# Patient Record
Sex: Female | Born: 1960 | Race: White | Hispanic: No | Smoking: Current every day smoker
Health system: Southern US, Community
[De-identification: ages and names within clinical notes are randomized; demographics above are authoritative.]

## PROBLEM LIST (undated history)

## (undated) DIAGNOSIS — I10 Essential (primary) hypertension: Secondary | ICD-10-CM

## (undated) DIAGNOSIS — J302 Other seasonal allergic rhinitis: Secondary | ICD-10-CM

## (undated) DIAGNOSIS — A0472 Enterocolitis due to Clostridium difficile, not specified as recurrent: Principal | ICD-10-CM

## (undated) DIAGNOSIS — F32A Depression, unspecified: Secondary | ICD-10-CM

## (undated) DIAGNOSIS — K219 Gastro-esophageal reflux disease without esophagitis: Secondary | ICD-10-CM

## (undated) DIAGNOSIS — F419 Anxiety disorder, unspecified: Secondary | ICD-10-CM

## (undated) DIAGNOSIS — F329 Major depressive disorder, single episode, unspecified: Secondary | ICD-10-CM

## (undated) HISTORY — PX: ABDOMINAL HYSTERECTOMY: SHX81

## (undated) HISTORY — DX: Anxiety disorder, unspecified: F41.9

## (undated) HISTORY — DX: Gastro-esophageal reflux disease without esophagitis: K21.9

## (undated) HISTORY — PX: TUBAL LIGATION: SHX77

## (undated) HISTORY — PX: CERVICAL CONIZATION W/BX: SHX1330

## (undated) HISTORY — DX: Depression, unspecified: F32.A

## (undated) HISTORY — DX: Enterocolitis due to Clostridium difficile, not specified as recurrent: A04.72

## (undated) HISTORY — PX: OTHER SURGICAL HISTORY: SHX169

## (undated) HISTORY — DX: Essential (primary) hypertension: I10

## (undated) HISTORY — DX: Other seasonal allergic rhinitis: J30.2

## (undated) HISTORY — DX: Major depressive disorder, single episode, unspecified: F32.9

---

## 1999-11-17 ENCOUNTER — Other Ambulatory Visit: Admission: RE | Admit: 1999-11-17 | Discharge: 1999-11-17 | Payer: Self-pay | Admitting: Obstetrics and Gynecology

## 2000-11-19 ENCOUNTER — Ambulatory Visit (HOSPITAL_COMMUNITY): Admission: RE | Admit: 2000-11-19 | Discharge: 2000-11-19 | Payer: Self-pay | Admitting: Urology

## 2000-11-19 ENCOUNTER — Encounter: Payer: Self-pay | Admitting: Urology

## 2001-04-10 ENCOUNTER — Emergency Department (HOSPITAL_COMMUNITY): Admission: EM | Admit: 2001-04-10 | Discharge: 2001-04-10 | Payer: Self-pay | Admitting: *Deleted

## 2001-04-10 ENCOUNTER — Encounter: Payer: Self-pay | Admitting: *Deleted

## 2001-04-14 ENCOUNTER — Encounter (HOSPITAL_COMMUNITY): Admission: RE | Admit: 2001-04-14 | Discharge: 2001-05-14 | Payer: Self-pay | Admitting: Family Medicine

## 2001-05-12 ENCOUNTER — Other Ambulatory Visit: Admission: RE | Admit: 2001-05-12 | Discharge: 2001-05-12 | Payer: Self-pay | Admitting: Obstetrics and Gynecology

## 2001-08-12 ENCOUNTER — Other Ambulatory Visit: Admission: RE | Admit: 2001-08-12 | Discharge: 2001-08-12 | Payer: Self-pay | Admitting: Obstetrics and Gynecology

## 2001-10-27 ENCOUNTER — Inpatient Hospital Stay (HOSPITAL_COMMUNITY): Admission: AD | Admit: 2001-10-27 | Discharge: 2001-10-27 | Payer: Self-pay | Admitting: Obstetrics & Gynecology

## 2002-01-10 ENCOUNTER — Other Ambulatory Visit: Admission: RE | Admit: 2002-01-10 | Discharge: 2002-01-10 | Payer: Self-pay | Admitting: Obstetrics and Gynecology

## 2002-04-14 ENCOUNTER — Other Ambulatory Visit: Admission: RE | Admit: 2002-04-14 | Discharge: 2002-04-14 | Payer: Self-pay | Admitting: Obstetrics and Gynecology

## 2002-10-30 ENCOUNTER — Other Ambulatory Visit: Admission: RE | Admit: 2002-10-30 | Discharge: 2002-10-30 | Payer: Self-pay | Admitting: Obstetrics and Gynecology

## 2004-05-06 ENCOUNTER — Ambulatory Visit (HOSPITAL_COMMUNITY): Admission: RE | Admit: 2004-05-06 | Discharge: 2004-05-06 | Payer: Self-pay | Admitting: Family Medicine

## 2004-05-08 ENCOUNTER — Ambulatory Visit (HOSPITAL_COMMUNITY): Admission: RE | Admit: 2004-05-08 | Discharge: 2004-05-08 | Payer: Self-pay | Admitting: Family Medicine

## 2004-05-26 ENCOUNTER — Other Ambulatory Visit: Admission: RE | Admit: 2004-05-26 | Discharge: 2004-05-26 | Payer: Self-pay | Admitting: Obstetrics and Gynecology

## 2005-03-30 ENCOUNTER — Inpatient Hospital Stay (HOSPITAL_COMMUNITY): Admission: EM | Admit: 2005-03-30 | Discharge: 2005-04-01 | Payer: Self-pay | Admitting: Emergency Medicine

## 2005-03-31 ENCOUNTER — Ambulatory Visit: Payer: Self-pay | Admitting: Internal Medicine

## 2005-04-21 ENCOUNTER — Ambulatory Visit (HOSPITAL_COMMUNITY): Admission: RE | Admit: 2005-04-21 | Discharge: 2005-04-21 | Payer: Self-pay | Admitting: Internal Medicine

## 2005-07-16 ENCOUNTER — Other Ambulatory Visit: Admission: RE | Admit: 2005-07-16 | Discharge: 2005-07-16 | Payer: Self-pay | Admitting: Obstetrics and Gynecology

## 2005-12-30 ENCOUNTER — Ambulatory Visit (HOSPITAL_COMMUNITY): Admission: RE | Admit: 2005-12-30 | Discharge: 2005-12-30 | Payer: Self-pay | Admitting: Family Medicine

## 2007-08-16 ENCOUNTER — Ambulatory Visit: Payer: Self-pay | Admitting: Internal Medicine

## 2007-09-02 ENCOUNTER — Ambulatory Visit: Payer: Self-pay | Admitting: Internal Medicine

## 2007-09-02 ENCOUNTER — Encounter: Payer: Self-pay | Admitting: Internal Medicine

## 2007-09-02 ENCOUNTER — Ambulatory Visit (HOSPITAL_COMMUNITY): Admission: RE | Admit: 2007-09-02 | Discharge: 2007-09-02 | Payer: Self-pay | Admitting: Internal Medicine

## 2007-09-02 HISTORY — PX: ESOPHAGOGASTRODUODENOSCOPY: SHX1529

## 2007-09-02 HISTORY — PX: COLONOSCOPY: SHX174

## 2007-10-14 ENCOUNTER — Ambulatory Visit (HOSPITAL_COMMUNITY): Admission: RE | Admit: 2007-10-14 | Discharge: 2007-10-14 | Payer: Self-pay | Admitting: Internal Medicine

## 2007-12-19 ENCOUNTER — Encounter (INDEPENDENT_AMBULATORY_CARE_PROVIDER_SITE_OTHER): Payer: Self-pay | Admitting: Obstetrics and Gynecology

## 2007-12-19 ENCOUNTER — Ambulatory Visit (HOSPITAL_COMMUNITY): Admission: RE | Admit: 2007-12-19 | Discharge: 2007-12-20 | Payer: Self-pay | Admitting: Obstetrics and Gynecology

## 2008-11-27 ENCOUNTER — Encounter: Payer: Self-pay | Admitting: Gastroenterology

## 2009-02-19 ENCOUNTER — Ambulatory Visit (HOSPITAL_COMMUNITY): Admission: RE | Admit: 2009-02-19 | Discharge: 2009-02-19 | Payer: Self-pay | Admitting: Family Medicine

## 2010-09-04 ENCOUNTER — Emergency Department (HOSPITAL_COMMUNITY): Payer: BC Managed Care – PPO

## 2010-09-04 ENCOUNTER — Emergency Department (HOSPITAL_COMMUNITY)
Admission: EM | Admit: 2010-09-04 | Discharge: 2010-09-04 | Disposition: A | Discharge status: Home / Self Care | Payer: BC Managed Care – PPO | Attending: Emergency Medicine | Admitting: Emergency Medicine

## 2010-09-04 DIAGNOSIS — M79609 Pain in unspecified limb: Secondary | ICD-10-CM

## 2010-09-04 DIAGNOSIS — I10 Essential (primary) hypertension: Secondary | ICD-10-CM | POA: Insufficient documentation

## 2010-09-04 DIAGNOSIS — R109 Unspecified abdominal pain: Secondary | ICD-10-CM | POA: Insufficient documentation

## 2010-09-04 DIAGNOSIS — Z90710 Acquired absence of both cervix and uterus: Secondary | ICD-10-CM | POA: Insufficient documentation

## 2010-09-04 LAB — URINALYSIS, ROUTINE W REFLEX MICROSCOPIC
Bilirubin Urine: NEGATIVE
Hgb urine dipstick: NEGATIVE
Nitrite: NEGATIVE
Specific Gravity, Urine: 1.022 (ref 1.005–1.030)
Urobilinogen, UA: 0.2 mg/dL (ref 0.0–1.0)
pH: 6 (ref 5.0–8.0)

## 2010-09-04 LAB — DIFFERENTIAL
Basophils Absolute: 0 10*3/uL (ref 0.0–0.1)
Basophils Relative: 0 % (ref 0–1)
Eosinophils Absolute: 0.1 10*3/uL (ref 0.0–0.7)
Monocytes Relative: 6 % (ref 3–12)
Neutro Abs: 6.5 10*3/uL (ref 1.7–7.7)
Neutrophils Relative %: 68 % (ref 43–77)

## 2010-09-04 LAB — COMPREHENSIVE METABOLIC PANEL
ALT: 20 U/L (ref 0–35)
AST: 27 U/L (ref 0–37)
Calcium: 9.4 mg/dL (ref 8.4–10.5)
Creatinine, Ser: 0.63 mg/dL (ref 0.4–1.2)
GFR calc Af Amer: >60 mL/min (ref >60)
GFR calc non Af Amer: >60 mL/min (ref >60)
Sodium: 131 mEq/L — ABNORMAL LOW (ref 135–145)
Total Protein: 7.2 g/dL (ref 6.0–8.3)

## 2010-09-04 LAB — PROTIME-INR
INR: 0.91 (ref 0.00–1.49)
Prothrombin Time: 12.5 seconds (ref 11.6–15.2)

## 2010-09-04 LAB — CBC
Hemoglobin: 12.3 g/dL (ref 12.0–15.0)
Platelets: 231 10*3/uL (ref 150–400)
RBC: 3.68 MIL/uL — ABNORMAL LOW (ref 3.87–5.11)
WBC: 9.6 10*3/uL (ref 4.0–10.5)

## 2010-09-04 LAB — LIPASE, BLOOD: Lipase: 15 U/L (ref 11–59)

## 2010-09-04 MED ORDER — IOHEXOL 300 MG/ML  SOLN
100.0000 mL | Freq: Once | INTRAMUSCULAR | Status: AC | PRN
  Administered 2010-09-04: 100 mL via INTRAVENOUS

## 2010-09-16 NOTE — Op Note (Signed)
[name]            ACCOUNT NO.:  [id number]   MEDICAL RECORD NO.:  [id number]          PATIENT TYPE:  AMB   LOCATION:  DAY                           FACILITY:  [facility]   PHYSICIAN:  R. Roetta Sessions, M.D. DATE OF BIRTH:  Dec 13, 1960   DATE OF PROCEDURE:  09/02/2007  DATE OF DISCHARGE:                               OPERATIVE REPORT   EGD with biopsy followed by ileocolonoscopy screening.   INDICATIONS FOR PROCEDURE:  A 50 year old lady with epigastric pain and  dyspeptic symptoms, recently had some black stools, temporarily related  to taking aspirin powders.  EGD is now being done.  Colonoscopy is now  being done as well.  She has had some diarrhea.  She has the threshold  age for colorectal cancer screening.  Anyway, this part has been  discussed with [name] previously.  Risks, benefits, alternatives,  and limitations have been reviewed.  Questions answered and she is  agreeable.  Please see Dr. __________  medical record.   PROCEDURE NOTE:  O2 saturation, blood pressure, pulse, and respirations  are monitored throughout the entirety of both procedures.   CONSCIOUS SEDATION:  Versed 8 mg IV and Demerol 200 mg IV in divided  doses.  Phenergan 12.5 mg IV __________  IV was pushed to augment  conscious sedation.  Cetacaine spray for topical oropharyngeal  anesthesia.   FINDINGS:  EGD examination of tubular esophagus revealed a 1 x 1 cm  inlet patch in the proximal esophagus.  Otherwise, esophageal mucosa  appeared normal.  EG junction easily traversed __________  stomach.  __________  gastric cavity was emptied and insufflated well with air.  A  thorough examination of the gastric mucosa including retroflexed view of  the proximal stomach, esophagogastric junction demonstrated only a  couple of pre-blocked antral erosions.  Otherwise, the gastric mucosa  appeared entirely normal.  Pylorus was patent and easily traversed.  Examination of the bulb and second portion  revealed no abnormalities.   THERAPEUTIC/DIAGNOSTIC MANEUVERS PERFORMED:  The 1 x 1 cm patch of  __________ biopsy x1 for __________.  The patient tolerated the  procedure well and was prepared for colonoscopy.  Digital rectal exam  revealed no abnormalities.  Endoscopic findings:  Prep was good.  Colon:  Colonic mucosa was surveyed from the rectosigmoid junction through the  left transverse, right colon, appendiceal orifice, ileocecal valve, and  cecum.  These structures were well seen and photographed for the record.  Terminal ileum was intubated to 10 cm.  From this level, the scope was  slowly withdrawn.  Previously mentioned mucosal surfaces were again  seen.  The colonic mucosa as well as terminal ileal mucosa appeared  normal.  Scope was pulled down to the rectum with examination of rectal  mucosa, including retroflexed view of the anal verge, demonstrated only  a couple of anal papilla.  The patient tolerated both procedures well  and was reacted in endoscopy.   IMPRESSION:  Esophagogastroduodenoscopy, solitary inlet patch, status  post biopsy otherwise normal esophagus, antral erosions, otherwise  normal stomach, D1 and D2.  Colonoscopy findings:  Anal papilla  otherwise  normal rectum, colon, terminal ileum.   RECOMMENDATIONS:  1. Continue Kapidex 60 mg orally daily. We will check H. pylori      serologies.  2. The patient was asked to refrain from taking aspirin powders.  3. Further recommendations to follow.      Jonathon Bellows, M.D.  Electronically Signed     RMR/MEDQ  D:  09/02/2007  T:  09/03/2007  Job:  [id number]   cc:   Patrica Duel, M.D.  Fax: [phone]

## 2010-09-16 NOTE — Op Note (Signed)
[name]            ACCOUNT NO.:  [id number]   MEDICAL RECORD NO.:  [id number]          PATIENT TYPE:  OIB   LOCATION:  9303                          FACILITY:  [facility]   PHYSICIAN:  Michelle L. Grewal, M.D.DATE OF BIRTH:  [birth year]   DATE OF PROCEDURE:  12/19/2007  DATE OF DISCHARGE:  12/20/2007                               OPERATIVE REPORT   PREOPERATIVE DIAGNOSES:  Pelvic pain and history of ovarian cyst.   POSTOPERATIVE DIAGNOSES:  Pelvic pain and history of ovarian cyst.   PROCEDURE:  Laparoscopic-assisted vaginal hysterectomy and bilateral  salpingo-oophorectomy.   SURGEON:  Michelle L. Vincente Poli, MD   ASSISTANT:  None.   ANESTHESIA:  General anesthesia.   ESTIMATED BLOOD LOSS:  200 mL.   COMPLICATIONS:  None.   PATHOLOGY:  Uterus, tubes, ovaries and cervix.   DRAINS:  Foley catheter   PROCEDURE:  The patient was taken to the operating room after informed  consent was obtained.  She was then prepped and draped in the usual  sterile fashion.  A Foley catheter was inserted and uterine manipulator  was inserted.  Attention was turned to the abdomen.  A small  infraumbilical incision was made and a Veress needle was inserted.  The  pneumoperitoneum was performed.  The Veress needle was removed.  An 11-  mm trocar was inserted.  The laparoscope was introduced through the  trocar sheath.  A 5-mm suprapubic port was placed under direct  visualization.  Exam of the abdomen and pelvis revealed no evidence of  endometriosis and no adhesions.  I then identified the ureters in their  normal location.  The ovary and tube were grasped on the right side.  The gyrus instrument was placed just beneath the skin and burned and  carried down to the round ligament, and there was no bleeding  whatsoever.  This was done on the left side in identical fashion.  I  noted that the ureters were well away from where my clamps were.  At  this point, there was no bleeding.  We removed  the laparoscope.  I  reduced the pneumoperitoneum.  I then placed weighted-speculum in the  vagina, made a circumferential incision around the cervix, made a  posterior incision and the posterior cul-de-sac and an incision in the  anterior cul-de-sac.  I placed curved Heaney clamps across the  uterosacral cardinal ligaments on either side.  Each pedicle was cut and  suture ligated using 0 Vicryl suture.  I walked my way up the broad  ligament on either side.  Each pedicle was cut and suture ligated using  0 Vicryl suture.  Once we reached the level of the fundus, the uterus  was retroflexed and removed and the remainder of the broad ligament was  clamped on either side.  The specimen was removed and sent to pathology.  The pedicles were secured using a suture ligature of 0 Vicryl suture.  Hemostasis was excellent.  The posterior cuff was closed from 3-9  o'clock in a running locked stitch using 0 Vicryl.  The cuff was then  closed completely anterior to posterior in  running locked stitch using 0  Vicryl suture.  At this point, I then changed my gloves and went back up  to the abdomen.  I have replaced the pneumoperitoneum, irrigated the  pelvis.  Hemostasis  was excellent.  I then removed the instruments from the abdominal  cavity, closed the infraumbilical incision with 3-0 Vicryl and then the  skin incisions were closed with Dermabond skin adhesive.  All sponge,  lap and instrument counts were correct x2.  The patient went to recovery  room in stable condition.      Michelle L. Vincente Poli, M.D.  Electronically Signed     MLG/MEDQ  D:  12/26/2007  T:  12/26/2007  Job:  [id number]

## 2010-09-16 NOTE — Consult Note (Signed)
[name]            ACCOUNT NO.:  [id number]   MEDICAL RECORD NO.:  [id number]         PATIENT TYPE:  AMB   LOCATION:  DAY                           FACILITY:  [facility]   PHYSICIAN:  R. Roetta Sessions, M.D. DATE OF BIRTH:  1960-10-06   DATE OF CONSULTATION:  08/16/2007  DATE OF DISCHARGE:                                 CONSULTATION   REFERRING PHYSICIAN:  Patrica Duel, M.D.   REASON FOR CONSULTATION:  Anemia, questionable colonoscopy.   HISTORY OF PRESENT ILLNESS:  Heather Davidson is a 50 year old Caucasian female  who presents today for further evaluation of anemia.  She presented for  physical exam a few weeks ago.  She noted at that time shehad  had  several-week history of diarrhea.  Workup included O&P, C. diff, and  culture, which were negative.  The patient states her diarrhea has now  resolved.  She is having regular bowel movements, eating Activia daily.  She complains mostly of abdominal bloating and gas.  She has  intermittent gas-like pains.  She has been taking Gas-X without results.  She has tried Zantac as well.  She denies any heartburn.  She does have  a lot of belching.  She also reports several-week history of black  stools.  She states she collected Hemoccults and was told they were  negative.  Her last black stool was a week ago.  She denies any Pepto,  Mylanta, Maalox, or iron therapy.  She does take BC powder once daily.  She takes about 5 per week.  She never had an EGD or colonoscopy.  She  denies dysphagia or odynophagia.  We saw her back in November 2006, when  she presented with bloody stools.  She had a CT at that time, which  revealed pancolitis.  Her stool studies were negative.  She was supposed  to follow up as an outpatient, but she never did.   CURRENT MEDICATIONS:  1. Cozaar 50 mg daily.  2. Generic Zyrtec daily.  3. Xanax 0.5 mg half tablet as needed.  4. B Complex daily.   ALLERGIES:  No known drug allergies.   PAST MEDICAL HISTORY:   She has been treated for precervical cancer with  conization twice.  She has a history of cystic ovaries, anxiety,  hypertension, and seasonal allergies.  She has had a cesarean section  and a bilateral tubal ligation.   FAMILY HISTORY:  Father had lung cancer due to asbestosis.  Mother has  COPD and asthma.  No family history of colorectal cancer.   SOCIAL HISTORY:  She is engaged to be married.  She has 1 child.  She  smokes a pack of cigarettes daily, consumes 2-3 glasses of wine daily.  She works at [employer].   REVIEW OF SYSTEMS:  See HPI for GI.  CONSTITUTIONAL:  Eight-pound weight  gain over the last 1 year.  CARDIOPULMONARY:  No chest pain, shortness  of breath, or cough.  GENITOURINARY:  Her menstrual cycles occur every  21 days with moderate bleeding.  No dysuria or hematuria.   PHYSICAL EXAM:  VITAL SIGNS:  Weight 172.5, height  5 feet 7, temp 98.3,  blood pressure 130/80, and pulse 84.  GENERAL:  Pleasant, well-nourished, well-developed, Caucasian female in  no acute distress.  SKIN:  Warm and dry.  No jaundice.  HEENT:  Sclerae nonicteric.  Oropharyngeal mucosa moist and pink.  No  lesions, erythema, or exudate.  No lymphadenopathy or thyromegaly.  CHEST:  Lungs are clear to auscultation.  CARDIAC:  Reveals regular rate and rhythm.  Normal S1, S2.  No murmurs,  rubs, or gallops.  ABDOMEN:  Positive bowel sounds.  Abdomen is soft.  She has mild  epigastric tenderness to deep palpation.  No rebound or guarding.  No  abdominal bruits or hernias.  No hepatosplenomegaly or masses.  LOWER EXTREMITIES:  No edema.   LABS:  Hemoglobin 11.5; hematocrit 36.1; MCV 105.9; platelets 356,000;  and WBC 7.2.  Sodium 137, potassium 4.4, BUN 9, creatinine 0.66, total  bilirubin 0.2, alkaline phosphatase 72, AST 16, ALT 11, albumin 3.7, and  TSH 2.330.  Ferritin 70, iron 58, TIBC 277, B12 539, and folate 14.1.   IMPRESSION:  The patient is a 50 year old lady with mild anemia as   above.  She reports negative Hemoccults recently.  She has a history of  black stools in the setting of BC powder use, epigastric pain, and  indigestion.  Cannot exclude possibility of peptic ulcer disease.  In  addition, she has a history of pancolitis, which presumably was from an  infectious source, although her stool studies were negative back in  2006.  Given that she is approaching the screening age and has mild  anemia with this abnormality seen previously, Dr. Jena Gauss is recommending  colonoscopy at this time as well too.   PLAN:  1. EGD and colonoscopy in the near future with Dr. Jena Gauss.  I discussed      risks, alternatives, and benefits with the patient.  She is      agreeable to proceed.  2. We will begin proton pump inhibitor therapy, samples provided to      take 1 daily.  She was given Kapidex 60 mg, #20.   Thank you for allowing me to take part in the care of this patient.      Tana Coast, P.AJonathon Bellows, M.D.  Electronically Signed    LL/MEDQ  D:  08/16/2007  T:  08/17/2007  Job:  [id number]   cc:   Patrica Duel, M.D.  Fax: [phone]

## 2010-09-19 NOTE — Consult Note (Signed)
[name]            ACCOUNT NO.:  [id number]   MEDICAL RECORD NO.:  [id number]          PATIENT TYPE:  INP   LOCATION:  A310                          FACILITY:  [facility]   PHYSICIAN:  R. Roetta Sessions, M.D. DATE OF BIRTH:  [birth year]   DATE OF CONSULTATION:  03/30/2005  DATE OF DISCHARGE:                                   CONSULTATION   REASON FOR CONSULTATION:  Colitis.   REQUESTING PHYSICIAN:  Corrie Mckusick, M.D.   HISTORY OF PRESENT ILLNESS:  The patient is a 50 year old Caucasian female  who presented to the emergency department for further evaluation of acute  onset nausea, vomiting and diarrhea with bloody stools. Three days prior to  admission, she began having diarrhea. Within 24 hours, she developed fever  with body aches. Diarrhea persisted for three days. She did have several  episodes of nausea and vomiting a couple of days prior to admission. She  went to see her family physician the day of admission. In the office, she  passed several bloody stools which she described as being only 1 or 2  tablespoons of blood at a time. On rectal examination by [name], nurse  practitioner, she was found to have gross blood per rectum according to the  patient. She was advised to go to the emergency department for further  evaluation. She has had several stools since which were watery red in  appearance. She complaints of abdominal cramping. No further vomiting. She  denies any close ill contacts but found out last night that seven people at  her place of employment also have nausea, vomiting and diarrhea. On a  chronic basis, she has some intermittent constipation and belching. Denies  any significant weight loss. No hematemesis or black stools.   In the ED, her temperature was 100.2. CT of the abdomen and pelvis  preliminary reveals diffuse colonic wall thickening with pericolonic edema  and inflammation consistent with pancolitis. Celiac, SMA, IMA all appeared  to  be patent. White count was 4,200 today. Hemoglobin was 13.5 today to  11.9. Potassium 3.1, sodium 129.   MEDICATIONS PRIOR TO ADMISSION:  1.  Motrin for fever.  2.  Imodium for diarrhea.  3.  Zyrtec D b.i.d.  4.  Flonase p.r.n.  5.  B complex.  6.  Prenatal care vitamin b.i.d.   ALLERGIES:  No known drug allergies.   PAST MEDICAL HISTORY:  1.  Seasonal allergies.  2.  History of precervical cancer status post conization.  3.  Bilateral tubal ligation.  4.  Cesarean section.   FAMILY HISTORY:  Negative for colorectal cancer or IBD.   SOCIAL HISTORY:  She is divorced. Has one child. She smokes one pack of  cigarettes daily. Consumes two to three glasses of wine each night. She  works as a Administrator for [employer].   REVIEW OF SYSTEMS:  See HPI for GI and constitutional. GENITOURINARY:  Last  menstrual cycle started March 27, 2005 which was a few days early for  her. CARDIOPULMONARY:  No chest pain or shortness of breath.   PHYSICAL  EXAMINATION:  VITAL SIGNS:  Temperature 98.5, T-max 100.2, pulse  93, respirations 20, blood pressure 98/65.  GENERAL:  Pleasant, well-developed, well-nourished Caucasian female in no  acute distress.  SKIN:  Warm and dry. No jaundice.  HEENT:  Conjunctivae are pink. Sclerae are nonicteric. Oropharyngeal mucosa  moist and pink. No lesions, erythema or exudate. No lymphadenopathy.  CHEST:  Lungs are clear to auscultation.  CARDIAC:  Reveals regular rate and rhythm. Normal S1 and S2. No murmurs,  rubs, or gallops.  ABDOMEN:  Positive bowel sounds. Soft, nondistended. She has mild epigastric  tenderness to deep palpation. In the right lower quadrant, she appears to be  somewhat more full than on the left side but no obvious mass palpated. No  rebound tenderness or guarding. No abdominal bruits or hernias.  EXTREMITIES:  No edema.   LABORATORY DATA:  As mentioned in HPI. In addition, platelets 162,000, BUN  3,  creatinine 0.8, glucose 94, calcium 7.8.   IMPRESSION:  The patient is a 50 year old Caucasian female who presents with  acute onset nausea, vomiting, diarrhea with bloody stools. CT reveals pan  colitis. Suspect this to be due to infectious etiology rather than IBD given  nature of acute onset.   RECOMMENDATIONS:  1.  Will change Cipro and Flagyl to oral as tolerated.  2.  Clear liquid diet.  3.  Diflucan for vaginal yeast infection.  4.  Levsin sublingual.  5.  Follow up stool studies.  6.  Further recommendations to follow after review of CT films.      Tana Coast, P.AJonathon Bellows, M.D.  Electronically Signed    LL/MEDQ  D:  03/31/2005  T:  03/31/2005  Job:  [id number]

## 2010-09-19 NOTE — H&P (Signed)
NAMECephus Richer            ACCOUNT NO.:  [id number]   MEDICAL RECORD NO.:  [id number]          PATIENT TYPE:  INP   LOCATION:  A310                          FACILITY:  [facility]   PHYSICIAN:  Corrie Mckusick, M.D.  DATE OF BIRTH:  11/13/1960   DATE OF ADMISSION:  03/30/2005  DATE OF DISCHARGE:  LH                                HISTORY & PHYSICAL   ADMITTING DIAGNOSIS:  Colitis.   HISTORY OF PRESENTING ILLNESS:  This is a 50 year old female with really no  significant past medical history who presented to the office earlier in the  day with bright red rectal bleeding, and mixed with yellowish stools.  She  had had the onset of nausea, vomiting, and diarrhea on the Friday prior,  which was 2 days prior to admission.  She had quite a bit of abdominal  cramping.  She had had fevers since Saturday with chills and sweats, as  well.  Her temperature in the office was 100.0.  Again, the diarrhea was  mostly yellowish stools, somewhat watery, but there was some scant bright  red blood.  She had also had quite a bit of achiness involved, as well.   She saw Melony Overly, our P.A. at the office, who discussed the case with  Dr. Sherwood Gambler, and he sent her to the emergency department for further  evaluation.  She also had had decreased p.o. intake, being able to hold down  just scant liquids.  She was mildly hypertension at 96/60 in the office,  which was slightly lower than her baseline blood pressure.  Otherwise,  review of systems is really negative.  She has had no recent travel, no  recent antibiotics, no recent fresh foods, no recent fresh seafood, and has  no significant past medical history as stated above.   REVIEW OF SYSTEMS:  GU and RESPIRATORY review of systems are all negative.   PAST MEDICAL HISTORY:  1.  History of benign tremor.  2.  Seasonal allergies.   PAST SURGICAL HISTORY:  1.  She had a D&C many years ago.  2.  Cesarean section.   SOCIAL HISTORY:  She smokes a pack a  day and 2 glasses of red wine every  other day.   FAMILY HISTORY:  Significant for COPD, ovarian carcinoma in her grandmother.  Otherwise, no significant history of colon pathology.   PHYSICAL EXAMINATION ON ADMISSION:  VITAL SIGNS:  Temperature was 100, blood  pressure 96/60, respirations 18, pulse 82, weight is 156.  GENERAL:  When I saw her in the ER, she was pleasant, talkative and joking,  but clearly not feeling overall her normal chipper self.  HEENT:  Nasopharynx was clear with slightly dry mucous membranes.  NECK:  Supple.  No lymphadenopathy.  CHEST:  Clear to auscultation bilaterally.  CARDIOVASCULAR:  Rate and rhythm normal.  S1 and S2.  No murmurs.  ABDOMEN:  Bowel sounds positive.  There is tenderness in the periumbilical  area.  There is really no rebound or guarding.  No masses palpated.  EXTREMITIES:  No edema.  No tenting of the skin.   CT  of the abdomen showed reportedly colitis by Dr. Rosalia Hammers.  The report is not  officially read yet.  It showed no other pathology.   LABORATORY DATA:  Please see flow sheet for details.   ASSESSMENT AND PLAN:  A 50 year old female with really insignificant past  medical history who presents with most likely a viral gastroenteritis and  colitis.   PLAN:  1.  Admit for IV fluids and aggressive rehydration.  2.  Replace potassium.  3.  She was given Flagyl and Cipro in the emergency department.  These will      be continued for now at 400 of Cipro IV q.12h. and Flagyl 500 mg IV      q.8h.  4.  Will go ahead and consult GI, as she will need a colonoscopy, as she is      nearing the age of 38 anyway.  5.  Will also go ahead and obtain stool for ovum, parasites, cultures and      Clostridium difficile.  6.  Continue to follow closely, and hold her NPO for now.      Corrie Mckusick, M.D.  Electronically Signed     JCG/MEDQ  D:  03/31/2005  T:  03/31/2005  Job:  [id number]

## 2010-09-19 NOTE — Discharge Summary (Signed)
NAMECephus Richer            ACCOUNT NO.:  [id number]   MEDICAL RECORD NO.:  [id number]          PATIENT TYPE:  INP   LOCATION:  A310                          FACILITY:  [facility]   PHYSICIAN:  Corrie Mckusick, M.D.  DATE OF BIRTH:  08-26-1960   DATE OF ADMISSION:  03/30/2005  DATE OF DISCHARGE:  11/29/2006LH                                 DISCHARGE SUMMARY   HISTORY OF PRESENT ILLNESS/PAST MEDICAL HISTORY:  Please see H&P.   HOSPITAL COURSE:  A 50 year old female with no significant past medical  history presented with viral gastroenteritis and colitis.  She is admitted  for IV fluids and aggressive rehydration.  Potassium was replaced.  She was  given Flagyl and Cipro in the emergency department, and we continued that.  GI was consulted.  Stool was obtained for ova and parasites, cultures and C.  difficile.  Please see Dr. Luvenia Starch note for details and agree to their  assessment.   The day after admission, the patient felt remarkably better.  Antibiotics  were continued.  We kept it n.p.o. until GI saw the patient and they decided  to re-feed her.   The following day, on the day of discharge, which was November 29, the  patient had improvement of diarrhea.  Stool studies were negative.  She had  improved quite a bit and was ready for discharge home.  Dr. Jena Gauss agreed  that she should be discharged home and was set up for outpatient total  colonoscopy.   CONDITION ON DISCHARGE:  Improved and stable.   PHYSICAL EXAMINATION:  Please see Dr. Sharyon Medicus note on the day of discharge.   DISCHARGE MEDICATIONS:  1.  Cipro 500 b.i.d. for an additional five days.  2.  Flagyl 500 t.i.d. for five days.  3.  Diflucan 100 mg once.   FOLLOWUP:  She is to follow up for pelvic ultrasound on January 9 at 10 a.m.  She is also to follow up with Dr.  Jena Gauss as scheduled and with [facility] in two weeks.      Corrie Mckusick, M.D.  Electronically Signed     JCG/MEDQ  D:  05/20/2005   T:  05/20/2005  Job:  [id number]

## 2010-09-30 ENCOUNTER — Ambulatory Visit: Payer: BC Managed Care – PPO | Admitting: Gastroenterology

## 2010-09-30 ENCOUNTER — Encounter: Payer: Self-pay | Admitting: Gastroenterology

## 2010-09-30 ENCOUNTER — Ambulatory Visit (INDEPENDENT_AMBULATORY_CARE_PROVIDER_SITE_OTHER): Payer: BC Managed Care – PPO | Admitting: Gastroenterology

## 2010-09-30 VITALS — BP 133/87 | HR 98 | Temp 97.9°F | Ht 67.0 in | Wt 161.2 lb

## 2010-09-30 DIAGNOSIS — K219 Gastro-esophageal reflux disease without esophagitis: Secondary | ICD-10-CM

## 2010-09-30 DIAGNOSIS — R143 Flatulence: Secondary | ICD-10-CM

## 2010-09-30 DIAGNOSIS — R112 Nausea with vomiting, unspecified: Secondary | ICD-10-CM

## 2010-09-30 DIAGNOSIS — R141 Gas pain: Secondary | ICD-10-CM

## 2010-09-30 DIAGNOSIS — R140 Abdominal distension (gaseous): Secondary | ICD-10-CM

## 2010-09-30 DIAGNOSIS — K5900 Constipation, unspecified: Secondary | ICD-10-CM

## 2010-09-30 DIAGNOSIS — R142 Eructation: Secondary | ICD-10-CM

## 2010-09-30 LAB — IFOBT (OCCULT BLOOD): IFOBT: NEGATIVE

## 2010-09-30 MED ORDER — ALIGN 4 MG PO CAPS: 4.0000 mg | ORAL_CAPSULE | Freq: Every day | ORAL | Status: DC

## 2010-09-30 NOTE — Assessment & Plan Note (Signed)
Do not use hyoscyamine at this point since she is not having abdominal cramps or diarrhea. It may only worsen her constipation.

## 2010-09-30 NOTE — Patient Instructions (Signed)
Take Align one daily for four weeks. Samples provided. Take Miralax 17 grams each night (if no good bowel movement that day). You can buy over the counter. Call if two weeks with progress report. If no better, then we will consider abdominal ultrasound to check your gallbladder.  Collect stool specimen and return to our office.

## 2010-09-30 NOTE — Assessment & Plan Note (Signed)
Recent change in PPI therapy. Continue current regimen.

## 2010-09-30 NOTE — Assessment & Plan Note (Signed)
Abdominal bloating, abdominal distention without any significant findings on CT recently. She did have prominent amount of stool in the colon although subjectively she states she's not been constipated. Colonoscopy 3 years ago. Some modest improvement with switch of PPI recently.  Abdominal bloating/gas she provided to the patient. Will begin Align 4 mg daily. #30 samples provided. Admits to some recent constipation over the weekend. Start MiraLax 17 g daily to take each night at bedtime if she's not had a good bowel movement that day. We'll check ifobt. Progress report in 2 weeks. If still symptomatic at that time and having good bowel movements then consider workup of gallbladder.

## 2010-09-30 NOTE — Progress Notes (Signed)
Primary Care Physician:  Elfredia Nevins, MD  Primary Gastroenterologist:  Roetta Sessions, MD  Chief Complaint  Patient presents with  . Abdominal Pain    HPI:  Heather Davidson is a 50 y.o. female here for further evaluation of abdominal pain. She states all her symptoms began back in March when she developed gross hematuria and bladder infection. Saw OB/GYN recently, microscopic hematuria but no evidence of persisting urinary tract infection. She began having bruising throughout upper abdomen, which was spontaneous. No evidence of injury. Platelet count was normal at that time. Her GYN changed her omeprazole to pantoprazole BID for the abdominal bloating which helped swelling initially.   In the interim however she ended up at the emergency Department at [facility].she had a CT that and pelvis with IV and oral contrast which was unremarkable except for large amount of stool throughout the colon. There was nothing seen to explain the upper abdominal bruising. Lab work showed normal PT, PTT, lipase, LFTs, creatinine, CBC. Her urinalysis was negative. Sodium was slightly low at 131.   Now she states the bruising is all gone. She continues to have diffuse bloating and swelling associated with nausea. Two weeks ago, vomiting few times. Extremely constipated this past weekend. Prior to CT, four large BMs. Lately stools very regular, every day (for past 6-8 months). Little brbpr in stool and on tissue paper. Never have heartburn. More belching, but better with pantoprazole. No dysphagia. No odynophagia.    Current Outpatient Prescriptions  Medication Sig Dispense Refill  . citalopram (CELEXA) 20 MG tablet Take 20 mg by mouth daily.        . cyclobenzaprine (FLEXERIL) 10 MG tablet Take 10 mg by mouth 3 (three) times daily as needed.        Marland Kitchen estradiol (ESTRACE) 1 MG tablet Take 1 mg by mouth daily.        . fexofenadine (ALLEGRA) 180 MG tablet Take 180 mg by mouth daily.        . fluticasone  (VERAMYST) 27.5 MCG/SPRAY nasal spray Place 2 sprays into the nose daily.        . hydrochlorothiazide (,MICROZIDE/HYDRODIURIL,) 12.5 MG capsule Take 12.5 mg by mouth daily.        . hyoscyamine (LEVSINEX) 0.375 MG 12 hr capsule Take 0.375 mg by mouth 2 (two) times daily as needed.        Marland Kitchen losartan (COZAAR) 100 MG tablet Take 100 mg by mouth daily.        . pantoprazole (PROTONIX) 40 MG tablet Take 40 mg by mouth daily.        . traMADol (ULTRAM) 50 MG tablet Take 50 mg by mouth every 6 (six) hours as needed.        . Probiotic Product (ALIGN) 4 MG CAPS Take 4 mg by mouth daily.  30 capsule  0    Allergies as of 09/30/2010 - Review Complete 09/30/2010  Allergen Reaction Noted  . Ciprofloxacin  09/30/2010    Past Medical History  Diagnosis Date  . Anxiety   . Depression   . Seasonal allergies   . HTN (hypertension)     Past Surgical History  Procedure Date  . Cesarean section   . Tubal ligation   . Complete hysterectomy   . Cervical conization w/bx     for precervical cancer in remote past  . Esophagogastroduodenoscopy 09/2007    solitary inlet patch (bx neg), antral erosions  . Colonoscopy 09/2007    anal papilla, normal  colon and terminal ileum    Family History  Problem Relation Age of Onset  . Lung cancer Father     asbestosis  . COPD Mother   . Colon cancer Neg Hx     History   Social History  . Marital Status: Married    Spouse Name: N/A    Number of Children: N/A  . Years of Education: N/A   Occupational History  . [employer]    Social History Main Topics  . Smoking status: Current Everyday Smoker -- 1.0 packs/day    Types: Cigarettes  . Smokeless tobacco: Not on file  . Alcohol Use: Yes     3 glass of red wine a night  . Drug Use: No  . Sexually Active: Not on file   Other Topics Concern  . Not on file   Social History Narrative  . No narrative on file      ROS:  General: Negative for anorexia, weight loss, fever, chills, fatigue,  weakness. Eyes: Negative for vision changes.  ENT: Negative for hoarseness, difficulty swallowing , nasal congestion. CV: Negative for chest pain, angina, palpitations, dyspnea on exertion, peripheral edema.  Respiratory: Negative for dyspnea at rest, dyspnea on exertion, cough, sputum, wheezing.  GI: See history of present illness. GU:  Negative for dysuria, hematuria, urinary incontinence, urinary frequency, nocturnal urination.  MS: Negative for joint pain, low back pain.  Derm: Negative for rash or itching.  Neuro: Negative for weakness, abnormal sensation, seizure, frequent headaches, memory loss, confusion.  Psych: Negative for anxiety, depression, suicidal ideation, hallucinations.  Endo: Negative for unusual weight change.  Heme: See HPI. Allergy: Negative for rash or hives.    Physical Examination:  BP 133/87  Pulse 98  Temp(Src) 97.9 F (36.6 C) (Temporal)  Ht 5\' 7"  (1.702 m)  Wt 161 lb 3.2 oz (73.12 kg)  BMI 25.25 kg/m2   General: Well-nourished, well-developed in no acute distress.  Head: Normocephalic, atraumatic.   Eyes: Conjunctiva pink, no icterus. Mouth: Oropharyngeal mucosa moist and pink , no lesions erythema or exudate. Neck: Supple without thyromegaly, masses, or lymphadenopathy.  Lungs: Clear to auscultation bilaterally.  Heart: Regular rate and rhythm, no murmurs rubs or gallops.  Abdomen: Bowel sounds are normal, nontender, slightly distended, no obvious fluid, no bruising present at this time,  no hepatosplenomegaly or masses, no abdominal bruits or    hernia , no rebound or guarding.   Extremities: No lower extremity edema.  Neuro: Alert and oriented x 4 , grossly normal neurologically.  Skin: Warm and dry, no rash or jaundice.   Psych: Alert and cooperative, normal mood and affect.

## 2010-09-30 NOTE — Assessment & Plan Note (Signed)
Recent episode of vomiting associated with abdominal bloating. Please refer to assessment and plan for abdominal bloating.

## 2010-10-01 NOTE — Progress Notes (Signed)
Please let pt know her ifobt was negative. Plan as outlined at time of OV on 09/30/10.

## 2010-10-01 NOTE — Progress Notes (Signed)
LM on personal voicemail with results

## 2010-10-13 ENCOUNTER — Telehealth: Payer: Self-pay | Admitting: Gastroenterology

## 2010-10-13 MED ORDER — ONDANSETRON 4 MG PO TBDP: 4.0000 mg | ORAL_TABLET | ORAL | Status: AC | PRN

## 2010-10-13 NOTE — Telephone Encounter (Signed)
Pt called this morning, she states she is no better- still having abdominal pain associated with nausea and vomiting- please advise on what she should do next- She can be reached at [phone]

## 2010-10-13 NOTE — Telephone Encounter (Addendum)
Please let pt know, we can call in some Zofran 4mg  ODT, dissolve one on tongue every 4 hours as needed for n/v. #20, Zero refills.  Proceed with abd u/s, Dx abd pain, n/v.    RX sent electronically.

## 2010-10-13 NOTE — Telephone Encounter (Signed)
Pt aware, ok to schedule U/S, pt stated she could be reached at her work number or her cell number- [phone]

## 2010-10-14 ENCOUNTER — Other Ambulatory Visit: Payer: Self-pay | Admitting: Internal Medicine

## 2010-10-14 DIAGNOSIS — R112 Nausea with vomiting, unspecified: Secondary | ICD-10-CM

## 2010-10-14 NOTE — Telephone Encounter (Signed)
Pt is scheduled for abdominal U/S on 10/15/10-

## 2010-10-15 ENCOUNTER — Ambulatory Visit (HOSPITAL_COMMUNITY)
Admission: RE | Admit: 2010-10-15 | Discharge: 2010-10-15 | Disposition: A | Discharge status: Home / Self Care | Payer: BC Managed Care – PPO | Source: Ambulatory Visit | Attending: Internal Medicine | Admitting: Internal Medicine

## 2010-10-15 ENCOUNTER — Other Ambulatory Visit (HOSPITAL_COMMUNITY): Payer: BC Managed Care – PPO

## 2010-10-15 DIAGNOSIS — R112 Nausea with vomiting, unspecified: Secondary | ICD-10-CM | POA: Insufficient documentation

## 2010-10-15 DIAGNOSIS — R109 Unspecified abdominal pain: Secondary | ICD-10-CM | POA: Insufficient documentation

## 2010-10-17 ENCOUNTER — Telehealth: Payer: Self-pay | Admitting: Gastroenterology

## 2010-10-17 DIAGNOSIS — R1110 Vomiting, unspecified: Secondary | ICD-10-CM

## 2010-10-17 NOTE — Telephone Encounter (Signed)
Patient actually had abd u/s. For whatever reason, it did not get forwarded to me. Please let her know it was negative.  HIDA with [facility] to finish w/u for gb. Dx Abd pain, vomiting.

## 2010-10-17 NOTE — Telephone Encounter (Signed)
Message copied by Tiffany Kocher on Fri Oct 17, 2010 11:05 AM ------      Message from: Myra Rude      Created: Fri Oct 17, 2010  8:24 AM       Pt called requesting results from ct

## 2010-10-17 NOTE — Telephone Encounter (Signed)
Tried to call pts work [phone]. Left message on voicemail that U/S was normal and Crystal would be calling her to schedule [facility]

## 2010-10-20 NOTE — Telephone Encounter (Signed)
Pt is scheduled for Hida Scan 10/22/10@8 :00am. Pt is aware of appt.

## 2010-10-22 ENCOUNTER — Telehealth: Payer: Self-pay | Admitting: Gastroenterology

## 2010-10-22 ENCOUNTER — Encounter (HOSPITAL_COMMUNITY)
Admission: RE | Admit: 2010-10-22 | Discharge: 2010-10-22 | Disposition: A | Discharge status: Home / Self Care | Payer: BC Managed Care – PPO | Source: Ambulatory Visit | Attending: Gastroenterology | Admitting: Gastroenterology

## 2010-10-22 DIAGNOSIS — R109 Unspecified abdominal pain: Secondary | ICD-10-CM | POA: Insufficient documentation

## 2010-10-22 DIAGNOSIS — R112 Nausea with vomiting, unspecified: Secondary | ICD-10-CM | POA: Insufficient documentation

## 2010-10-22 DIAGNOSIS — R1110 Vomiting, unspecified: Secondary | ICD-10-CM

## 2010-10-22 MED ORDER — REZYST IM PO CHEW: 1.0000 | CHEWABLE_TABLET | Freq: Every day | ORAL | Status: DC

## 2010-10-22 MED ORDER — TECHNETIUM TC 99M MEBROFENIN IV KIT
5.0000 | PACK | Freq: Once | INTRAVENOUS | Status: AC | PRN
  Administered 2010-10-22: 4.8 via INTRAVENOUS

## 2010-10-23 NOTE — Telephone Encounter (Signed)
Sent RX for Rezyst to pharmacy. Should be covered by [employer].

## 2010-11-06 ENCOUNTER — Other Ambulatory Visit (HOSPITAL_COMMUNITY): Payer: Self-pay | Admitting: Family Medicine

## 2010-11-06 ENCOUNTER — Ambulatory Visit (HOSPITAL_COMMUNITY)
Admission: RE | Admit: 2010-11-06 | Discharge: 2010-11-06 | Disposition: A | Discharge status: Home / Self Care | Payer: BC Managed Care – PPO | Source: Ambulatory Visit | Attending: Family Medicine | Admitting: Family Medicine

## 2010-11-06 DIAGNOSIS — M542 Cervicalgia: Secondary | ICD-10-CM | POA: Insufficient documentation

## 2010-11-06 DIAGNOSIS — M47812 Spondylosis without myelopathy or radiculopathy, cervical region: Secondary | ICD-10-CM | POA: Insufficient documentation

## 2010-11-06 DIAGNOSIS — M5030 Other cervical disc degeneration, unspecified cervical region: Secondary | ICD-10-CM

## 2010-11-07 ENCOUNTER — Other Ambulatory Visit (HOSPITAL_COMMUNITY): Payer: Self-pay | Admitting: Family Medicine

## 2010-11-07 DIAGNOSIS — M542 Cervicalgia: Secondary | ICD-10-CM

## 2010-11-10 ENCOUNTER — Ambulatory Visit (HOSPITAL_COMMUNITY)
Admission: RE | Admit: 2010-11-10 | Discharge: 2010-11-10 | Disposition: A | Discharge status: Home / Self Care | Payer: BC Managed Care – PPO | Source: Ambulatory Visit | Attending: Family Medicine | Admitting: Family Medicine

## 2010-11-10 DIAGNOSIS — M79609 Pain in unspecified limb: Secondary | ICD-10-CM | POA: Insufficient documentation

## 2010-11-10 DIAGNOSIS — M5380 Other specified dorsopathies, site unspecified: Secondary | ICD-10-CM | POA: Insufficient documentation

## 2010-11-10 DIAGNOSIS — M5030 Other cervical disc degeneration, unspecified cervical region: Secondary | ICD-10-CM | POA: Insufficient documentation

## 2010-11-10 DIAGNOSIS — M542 Cervicalgia: Secondary | ICD-10-CM | POA: Insufficient documentation

## 2010-11-25 ENCOUNTER — Other Ambulatory Visit: Payer: Self-pay

## 2010-11-25 MED ORDER — REZYST IM PO CHEW: 1.0000 | CHEWABLE_TABLET | Freq: Every day | ORAL | Status: DC

## 2010-11-28 ENCOUNTER — Ambulatory Visit (INDEPENDENT_AMBULATORY_CARE_PROVIDER_SITE_OTHER): Payer: BC Managed Care – PPO | Admitting: Urology

## 2010-11-28 DIAGNOSIS — R3129 Other microscopic hematuria: Secondary | ICD-10-CM

## 2010-12-03 ENCOUNTER — Ambulatory Visit (INDEPENDENT_AMBULATORY_CARE_PROVIDER_SITE_OTHER): Payer: BC Managed Care – PPO | Admitting: Gastroenterology

## 2010-12-03 ENCOUNTER — Encounter: Payer: Self-pay | Admitting: Gastroenterology

## 2010-12-03 DIAGNOSIS — K5900 Constipation, unspecified: Secondary | ICD-10-CM

## 2010-12-03 DIAGNOSIS — K219 Gastro-esophageal reflux disease without esophagitis: Secondary | ICD-10-CM

## 2010-12-03 DIAGNOSIS — R140 Abdominal distension (gaseous): Secondary | ICD-10-CM

## 2010-12-03 DIAGNOSIS — R143 Flatulence: Secondary | ICD-10-CM

## 2010-12-03 DIAGNOSIS — R141 Gas pain: Secondary | ICD-10-CM

## 2010-12-03 DIAGNOSIS — A0472 Enterocolitis due to Clostridium difficile, not specified as recurrent: Secondary | ICD-10-CM

## 2010-12-03 HISTORY — DX: Enterocolitis due to Clostridium difficile, not specified as recurrent: A04.72

## 2010-12-03 MED ORDER — POLYETHYLENE GLYCOL 3350 17 GM/SCOOP PO POWD: 17.0000 g | Freq: Every day | ORAL | Status: AC

## 2010-12-03 MED ORDER — PANTOPRAZOLE SODIUM 40 MG PO TBEC: 40.0000 mg | DELAYED_RELEASE_TABLET | Freq: Two times a day (BID) | ORAL | Status: DC

## 2010-12-03 MED ORDER — REZYST IM PO CHEW: 1.0000 | CHEWABLE_TABLET | Freq: Every day | ORAL | Status: DC

## 2010-12-03 NOTE — Assessment & Plan Note (Signed)
Continue probiotics

## 2010-12-03 NOTE — Progress Notes (Signed)
Primary Care Physician: Colette Ribas, MD  Primary Gastroenterologist:  Roetta Sessions, MD   Chief Complaint  Patient presents with  . Follow-up    has been having diarrhea    HPI: Heather Davidson is a 50 y.o. female here for follow up of constipation, abdominal pain, abdominal bloating. Doing much better now, except extreme constipation last week and took multiple OTC laxatives. Now with six days of loose stool but resolving. Taking a Miralax break for a few days. Rezyst helping bloating. Pantoprazole helping nausea as well. [facility] last week, cystoscopy. No cancer, some polyps, felt hematuria due to bad UTI. No blood in stool on ifobt previously. No heartburn.      Current Outpatient Prescriptions  Medication Sig Dispense Refill  . citalopram (CELEXA) 20 MG tablet Take 20 mg by mouth daily.        . cyclobenzaprine (FLEXERIL) 10 MG tablet Take 10 mg by mouth 3 (three) times daily as needed.        Marland Kitchen estradiol (ESTRACE) 1 MG tablet Take 1 mg by mouth daily.        . fexofenadine (ALLEGRA) 180 MG tablet Take 180 mg by mouth daily.        . fluticasone (VERAMYST) 27.5 MCG/SPRAY nasal spray Place 2 sprays into the nose daily.        . hydrochlorothiazide (,MICROZIDE/HYDRODIURIL,) 12.5 MG capsule Take 12.5 mg by mouth daily.        . hyoscyamine (LEVSINEX) 0.375 MG 12 hr capsule Take 0.375 mg by mouth 2 (two) times daily as needed.        Marland Kitchen losartan (COZAAR) 100 MG tablet Take 100 mg by mouth daily.        . pantoprazole (PROTONIX) 40 MG tablet Take 1 tablet (40 mg total) by mouth 2 (two) times daily.  60 tablet  3  . Probiotic Product (REZYST IM) CHEW Chew 1 tablet by mouth daily.  30 tablet  11  . topiramate (TOPAMAX) 25 MG capsule Take 25 mg by mouth 2 (two) times daily.        . traMADol (ULTRAM) 50 MG tablet Take 50 mg by mouth every 6 (six) hours as needed.          Allergies as of 12/03/2010 - Review Complete 12/03/2010  Allergen Reaction Noted  . Ciprofloxacin   09/30/2010    ROS:  General: Negative for anorexia, weight loss, fever, chills, fatigue, weakness. ENT: Negative for hoarseness, difficulty swallowing , nasal congestion. CV: Negative for chest pain, angina, palpitations, dyspnea on exertion, peripheral edema.  Respiratory: Negative for dyspnea at rest, dyspnea on exertion, cough, sputum, wheezing.  GI: See history of present illness. GU:  Negative for dysuria, hematuria, urinary incontinence, urinary frequency, nocturnal urination.  Endo: Negative for unusual weight change.    Physical Examination:   BP 132/82  Pulse 74  Temp(Src) 97.6 F (36.4 C) (Temporal)  Ht 5\' 6"  (1.676 m)  Wt 157 lb 9.6 oz (71.487 kg)  BMI 25.44 kg/m2  General: Well-nourished, well-developed in no acute distress.  Eyes: No icterus. Mouth: Oropharyngeal mucosa moist and pink , no lesions erythema or exudate. Lungs: Clear to auscultation bilaterally.  Heart: Regular rate and rhythm, no murmurs rubs or gallops.  Abdomen: Bowel sounds are normal, nontender, nondistended, no hepatosplenomegaly or masses, no abdominal bruits or hernia , no rebound or guarding.   Extremities: No lower extremity edema. No clubbing or deformities. Neuro: Alert and oriented x 4   Skin: Warm and  dry, no jaundice.   Psych: Alert and cooperative, normal mood and affect.

## 2010-12-03 NOTE — Assessment & Plan Note (Signed)
Continue pantoprazole 40mg  po bid for next 4-6 weeks then try to get back to once daily.

## 2010-12-03 NOTE — Assessment & Plan Note (Addendum)
Use Miralax 17g daily for constipation. OV prn.

## 2010-12-04 NOTE — Progress Notes (Signed)
Cc to PCP 

## 2010-12-11 NOTE — Progress Notes (Signed)
NEEDS OPV IN 3-4 MOS TO REASESS GERD Sx

## 2010-12-17 ENCOUNTER — Ambulatory Visit (INDEPENDENT_AMBULATORY_CARE_PROVIDER_SITE_OTHER): Payer: BC Managed Care – PPO | Admitting: Gastroenterology

## 2010-12-17 ENCOUNTER — Encounter: Payer: Self-pay | Admitting: Gastroenterology

## 2010-12-17 VITALS — BP 101/70 | HR 84 | Temp 97.4°F | Ht 66.0 in | Wt 155.0 lb

## 2010-12-17 DIAGNOSIS — R197 Diarrhea, unspecified: Secondary | ICD-10-CM | POA: Insufficient documentation

## 2010-12-17 LAB — CBC WITH DIFFERENTIAL/PLATELET
Basophils Absolute: 0 10*3/uL (ref 0.0–0.1)
Eosinophils Absolute: 0.1 10*3/uL (ref 0.0–0.7)
Lymphocytes Relative: 15 % (ref 12–46)
Lymphs Abs: 2.1 10*3/uL (ref 0.7–4.0)
Neutrophils Relative %: 79 % — ABNORMAL HIGH (ref 43–77)
Platelets: 258 10*3/uL (ref 150–400)
RBC: 4 MIL/uL (ref 3.87–5.11)
WBC: 14.1 10*3/uL — ABNORMAL HIGH (ref 4.0–10.5)

## 2010-12-17 MED ORDER — ONDANSETRON 4 MG PO TBDP: 4.0000 mg | ORAL_TABLET | Freq: Four times a day (QID) | ORAL | Status: AC | PRN

## 2010-12-17 NOTE — Progress Notes (Signed)
Primary Care Physician: Colette Ribas, MD  Primary Gastroenterologist:  Roetta Sessions, MD   Chief Complaint  Patient presents with  . Rectal Bleeding    more blood than stool/not feeling good at all    HPI: Heather Davidson is a 50 y.o. female here for urgent office visit. Woke up at 1 AM with diarrhea and abdominal cramps. At first diarrhea. Then start passing mostly blood. Describes fresh blood, moderate volume several times. Bad cramps in left and then to mid-abdomen. Pain into legs. Vomiting, no hematemesis. No solid food today. Drinking water, 8 ounces this afternoon. Husband threw up once this morning. Sunday she had watery diarrhea. Finished steroid yesterday. At this point diarrhea and vomiting have subsided. None since 9 AM. Feels weak. Denies recent ABX use.   Current Outpatient Prescriptions  Medication Sig Dispense Refill  . citalopram (CELEXA) 20 MG tablet Take 20 mg by mouth daily.        . cyclobenzaprine (FLEXERIL) 10 MG tablet Take 10 mg by mouth 3 (three) times daily as needed.        Marland Kitchen estradiol (ESTRACE) 1 MG tablet Take 1 mg by mouth daily.        . fexofenadine (ALLEGRA) 180 MG tablet Take 180 mg by mouth daily.        . fluticasone (VERAMYST) 27.5 MCG/SPRAY nasal spray Place 2 sprays into the nose daily.        . hydrochlorothiazide (,MICROZIDE/HYDRODIURIL,) 12.5 MG capsule Take 12.5 mg by mouth daily.        . hyoscyamine (LEVSINEX) 0.375 MG 12 hr capsule Take 0.375 mg by mouth 2 (two) times daily as needed.        Marland Kitchen losartan (COZAAR) 100 MG tablet Take 100 mg by mouth daily.        . pantoprazole (PROTONIX) 40 MG tablet Take 1 tablet (40 mg total) by mouth 2 (two) times daily.  60 tablet  3  . Probiotic Product (REZYST IM) CHEW Chew 1 tablet by mouth daily.  30 tablet  11  . topiramate (TOPAMAX) 25 MG capsule Take 25 mg by mouth 2 (two) times daily.        . traMADol (ULTRAM) 50 MG tablet Take 50 mg by mouth every 6 (six) hours as needed.        .  ondansetron (ZOFRAN ODT) 4 MG disintegrating tablet Take 1 tablet (4 mg total) by mouth every 6 (six) hours as needed for nausea.  20 tablet  0  . predniSONE (DELTASONE) 10 MG tablet pack Take 10 mg by mouth daily.          Allergies as of 12/17/2010 - Review Complete 12/17/2010  Allergen Reaction Noted  . Ciprofloxacin  09/30/2010   Past Surgical History  Procedure Date  . Cesarean section   . Tubal ligation   . Complete hysterectomy   . Cervical conization w/bx     for precervical cancer in remote past  . Esophagogastroduodenoscopy 09/2007    solitary inlet patch (bx neg), antral erosions  . Colonoscopy 09/2007    anal papilla, normal colon and terminal ileum    ROS:  General: Negative for anorexia, weight loss, fever, chills. ENT: Negative for hoarseness, difficulty swallowing , nasal congestion. CV: Negative for chest pain, angina, palpitations, dyspnea on exertion, peripheral edema.  Respiratory: Negative for dyspnea at rest, dyspnea on exertion, cough, sputum, wheezing.  GI: See history of present illness. GU:  Negative for dysuria, hematuria, urinary incontinence, urinary frequency, nocturnal  urination.  Endo: Negative for unusual weight change.    Physical Examination:   BP 101/70  Pulse 84  Temp(Src) 97.4 F (36.3 C) (Temporal)  Ht 5\' 6"  (1.676 m)  Wt 155 lb (70.308 kg)  BMI 25.02 kg/m2  General: Well-nourished, well-developed in no acute distress.  Eyes: No icterus. Mouth: Oropharyngeal mucosa moist and pink , no lesions erythema or exudate. Lungs: Clear to auscultation bilaterally.  Heart: Regular rate and rhythm, no murmurs rubs or gallops.  Abdomen: Bowel sounds are normal, mild lower abdominal tenderness, nondistended, no hepatosplenomegaly or masses, no abdominal bruits or hernia , no rebound or guarding.   Rectal: No stool in the rectal vault. No blood apparent. No masses. Extremities: No lower extremity edema. No clubbing or deformities. Neuro: Alert  and oriented x 4   Skin: Warm and dry, no jaundice.   Psych: Alert and cooperative, normal mood and affect.

## 2010-12-17 NOTE — Assessment & Plan Note (Signed)
Acute onset bloody diarrhea associated with abdominal cramping and vomiting. Question ischemic colitis versus infectious colitis. Symptoms improved at this point. Would avoid CT scan. Last colonoscopy 3 years ago including normal terminal ileoscopy. We'll send her home with stool containers in case diarrhea returns. Stat CBC. If her white count is elevated may consider CT scan. If at any point she develops worsened abdominal pain, recurrent moderate volume hematochezia or unable to take by mouth and then she should go to the emergency department. She will call tomorrow with a progress report.  Will discuss later with Dr. Jena Gauss regarding any need for repeat colonoscopy based on clinical course.

## 2010-12-18 ENCOUNTER — Ambulatory Visit (HOSPITAL_COMMUNITY)
Admission: RE | Admit: 2010-12-18 | Discharge: 2010-12-18 | Disposition: A | Discharge status: Home / Self Care | Payer: BC Managed Care – PPO | Source: Ambulatory Visit | Attending: Internal Medicine | Admitting: Internal Medicine

## 2010-12-18 ENCOUNTER — Telehealth: Payer: Self-pay

## 2010-12-18 DIAGNOSIS — R109 Unspecified abdominal pain: Secondary | ICD-10-CM | POA: Insufficient documentation

## 2010-12-18 DIAGNOSIS — R197 Diarrhea, unspecified: Secondary | ICD-10-CM

## 2010-12-18 DIAGNOSIS — R933 Abnormal findings on diagnostic imaging of other parts of digestive tract: Secondary | ICD-10-CM | POA: Insufficient documentation

## 2010-12-18 DIAGNOSIS — R112 Nausea with vomiting, unspecified: Secondary | ICD-10-CM | POA: Insufficient documentation

## 2010-12-18 DIAGNOSIS — K921 Melena: Secondary | ICD-10-CM

## 2010-12-18 MED ORDER — IOHEXOL 300 MG/ML  SOLN
100.0000 mL | Freq: Once | INTRAMUSCULAR | Status: AC | PRN
  Administered 2010-12-18: 100 mL via INTRAVENOUS

## 2010-12-18 NOTE — Telephone Encounter (Signed)
Quick Note:  Pt aware, rx's called to [facility] pharmacy ______

## 2010-12-18 NOTE — Telephone Encounter (Signed)
Spoke with LSL, pt needs stat ct abd/pelvis with contrast and a creatinine.

## 2010-12-18 NOTE — Telephone Encounter (Signed)
Quick Note:  Discussed findings with Dr. Tyron Russell, Dr. Jena Gauss. Discussed with patient. She is having bloody mucousy stools today. Small to moderate volume. Wants to stay outpatient.   Please call patient and let her know the following plan: 1. Cipro 500mg  po bid for 14 days and Flagyl 500mg  tid for 14 days (take with food). She lists intolerance of vomiting with cipro but she can try with zofran (given to her yesterday, patient agrees). 2. Clear liquid diet, advance to soft low-residue/no dairy as tolerated. 3. Push liquids.  4. Go to ED if persistent rectal bleeding, increased bleeding, fever >101.5 uncontrolled with Tylenol, worsening abdominal pain. 5. Keep [place] informed of how she is doing. Call tomorrow morning.  6. We will follow-up stool studies. If she has neg studies and/or doesn't improve, then she get TCS. ______

## 2010-12-18 NOTE — Progress Notes (Signed)
Cc to PCP 

## 2010-12-18 NOTE — Progress Notes (Signed)
Quick Note:  WBC up some. Pt with increased pain and bleeding this morning. Suspect ischemic colitis vs infectious colitis. STAT CT a/p with contrast. Await results. ______

## 2010-12-18 NOTE — Telephone Encounter (Signed)
Pt called with progress report this am. She stated she did ok last night and was able to eat chicken noodle soup and keep it down but got up this am and had a lot of blood in her stool, diarrhea and abd cramps. She was able to get stool sample and her husband is going to take it to the lab this am. pts WBC was 14.1. Pt states she feels terrible. Please advise.

## 2010-12-19 ENCOUNTER — Telehealth: Payer: Self-pay

## 2010-12-19 LAB — GIARDIA/CRYPTOSPORIDIUM (EIA)
Cryptosporidium Screen (EIA): NEGATIVE
Giardia Screen (EIA): NEGATIVE

## 2010-12-19 NOTE — Telephone Encounter (Signed)
Patient called back and want to know if she should stay out until the stool studies come back and if so she has been out of work for three days so therefor she will need FMLA papers filed out.

## 2010-12-19 NOTE — Telephone Encounter (Signed)
Glad no further bleeding. Should take it easy for few days. Agree with staying out of work at least through Monday. She should drop off FMLA papers for [place] to complete. PR on Monday.

## 2010-12-19 NOTE — Telephone Encounter (Signed)
Patient called to give [place] a PR on how she was doing. She still has diarrhea but no blood but no as bad as before. Can eat chicken noddle soup, lime shebert, and Heather Davidson. She was unable to go to work today(Friday) so she will need a note for work.

## 2010-12-19 NOTE — Telephone Encounter (Signed)
Called patient and told her to take it easy and to drop off FMLA papers on Monday.

## 2010-12-22 NOTE — Progress Notes (Signed)
Quick Note:  Late entry: I discussed results with patient Friday 8/17 at 3pm. ______

## 2010-12-23 ENCOUNTER — Other Ambulatory Visit: Payer: Self-pay | Admitting: Physical Medicine and Rehabilitation

## 2010-12-23 DIAGNOSIS — M5126 Other intervertebral disc displacement, lumbar region: Secondary | ICD-10-CM

## 2010-12-23 NOTE — Progress Notes (Signed)
Quick Note:  FMLA and work note at front desk ______

## 2010-12-23 NOTE — Progress Notes (Signed)
Quick Note:  Please provide out of work note from 12/17/10 through 12/28/10. FMLA papers are ready. ______

## 2010-12-23 NOTE — Progress Notes (Signed)
Quick Note:  Also, please let her know, per Dr. Jena Gauss, as long as her symptoms resolve, she will not need another colonoscopy. ______

## 2010-12-24 ENCOUNTER — Ambulatory Visit
Admission: RE | Admit: 2010-12-24 | Discharge: 2010-12-24 | Disposition: A | Discharge status: Home / Self Care | Payer: BC Managed Care – PPO | Source: Ambulatory Visit | Attending: Physical Medicine and Rehabilitation | Admitting: Physical Medicine and Rehabilitation

## 2010-12-24 DIAGNOSIS — M5126 Other intervertebral disc displacement, lumbar region: Secondary | ICD-10-CM

## 2011-01-01 ENCOUNTER — Encounter: Payer: Self-pay | Admitting: Gastroenterology

## 2011-01-01 ENCOUNTER — Ambulatory Visit (INDEPENDENT_AMBULATORY_CARE_PROVIDER_SITE_OTHER): Payer: BC Managed Care – PPO | Admitting: Gastroenterology

## 2011-01-01 VITALS — BP 136/75 | HR 80 | Temp 97.8°F | Ht 66.0 in | Wt 153.4 lb

## 2011-01-01 DIAGNOSIS — A0472 Enterocolitis due to Clostridium difficile, not specified as recurrent: Secondary | ICD-10-CM | POA: Insufficient documentation

## 2011-01-01 NOTE — Assessment & Plan Note (Signed)
Doing better. Continue probiotics. Complete Flagyl. If loose stools persist then recheck for CDiff. Suspect continued improvement. OV prn. Return to work on 01/06/11. FMLA papers adjusted.

## 2011-01-01 NOTE — Progress Notes (Signed)
Primary Care Physician: Colette Ribas, MD  Primary Gastroenterologist:  Roetta Sessions, MD   Chief Complaint  Patient presents with  . Follow-up    doing better    HPI: Heather Davidson is a 50 y.o. female here for f/u. Recent colitis on CT, CDiff PCR was positive. Has one more day of Flagyl. At baseline has constipation with intermittent loose stool. With CDiff she had bloody diarrhea. Doing much better. Tends to have several stools every morning but does well the remainder of the day. Started eating regular food. No abdominal pain. This morning four to five loose stools. Small amount of formed stool but not much. Still out of work.   Current Outpatient Prescriptions  Medication Sig Dispense Refill  . citalopram (CELEXA) 20 MG tablet Take 20 mg by mouth daily.        . cyclobenzaprine (FLEXERIL) 10 MG tablet Take 10 mg by mouth 3 (three) times daily as needed.        Marland Kitchen estradiol (ESTRACE) 1 MG tablet Take 1 mg by mouth daily.        . fexofenadine (ALLEGRA) 180 MG tablet Take 180 mg by mouth daily.        . fluticasone (VERAMYST) 27.5 MCG/SPRAY nasal spray Place 2 sprays into the nose daily.        . hydrochlorothiazide (,MICROZIDE/HYDRODIURIL,) 12.5 MG capsule Take 12.5 mg by mouth daily.        . hyoscyamine (LEVSINEX) 0.375 MG 12 hr capsule Take 0.375 mg by mouth 2 (two) times daily as needed.        Marland Kitchen losartan (COZAAR) 100 MG tablet Take 100 mg by mouth daily.        . metroNIDAZOLE (FLAGYL) 500 MG tablet Take 500 mg by mouth.        . pantoprazole (PROTONIX) 40 MG tablet Take 1 tablet (40 mg total) by mouth 2 (two) times daily.  60 tablet  3  . predniSONE (DELTASONE) 10 MG tablet pack Take 10 mg by mouth daily.        . Probiotic Product (REZYST IM) CHEW Chew 1 tablet by mouth daily.  30 tablet  11  . topiramate (TOPAMAX) 25 MG capsule Take 25 mg by mouth 2 (two) times daily.        . traMADol (ULTRAM) 50 MG tablet Take 50 mg by mouth every 6 (six) hours as needed.           Allergies as of 01/01/2011 - Review Complete 01/01/2011  Allergen Reaction Noted  . Ciprofloxacin Nausea And Vomiting 09/30/2010    ROS:  General: Negative for anorexia, weight loss, fever, chills, fatigue, weakness. ENT: Negative for hoarseness, difficulty swallowing , nasal congestion. CV: Negative for chest pain, angina, palpitations, dyspnea on exertion, peripheral edema.  Respiratory: Negative for dyspnea at rest, dyspnea on exertion, cough, sputum, wheezing.  GI: See history of present illness. GU:  Negative for dysuria, hematuria, urinary incontinence, urinary frequency, nocturnal urination.  Endo: Negative for unusual weight change.    Physical Examination:   BP 136/75  Pulse 80  Temp(Src) 97.8 F (36.6 C) (Temporal)  Ht 5\' 6"  (1.676 m)  Wt 153 lb 6.4 oz (69.582 kg)  BMI 24.76 kg/m2  General: Well-nourished, well-developed in no acute distress.  Eyes: No icterus. Mouth: Oropharyngeal mucosa moist and pink , no lesions erythema or exudate. Lungs: Clear to auscultation bilaterally.  Heart: Regular rate and rhythm, no murmurs rubs or gallops.  Abdomen: Bowel sounds are normal, nontender,  nondistended, no hepatosplenomegaly or masses, no abdominal bruits or hernia , no rebound or guarding.   Extremities: No lower extremity edema. No clubbing or deformities. Neuro: Alert and oriented x 4   Skin: Warm and dry, no jaundice.   Psych: Alert and cooperative, normal mood and affect.

## 2011-01-01 NOTE — Patient Instructions (Signed)
If your bowel function does not return to your normal baseline, please check another stool for CDiff. Follow-up with [place] as needed.

## 2011-01-06 NOTE — Progress Notes (Signed)
Cc to PCP 

## 2011-01-15 ENCOUNTER — Telehealth: Payer: Self-pay

## 2011-01-15 ENCOUNTER — Other Ambulatory Visit: Payer: Self-pay | Admitting: Gastroenterology

## 2011-01-15 DIAGNOSIS — R197 Diarrhea, unspecified: Secondary | ICD-10-CM

## 2011-01-15 NOTE — Telephone Encounter (Signed)
Called pt- LM with details. Lab order and container at front desk. Also faxed order to [facility]

## 2011-01-15 NOTE — Telephone Encounter (Signed)
Recheck CDiff PCR.

## 2011-01-15 NOTE — Telephone Encounter (Signed)
Pt called- she would like to go see her mother at [facility]isted living, but she is afraid that she may still be contagious. She is still having several episodes of diarrhea. No fever, no blood in stool.  Pt wants to know if she should be retested for cdiff? Please advise.

## 2011-01-16 ENCOUNTER — Other Ambulatory Visit: Payer: Self-pay | Admitting: Gastroenterology

## 2011-01-19 LAB — CLOSTRIDIUM DIFFICILE BY PCR: Toxigenic C. Difficile by PCR: NOT DETECTED

## 2011-01-20 NOTE — Progress Notes (Signed)
Quick Note:  Please let pt know her CDiff came back negative. ______

## 2011-02-04 ENCOUNTER — Telehealth: Payer: Self-pay | Admitting: Internal Medicine

## 2011-02-04 NOTE — Telephone Encounter (Signed)
Patient is still having some diarrhea not eating solid foods quit yet. Patient wants to know if it would be ok to take an anti-diarrheal medication to help with the diarrhea?

## 2011-02-04 NOTE — Telephone Encounter (Signed)
Can take Imodium 2mg  no more than bid as needed. Continue probiotic. If diarrhea does not improve, or she does not get back to baseline, we will check stool for CDiff one more time.

## 2011-02-05 NOTE — Telephone Encounter (Signed)
LM on personal voicemail with details.

## 2011-06-22 ENCOUNTER — Other Ambulatory Visit: Payer: Self-pay

## 2011-06-22 MED ORDER — PANTOPRAZOLE SODIUM 40 MG PO TBEC: 40.0000 mg | DELAYED_RELEASE_TABLET | Freq: Two times a day (BID) | ORAL | Status: DC

## 2011-12-14 ENCOUNTER — Other Ambulatory Visit: Payer: Self-pay

## 2011-12-14 MED ORDER — REZYST IM PO CHEW: 1.0000 | CHEWABLE_TABLET | Freq: Every day | ORAL | Status: DC

## 2012-02-12 ENCOUNTER — Other Ambulatory Visit: Payer: Self-pay

## 2012-02-12 MED ORDER — PANTOPRAZOLE SODIUM 40 MG PO TBEC: 40.0000 mg | DELAYED_RELEASE_TABLET | Freq: Two times a day (BID) | ORAL | Status: DC

## 2012-02-12 NOTE — Telephone Encounter (Signed)
Please schedule pt appt.  

## 2012-02-12 NOTE — Telephone Encounter (Signed)
Patient needs ov to reassess gerd. Will give RX for BID Protonix for one month supply.

## 2012-02-15 ENCOUNTER — Encounter: Payer: Self-pay | Admitting: Gastroenterology

## 2012-02-15 NOTE — Telephone Encounter (Signed)
Pt is aware of OV on 10/31 @ 1030 with [facility] and appt card was mailed

## 2012-03-03 ENCOUNTER — Ambulatory Visit: Payer: BC Managed Care – PPO | Admitting: Gastroenterology

## 2012-03-08 ENCOUNTER — Ambulatory Visit (INDEPENDENT_AMBULATORY_CARE_PROVIDER_SITE_OTHER): Payer: BC Managed Care – PPO | Admitting: Urgent Care

## 2012-03-08 ENCOUNTER — Ambulatory Visit: Payer: BC Managed Care – PPO | Admitting: Gastroenterology

## 2012-03-08 ENCOUNTER — Encounter: Payer: Self-pay | Admitting: Urgent Care

## 2012-03-08 VITALS — BP 134/83 | HR 85 | Temp 97.4°F | Ht 67.0 in | Wt 160.0 lb

## 2012-03-08 DIAGNOSIS — K5900 Constipation, unspecified: Secondary | ICD-10-CM

## 2012-03-08 DIAGNOSIS — K219 Gastro-esophageal reflux disease without esophagitis: Secondary | ICD-10-CM

## 2012-03-08 MED ORDER — REZYST IM PO CHEW: 1.0000 | CHEWABLE_TABLET | Freq: Every day | ORAL | Status: DC

## 2012-03-08 MED ORDER — PANTOPRAZOLE SODIUM 40 MG PO TBEC: 40.0000 mg | DELAYED_RELEASE_TABLET | Freq: Every day | ORAL | Status: DC

## 2012-03-08 NOTE — Progress Notes (Signed)
Faxed to PCP

## 2012-03-08 NOTE — Telephone Encounter (Signed)
Opened in error

## 2012-03-08 NOTE — Assessment & Plan Note (Addendum)
Well-controlled on protonix 40mg  daily.  Continue this regimen.  1-800-QUIT-NOW for help quitting smoking

## 2012-03-08 NOTE — Progress Notes (Signed)
Referring Provider: Assunta Found, MD Primary Care Physician:  Colette Ribas, MD Primary Gastroenterologist:  Dr. Jena Gauss  Chief Complaint  Patient presents with  . Follow-up    GERD    HPI:  Heather Davidson is a 51 y.o. female here for follow up for GERD.  Hx c diff colitis last year.  She is doing well.  Taking protonix 40mg  daily for acid reflux.   Denies heartburn, indigestion, nausea, vomiting, dysphagia, odynophagia or anorexia.  Denies constipation, diarrhea, rectal bleeding, melena or weight loss.  Past Medical History  Diagnosis Date  . Anxiety   . Depression   . Seasonal allergies   . HTN (hypertension)   . Clostridium difficile colitis 12/2010    u  . GERD (gastroesophageal reflux disease)     Past Surgical History  Procedure Date  . Cesarean section   . Tubal ligation   . Complete hysterectomy   . Cervical conization w/bx     for precervical cancer in remote past  . Esophagogastroduodenoscopy 09/2007    solitary inlet patch (bx neg), antral erosions  . Colonoscopy 09/2007    anal papilla, normal colon and terminal ileum    Current Outpatient Prescriptions  Medication Sig Dispense Refill  . citalopram (CELEXA) 20 MG tablet Take 20 mg by mouth daily.        . cyclobenzaprine (FLEXERIL) 10 MG tablet Take 10 mg by mouth 3 (three) times daily as needed.        Marland Kitchen estradiol (ESTRACE) 1 MG tablet Take 1 mg by mouth daily.        . fexofenadine (ALLEGRA) 180 MG tablet Take 180 mg by mouth daily.        . fluticasone (VERAMYST) 27.5 MCG/SPRAY nasal spray Place 2 sprays into the nose daily.        . hydrochlorothiazide (,MICROZIDE/HYDRODIURIL,) 12.5 MG capsule Take 12.5 mg by mouth daily.        Marland Kitchen losartan (COZAAR) 100 MG tablet Take 100 mg by mouth daily.        . montelukast (SINGULAIR) 10 MG tablet Take 10 mg by mouth at bedtime.       . pantoprazole (PROTONIX) 40 MG tablet Take 1 tablet (40 mg total) by mouth daily.  31 tablet  11  . Probiotic Product (REZYST  IM) CHEW Chew 1 tablet by mouth daily.  30 tablet  11  . RESTASIS 0.05 % ophthalmic emulsion Place 1 drop into both eyes 2 (two) times daily.      Marland Kitchen topiramate (TOPAMAX) 25 MG capsule Take 25 mg by mouth 2 (two) times daily.        . traMADol (ULTRAM) 50 MG tablet Take 50 mg by mouth every 6 (six) hours as needed.        . [DISCONTINUED] pantoprazole (PROTONIX) 40 MG tablet Take 1 tablet (40 mg total) by mouth 2 (two) times daily.  60 tablet  0  . [DISCONTINUED] pantoprazole (PROTONIX) 40 MG tablet Take 40 mg by mouth daily.        Allergies as of 03/08/2012 - Review Complete 03/08/2012  Allergen Reaction Noted  . Ciprofloxacin Nausea And Vomiting 09/30/2010    Review of Systems: Gen: Denies any fever, chills, sweats, anorexia, fatigue, weakness, malaise, weight loss, and sleep disorder CV: Denies chest pain, angina, palpitations, syncope, orthopnea, PND, peripheral edema, and claudication. Resp: Denies dyspnea at rest, dyspnea with exercise, cough, sputum, wheezing, coughing up blood, and pleurisy. GI: Denies vomiting blood, jaundice, and fecal incontinence.  Denies dysphagia or odynophagia. Derm: Denies rash, itching, dry skin, hives, moles, warts, or unhealing ulcers.  Psych: Denies depression, anxiety, memory loss, suicidal ideation, hallucinations, paranoia, and confusion. Heme: Denies bruising, bleeding, and enlarged lymph nodes.  Physical Exam: BP 134/83  Pulse 85  Temp 97.4 F (36.3 C) (Temporal)  Ht 5\' 7"  (1.702 m)  Wt 160 lb (72.576 kg)  BMI 25.06 kg/m2 General:   Alert,  Well-developed, well-nourished, pleasant and cooperative in NAD Eyes:  Sclera clear, no icterus.   Conjunctiva pink. Mouth:  No deformity or lesions, oropharynx pink and moist. Neck:  Supple; no masses or thyromegaly. Heart:  Regular rate and rhythm; no murmurs, clicks, rubs,  or gallops. Abdomen:  Normal bowel sounds.  No bruits.  Soft, non-tender and non-distended without masses, hepatosplenomegaly or  hernias noted.  No guarding or rebound tenderness.   Rectal:  Deferred.  Msk:  Symmetrical without gross deformities.  Pulses:  Normal pulses noted. Extremities:  No clubbing or edema. Neurologic:  Alert and oriented x4;  grossly normal neurologically. Skin:  Intact without significant lesions or rashes.

## 2012-03-08 NOTE — Assessment & Plan Note (Addendum)
Doing well on Rezyst.  Advised to continue probiotics so long as she sees a benefit.

## 2012-03-08 NOTE — Patient Instructions (Addendum)
Continue Rezyst probiotics daily Continue Protonix 40mg  daily for acid reflux Office visit in 1 yr, call sooner if needed.  1-800-QUIT-NOW for help quitting smoking

## 2012-04-19 ENCOUNTER — Other Ambulatory Visit: Payer: Self-pay | Admitting: Obstetrics and Gynecology

## 2012-05-17 ENCOUNTER — Other Ambulatory Visit: Payer: Self-pay

## 2012-05-17 NOTE — Telephone Encounter (Signed)
Please call pharm.  11 RFs given 03/08/12. Thanks

## 2012-10-21 ENCOUNTER — Emergency Department (HOSPITAL_COMMUNITY): Payer: Self-pay

## 2012-10-21 ENCOUNTER — Encounter (HOSPITAL_COMMUNITY): Payer: Self-pay

## 2012-10-21 ENCOUNTER — Emergency Department (HOSPITAL_COMMUNITY)
Admission: EM | Admit: 2012-10-21 | Discharge: 2012-10-21 | Disposition: A | Discharge status: Home / Self Care | Payer: Self-pay | Attending: Emergency Medicine | Admitting: Emergency Medicine

## 2012-10-21 DIAGNOSIS — W108XXA Fall (on) (from) other stairs and steps, initial encounter: Secondary | ICD-10-CM | POA: Insufficient documentation

## 2012-10-21 DIAGNOSIS — Z79899 Other long term (current) drug therapy: Secondary | ICD-10-CM | POA: Insufficient documentation

## 2012-10-21 DIAGNOSIS — I10 Essential (primary) hypertension: Secondary | ICD-10-CM | POA: Insufficient documentation

## 2012-10-21 DIAGNOSIS — Y9389 Activity, other specified: Secondary | ICD-10-CM | POA: Insufficient documentation

## 2012-10-21 DIAGNOSIS — K219 Gastro-esophageal reflux disease without esophagitis: Secondary | ICD-10-CM | POA: Insufficient documentation

## 2012-10-21 DIAGNOSIS — F411 Generalized anxiety disorder: Secondary | ICD-10-CM | POA: Insufficient documentation

## 2012-10-21 DIAGNOSIS — S20219A Contusion of unspecified front wall of thorax, initial encounter: Secondary | ICD-10-CM | POA: Insufficient documentation

## 2012-10-21 DIAGNOSIS — F329 Major depressive disorder, single episode, unspecified: Secondary | ICD-10-CM | POA: Insufficient documentation

## 2012-10-21 DIAGNOSIS — Y92009 Unspecified place in unspecified non-institutional (private) residence as the place of occurrence of the external cause: Secondary | ICD-10-CM | POA: Insufficient documentation

## 2012-10-21 DIAGNOSIS — Z8619 Personal history of other infectious and parasitic diseases: Secondary | ICD-10-CM | POA: Insufficient documentation

## 2012-10-21 DIAGNOSIS — R062 Wheezing: Secondary | ICD-10-CM | POA: Insufficient documentation

## 2012-10-21 DIAGNOSIS — S20211A Contusion of right front wall of thorax, initial encounter: Secondary | ICD-10-CM

## 2012-10-21 DIAGNOSIS — F3289 Other specified depressive episodes: Secondary | ICD-10-CM | POA: Insufficient documentation

## 2012-10-21 DIAGNOSIS — Z8719 Personal history of other diseases of the digestive system: Secondary | ICD-10-CM | POA: Insufficient documentation

## 2012-10-21 DIAGNOSIS — F17200 Nicotine dependence, unspecified, uncomplicated: Secondary | ICD-10-CM | POA: Insufficient documentation

## 2012-10-21 DIAGNOSIS — S93409A Sprain of unspecified ligament of unspecified ankle, initial encounter: Secondary | ICD-10-CM | POA: Insufficient documentation

## 2012-10-21 MED ORDER — HYDROCODONE-ACETAMINOPHEN 5-325 MG PO TABS
1.0000 | ORAL_TABLET | Freq: Once | ORAL | Status: AC
  Administered 2012-10-21: 1 via ORAL
  Filled 2012-10-21: qty 1

## 2012-10-21 MED ORDER — HYDROCODONE-ACETAMINOPHEN 5-325 MG PO TABS: 1.0000 | ORAL_TABLET | ORAL | Status: DC | PRN

## 2012-10-21 NOTE — ED Notes (Signed)
Fell down 3-4 steps last night, pain rt lat ribs, lt ankle and foot.  No HI, alert, talking.

## 2012-10-21 NOTE — ED Notes (Signed)
Pt states she fell down three steps last night. Complain of pain in left ankle and right ribs

## 2012-10-22 NOTE — ED Provider Notes (Signed)
History     CSN: [id number]  Arrival date & time 10/21/12  1147   First MD Initiated Contact with Patient 10/21/12 1230      Chief Complaint  Patient presents with  . Ankle Pain    (Consider location/radiation/quality/duration/timing/severity/associated sxs/prior treatment) HPI Comments: Heather Davidson is a 52 y.o. Female presenting with left ankle pain and swelling along with right rib pain since falling last night.  She was attempting to step off her neighbors front porch which was not lit when she missed a step and tumbled down the remaining 3 brick steps.  She twisted her left ankle when she fell,  And is unsure how she injured her ribs.  She reports constant pain in the ankle,  Worse with weight bearing,  Better with ice and elevation.  Her ribcage pain is also worse with palpation and with deep inspiration,  But denies shortness of breath.  She denies other injury,  She did not hit her head.     The history is provided by the patient and the spouse.    Past Medical History  Diagnosis Date  . Anxiety   . Depression   . Seasonal allergies   . HTN (hypertension)   . Clostridium difficile colitis 12/2010    u  . GERD (gastroesophageal reflux disease)     Past Surgical History  Procedure Laterality Date  . Cesarean section    . Tubal ligation    . Complete hysterectomy    . Cervical conization w/bx      for precervical cancer in remote past  . Esophagogastroduodenoscopy  09/2007    solitary inlet patch (bx neg), antral erosions  . Colonoscopy  09/2007    anal papilla, normal colon and terminal ileum    Family History  Problem Relation Age of Onset  . Lung cancer Father     asbestosis  . COPD Mother   . Colon cancer Neg Hx     History  Substance Use Topics  . Smoking status: Current Every Day Smoker -- 1.00 packs/day for 25 years    Types: Cigarettes  . Smokeless tobacco: Not on file  . Alcohol Use: Yes     Comment: 3 glass of red wine a night    OB  History   Grav Para Term Preterm Abortions TAB SAB Ect Mult Living                  Review of Systems  Constitutional: Negative for fever.  Respiratory: Positive for wheezing. Negative for cough, shortness of breath and stridor.   Cardiovascular: Positive for chest pain.  Musculoskeletal: Positive for joint swelling and arthralgias. Negative for myalgias.  Neurological: Negative for weakness, numbness and headaches.    Allergies  Ciprofloxacin  Home Medications   Current Outpatient Rx  Name  Route  Sig  Dispense  Refill  . cetirizine (ZYRTEC) 10 MG tablet   Oral   Take 10 mg by mouth daily.         . citalopram (CELEXA) 20 MG tablet   Oral   Take 20 mg by mouth daily.           Marland Kitchen estradiol (ESTRACE) 1 MG tablet   Oral   Take 1 mg by mouth daily.           . fluticasone (VERAMYST) 27.5 MCG/SPRAY nasal spray   Nasal   Place 2 sprays into the nose daily.           Marland Kitchen  hydrochlorothiazide (,MICROZIDE/HYDRODIURIL,) 12.5 MG capsule   Oral   Take 12.5 mg by mouth daily.           Marland Kitchen losartan (COZAAR) 100 MG tablet   Oral   Take 100 mg by mouth daily.           . montelukast (SINGULAIR) 10 MG tablet   Oral   Take 10 mg by mouth at bedtime.          . pantoprazole (PROTONIX) 40 MG tablet   Oral   Take 1 tablet (40 mg total) by mouth daily.   31 tablet   11   . Probiotic Product (REZYST IM) CHEW   Oral   Chew 1 tablet by mouth daily.   30 tablet   11   . RESTASIS 0.05 % ophthalmic emulsion   Both Eyes   Place 1 drop into both eyes 2 (two) times daily.         Marland Kitchen topiramate (TOPAMAX) 25 MG capsule   Oral   Take 25 mg by mouth 2 (two) times daily.           . cyclobenzaprine (FLEXERIL) 10 MG tablet   Oral   Take 10 mg by mouth 3 (three) times daily as needed.           Marland Kitchen HYDROcodone-acetaminophen (NORCO/VICODIN) 5-325 MG per tablet   Oral   Take 1 tablet by mouth every 4 (four) hours as needed for pain.   20 tablet   0     BP 145/77   Pulse 77  Temp(Src) 97 F (36.1 C) (Oral)  Resp 20  Ht 5\' 7"  (1.702 m)  Wt 150 lb (68.04 kg)  BMI 23.49 kg/m2  SpO2 100%  Physical Exam  Constitutional: She appears well-developed and well-nourished.  HENT:  Head: Atraumatic.  Neck: Normal range of motion.  Cardiovascular:  Pulses equal bilaterally  Pulmonary/Chest: She exhibits bony tenderness. She exhibits no crepitus, no deformity, no swelling and no retraction.    Musculoskeletal: She exhibits tenderness.       Left ankle: She exhibits decreased range of motion, swelling and ecchymosis. She exhibits no deformity and normal pulse. Tenderness. Lateral malleolus tenderness found. No proximal fibula tenderness found. Achilles tendon normal.  Neurological: She is alert. She has normal strength. She displays normal reflexes. No sensory deficit.  Equal strength  Skin: Skin is warm and dry.  Psychiatric: She has a normal mood and affect.    ED Course  Procedures (including critical care time)  Labs Reviewed - No data to display Dg Ribs Unilateral W/chest Right  10/21/2012   *RADIOLOGY REPORT*  Clinical Data: Ankle and right rib pain  RIGHT RIBS AND CHEST - 3+ VIEW  Comparison: Prior chest x-ray 02/19/2009  Findings: The lungs are clear.  Cardiac and mediastinal contours within normal limits given frontal technique.  No acute fracture identified.  IMPRESSION:  No acute cardiopulmonary process.  No displaced fracture identified.   Original Report Authenticated By: Malachy Moan, M.D.   Dg Ankle Complete Left  10/21/2012   *RADIOLOGY REPORT*  Clinical Data: Rib and ankle pain  LEFT ANKLE COMPLETE - 3+ VIEW  Comparison: Concurrently obtained radiographs of the foot  Findings: No acute fracture, malalignment or ankle joint effusion. No focal soft tissue swelling.  Normal bony mineralization.  No aggressive lytic or blastic osseous lesion.  IMPRESSION: Negative study.   Original Report Authenticated By: Malachy Moan, M.D.   Dg  Foot Complete Left  10/21/2012   *  RADIOLOGY REPORT*  Clinical Data: Rib and ankle pain  LEFT FOOT - COMPLETE 3+ VIEW  Comparison: Concurrently obtained radiographs of the ankle  Findings: Mild hallux valgus angulation of the great toe with soft tissue bunion formation.  No acute fracture or malalignment. Normal bony mineralization.  No lytic or blastic osseous lesion. No significant degenerative changes.  IMPRESSION:  1.  No acute osseous abnormality. 2.  Mild hallux valgus angulation of the great toe with soft tissue bunion formation.   Original Report Authenticated By: Malachy Moan, M.D.     1. Ankle sprain and strain, left, initial encounter   2. Rib contusion, right, initial encounter       MDM  Patients labs and/or radiological studies were viewed and considered during the medical decision making and disposition process.  Pt was fitted with aso for left ankle.  She was unable to tolerate crutches given right rib pain,  She was encouraged to minimize weight bearing,  RICE amap.  hydocodone prescribed.  Recommended f/u by pcp in 1 week.  Also advised recheck immediately for any sob,  Fever,  Worsened cp,  Discussed splinting when needing to cough, sneeze, etc.  Encouraged frequent deep inspiration.     Burgess Amor, PA-C 10/22/12 [phone]

## 2012-10-22 NOTE — ED Provider Notes (Signed)
Medical screening examination/treatment/procedure(s) were performed by non-physician practitioner and as supervising physician I was immediately available for consultation/collaboration.   Shelda Jakes, MD 10/22/12 1550

## 2012-12-01 ENCOUNTER — Ambulatory Visit (HOSPITAL_COMMUNITY)
Admission: RE | Admit: 2012-12-01 | Discharge: 2012-12-01 | Disposition: A | Discharge status: Home / Self Care | Payer: BC Managed Care – PPO | Source: Ambulatory Visit | Attending: Family Medicine | Admitting: Family Medicine

## 2012-12-01 ENCOUNTER — Other Ambulatory Visit (HOSPITAL_COMMUNITY): Payer: Self-pay | Admitting: Family Medicine

## 2012-12-01 DIAGNOSIS — S8990XA Unspecified injury of unspecified lower leg, initial encounter: Secondary | ICD-10-CM

## 2012-12-01 DIAGNOSIS — S298XXA Other specified injuries of thorax, initial encounter: Secondary | ICD-10-CM

## 2012-12-01 DIAGNOSIS — S99929A Unspecified injury of unspecified foot, initial encounter: Secondary | ICD-10-CM

## 2012-12-01 DIAGNOSIS — M25579 Pain in unspecified ankle and joints of unspecified foot: Secondary | ICD-10-CM | POA: Insufficient documentation

## 2012-12-01 DIAGNOSIS — R079 Chest pain, unspecified: Secondary | ICD-10-CM | POA: Insufficient documentation

## 2012-12-01 DIAGNOSIS — S99919A Unspecified injury of unspecified ankle, initial encounter: Secondary | ICD-10-CM

## 2013-01-31 ENCOUNTER — Telehealth: Payer: Self-pay | Admitting: *Deleted

## 2013-01-31 NOTE — Telephone Encounter (Signed)
Pt called stating the RX Resist her pharmacy does not have that anymore, pt would like something her insurance will pay for [employer]. please advise [phone].

## 2013-02-02 MED ORDER — REZYST IM PO CHEW: 1.0000 | CHEWABLE_TABLET | Freq: Every day | ORAL | Status: DC

## 2013-02-02 NOTE — Telephone Encounter (Signed)
Done

## 2013-02-02 NOTE — Addendum Note (Signed)
Addended by: Nira Retort on: 02/02/2013 10:58 AM   Modules accepted: Orders

## 2013-02-02 NOTE — Telephone Encounter (Signed)
Routing to refill box. rx is rezist.

## 2013-02-21 ENCOUNTER — Other Ambulatory Visit (HOSPITAL_COMMUNITY): Payer: Self-pay | Admitting: Family Medicine

## 2013-02-21 DIAGNOSIS — Z0000 Encounter for general adult medical examination without abnormal findings: Secondary | ICD-10-CM

## 2013-02-27 ENCOUNTER — Other Ambulatory Visit (HOSPITAL_COMMUNITY): Payer: BC Managed Care – PPO

## 2013-03-09 ENCOUNTER — Other Ambulatory Visit: Payer: Self-pay

## 2013-03-15 ENCOUNTER — Encounter: Payer: Self-pay | Admitting: Internal Medicine

## 2013-03-20 ENCOUNTER — Other Ambulatory Visit: Payer: Self-pay

## 2013-03-21 MED ORDER — PANTOPRAZOLE SODIUM 40 MG PO TBEC: 40.0000 mg | DELAYED_RELEASE_TABLET | Freq: Every day | ORAL | Status: DC

## 2013-04-12 ENCOUNTER — Other Ambulatory Visit: Payer: Self-pay | Admitting: Physical Medicine and Rehabilitation

## 2013-04-12 DIAGNOSIS — M545 Low back pain, unspecified: Secondary | ICD-10-CM

## 2013-04-20 ENCOUNTER — Other Ambulatory Visit: Payer: Self-pay | Admitting: Obstetrics and Gynecology

## 2013-04-21 ENCOUNTER — Ambulatory Visit
Admission: RE | Admit: 2013-04-21 | Discharge: 2013-04-21 | Disposition: A | Discharge status: Home / Self Care | Payer: BC Managed Care – PPO | Source: Ambulatory Visit | Attending: Physical Medicine and Rehabilitation | Admitting: Physical Medicine and Rehabilitation

## 2013-04-21 DIAGNOSIS — M545 Low back pain, unspecified: Secondary | ICD-10-CM

## 2013-08-22 ENCOUNTER — Ambulatory Visit (HOSPITAL_COMMUNITY)
Admission: RE | Admit: 2013-08-22 | Discharge: 2013-08-22 | Disposition: A | Discharge status: Home / Self Care | Payer: BC Managed Care – PPO | Source: Ambulatory Visit | Attending: Internal Medicine | Admitting: Internal Medicine

## 2013-08-22 ENCOUNTER — Other Ambulatory Visit (HOSPITAL_COMMUNITY): Payer: Self-pay | Admitting: Internal Medicine

## 2013-08-22 DIAGNOSIS — M79672 Pain in left foot: Secondary | ICD-10-CM

## 2013-08-22 DIAGNOSIS — M25572 Pain in left ankle and joints of left foot: Secondary | ICD-10-CM

## 2013-08-22 DIAGNOSIS — M79609 Pain in unspecified limb: Secondary | ICD-10-CM | POA: Insufficient documentation

## 2013-08-22 DIAGNOSIS — M25569 Pain in unspecified knee: Secondary | ICD-10-CM | POA: Insufficient documentation

## 2013-09-04 ENCOUNTER — Other Ambulatory Visit (HOSPITAL_COMMUNITY): Payer: Self-pay | Admitting: Internal Medicine

## 2013-09-04 ENCOUNTER — Ambulatory Visit (HOSPITAL_COMMUNITY)
Admission: RE | Admit: 2013-09-04 | Discharge: 2013-09-04 | Disposition: A | Discharge status: Home / Self Care | Payer: BC Managed Care – PPO | Source: Ambulatory Visit | Attending: Internal Medicine | Admitting: Internal Medicine

## 2013-09-04 DIAGNOSIS — S99919A Unspecified injury of unspecified ankle, initial encounter: Secondary | ICD-10-CM | POA: Insufficient documentation

## 2013-09-04 DIAGNOSIS — M25579 Pain in unspecified ankle and joints of unspecified foot: Secondary | ICD-10-CM | POA: Insufficient documentation

## 2013-09-04 DIAGNOSIS — M79605 Pain in left leg: Secondary | ICD-10-CM

## 2013-09-04 DIAGNOSIS — X500XXA Overexertion from strenuous movement or load, initial encounter: Secondary | ICD-10-CM | POA: Insufficient documentation

## 2013-09-04 DIAGNOSIS — S99929A Unspecified injury of unspecified foot, initial encounter: Principal | ICD-10-CM

## 2013-09-04 DIAGNOSIS — S8990XA Unspecified injury of unspecified lower leg, initial encounter: Secondary | ICD-10-CM | POA: Insufficient documentation

## 2013-10-20 ENCOUNTER — Ambulatory Visit: Payer: BC Managed Care – PPO | Admitting: Urology

## 2013-12-15 ENCOUNTER — Ambulatory Visit (INDEPENDENT_AMBULATORY_CARE_PROVIDER_SITE_OTHER): Payer: BC Managed Care – PPO | Admitting: Urology

## 2013-12-15 DIAGNOSIS — N3000 Acute cystitis without hematuria: Secondary | ICD-10-CM

## 2013-12-15 DIAGNOSIS — R310 Gross hematuria: Secondary | ICD-10-CM

## 2013-12-22 ENCOUNTER — Other Ambulatory Visit: Payer: Self-pay | Admitting: Urology

## 2013-12-22 DIAGNOSIS — N3001 Acute cystitis with hematuria: Secondary | ICD-10-CM

## 2014-01-03 ENCOUNTER — Ambulatory Visit (HOSPITAL_COMMUNITY)
Admission: RE | Admit: 2014-01-03 | Discharge: 2014-01-03 | Disposition: A | Discharge status: Home / Self Care | Payer: BC Managed Care – PPO | Source: Ambulatory Visit | Attending: Urology | Admitting: Urology

## 2014-01-03 DIAGNOSIS — N3001 Acute cystitis with hematuria: Secondary | ICD-10-CM

## 2014-01-03 DIAGNOSIS — N3090 Cystitis, unspecified without hematuria: Secondary | ICD-10-CM | POA: Insufficient documentation

## 2014-01-03 DIAGNOSIS — R319 Hematuria, unspecified: Secondary | ICD-10-CM | POA: Diagnosis not present

## 2014-01-03 LAB — POCT I-STAT CREATININE: Creatinine, Ser: 0.7 mg/dL (ref 0.50–1.10)

## 2014-01-03 MED ORDER — IOHEXOL 300 MG/ML  SOLN
125.0000 mL | Freq: Once | INTRAMUSCULAR | Status: AC | PRN
  Administered 2014-01-03: 125 mL via INTRAVENOUS

## 2014-01-03 MED ORDER — SODIUM CHLORIDE 0.9 % IJ SOLN
INTRAMUSCULAR | Status: AC
  Filled 2014-01-03: qty 500

## 2014-01-03 MED ORDER — SODIUM CHLORIDE 0.9 % IV SOLN
INTRAVENOUS | Status: AC
  Filled 2014-01-03: qty 250

## 2014-01-12 ENCOUNTER — Ambulatory Visit (INDEPENDENT_AMBULATORY_CARE_PROVIDER_SITE_OTHER): Payer: BC Managed Care – PPO | Admitting: Urology

## 2014-01-12 DIAGNOSIS — R310 Gross hematuria: Secondary | ICD-10-CM

## 2014-05-08 ENCOUNTER — Other Ambulatory Visit: Payer: Self-pay | Admitting: Obstetrics and Gynecology

## 2014-05-09 LAB — CYTOLOGY - PAP

## 2014-12-20 ENCOUNTER — Encounter: Payer: Self-pay | Admitting: Internal Medicine

## 2015-01-09 ENCOUNTER — Other Ambulatory Visit: Payer: Self-pay | Admitting: Gastroenterology

## 2015-01-09 ENCOUNTER — Encounter: Payer: Self-pay | Admitting: Gastroenterology

## 2015-01-09 ENCOUNTER — Ambulatory Visit (INDEPENDENT_AMBULATORY_CARE_PROVIDER_SITE_OTHER): Payer: 59 | Admitting: Gastroenterology

## 2015-01-09 VITALS — BP 143/94 | HR 97 | Temp 98.3°F | Ht 67.0 in | Wt 171.0 lb

## 2015-01-09 DIAGNOSIS — K529 Noninfective gastroenteritis and colitis, unspecified: Secondary | ICD-10-CM

## 2015-01-09 DIAGNOSIS — K219 Gastro-esophageal reflux disease without esophagitis: Secondary | ICD-10-CM | POA: Diagnosis not present

## 2015-01-09 NOTE — Assessment & Plan Note (Signed)
54 year old female with chronic diarrhea, usually in the morning, with remote history of Cdiff (2012). No alarm features such as weight loss, abdominal pain, lack of appetite, rectal bleeding. Last colonoscopy 2009. Due to history of Cdiff and last stool studies almost a year ago, will recheck Cdiff PCR, stool culture, and Giardia, although I doubt these will be positive. Doubt viberzi would be a good agent to help, as she does not have typical IBS symptoms of abdominal pain. However, I do think she needs to decrease alcohol intake each evening, which could be contributing to diarrhea. Avoid dairy products, add supplemental fiber. Will check celiac labs for completeness' sake, although this will likely be negative as well. If persistent symptoms despite supportive measures, would consider colonoscopy as last was in 2009. Return in 3 months or sooner if needed. Once stool studies completed, may consider Bentyl or low dose Imodium prn.

## 2015-01-09 NOTE — Progress Notes (Signed)
cc'ed to pcp °

## 2015-01-09 NOTE — Patient Instructions (Addendum)
Please have blood work and stool studies done. Once I see they are negative, we can start supportive measures. I don't think we need to do a colonoscopy yet, but you may need in the future.   Start taking supplemental fiber daily such as Metamucil or Benefiber. Cut down on the red wine each night, as alcohol can contribute to loose stools. Please review the lactose free diet.   Stop ranitidine. Start taking Pepcid twice a day. This is over the counter.    I would like to see you back in 3 months. Please call if things worsen in the interim.   Lactose-Free Diet Lactose is a carbohydrate that is found mainly in milk and milk products, as well as in foods with added milk or whey. Lactose must be digested by the enzyme lactase in order to be used by the body. Lactose intolerance occurs when there is a shortage of lactase. When your body is not able to digest lactose, you may feel sick to your stomach (nausea), bloated, and have cramps, gas, and diarrhea. TYPES OF LACTASE DEFICIENCY  Primary lactase deficiency. This is the most common type. It is characterized by a slow decrease in lactase activity.  Secondary lactase deficiency. This occurs following injury to the small intestinal mucosa as a result of a disease or condition. It can also occur as a result of surgery or after treatment with antibiotic medicines or cancer drugs. Tolerance to lactose varies widely. Each person must determine how much milk can be consumed without developing symptoms. Drinking smaller portions of milk throughout the day may be helpful. Some studies suggest that slowing gastric emptying may help increase tolerance of milk products. This may be done by:  Consuming milk or milk products with a meal rather than alone.  Consuming milk with a higher fat content. There are many dairy products that may be tolerated better than milk by some people, including:  Cheese (especially aged cheese). The lactose content is much lower  than in milk.  Cultured dairy products, such as yogurt, buttermilk, cottage cheese, and sweet acidophilus milk (kefir). These products are usually well tolerated by lactase-deficient people. This is because the healthy bacteria help digest lactose.  Lactose-hydrolyzed milk. This product contains 40% to 90% less lactose than milk and may also be well tolerated. ADEQUACY These diets may be deficient in calcium, riboflavin, and vitamin D, according to the Recommended Dietary Allowances of the [employer]. Depending on individual tolerances and the use of milk substitutes, milk, or other dairy products, you may be able to meet these recommendations. SPECIAL NOTES  Lactose is a carbohydrate. The main food source for lactose is dairy products. Reading food labels is important. Many products contain lactose even when they are not made from milk. Look for the following words: whey, milk solids, dry milk solids, nonfat dry milk powder. Typical sources of lactose other than dairy products include breads, candies, cold cuts, prepared and processed foods, and commercial sauces and gravies.  All foods must be prepared without milk, cream, or other dairy foods.  A vitamin or mineral supplement may be necessary. Consult your caregiver or Registered Dietitian.  Lactose is also found in many prescription and over-the-counter medicines.  Soy milk and lactose-free supplements may be used as an alternative to milk. CHOOSING FOODS Breads and Starches  Allowed: Breads and rolls made without milk. [place], [place], or [place] bread. Soda crackers, graham crackers. Any crackers prepared without lactose. Cooked or dry cereals prepared without lactose (read  labels). Any potatoes, pasta, or rice prepared without milk or lactose. Popcorn.  Avoid: Breads and rolls that contain milk. Prepared mixes such as muffins, biscuits, waffles, pancakes. Sweet rolls, donuts, [place] toast (if made with milk or lactose).  Zwieback crackers, corn curls, or any crackers that contain lactose. Cooked or dry cereals prepared with lactose (read labels). Instant potatoes, frozen [place] fries, scalloped or au gratin potatoes. Vegetables  Allowed: Fresh, frozen, and canned vegetables.  Avoid: Creamed or breaded vegetables. Vegetables in a cheese sauce or with lactose-containing margarines. Fruit  Allowed: All fresh, canned, or frozen fruits that are not processed with lactose.  Avoid: Any canned or frozen fruits processed with lactose. Meat and Meat Substitutes  Allowed: Plain beef, chicken, fish, [place], lamb, veal, pork, or ham. Kosher prepared meat products. Strained or junior meats that do not contain milk. Eggs, soy meat substitutes, nuts.  Avoid: Scrambled eggs, omelets, and souffles that contain milk. Creamed or breaded meat, fish, or fowl. Sausage products such as wieners, liver sausage, or cold cuts that contain milk solids. Cheese, cottage cheese, or cheese spreads. Milk  Allowed: None.  Avoid: Milk (whole, 2%, skim, or chocolate). Evaporated, powdered, or condensed milk. Malted milk. Soups and Combination Foods  Allowed: Bouillon, broth, vegetable soups, clear soups, consomms. Homemade soups made with allowed ingredients. Combination or prepared foods that do not contain milk or milk products (read labels).  Avoid: Cream soups, chowders, commercially prepared soups containing lactose. Macaroni and cheese, pizza. Combination or prepared foods that contain milk or milk products. Desserts and Sweets  Allowed: Water and fruit ices, gelatin, angel food cake. Homemade cookies, pies, or cakes made from allowed ingredients. Pudding (if made with water or a milk substitute). Lactose-free tofu desserts. Sugar, honey, corn syrup, jam, jelly, marmalade, molasses (beet sugar). Pure sugar candy, marshmallows.  Avoid: Ice cream, ice milk, sherbet, custard, pudding, frozen yogurt. Commercial cake and cookie mixes.  Desserts that contain chocolate. Pie crust made with milk-containing margarine. Reduced calorie desserts made with a sugar substitute that contains lactose. Toffee, peppermint, butterscotch, chocolate, caramels. Fats and Oils  Allowed: Butter (as tolerated, contains very small amounts of lactose). Margarines and dressings that do not contain milk. Vegetable oils, shortening, mayonnaise, nondairy cream and whipped toppings without lactose or milk solids added. Tomasa Blase.  Avoid: Margarines and salad dressings containing milk. Cream, cream cheese, peanut butter with added milk solids, sour cream, chip dips made with sour cream. Beverages  Allowed: Carbonated drinks, tea, coffee and freeze-dried coffee, some instant coffees (check labels). Fruit drinks, fruit and vegetable juice, rice or soy milk.  Avoid: Hot chocolate. Some cocoas, some instant coffees, instant iced teas, powdered fruit drinks (read labels). Condiments  Allowed: Soy sauce, carob powder, olives, gravy made with water, baker's cocoa, pickles, pure seasonings and spices, wine, pure monosodium glutamate, catsup, mustard.  Avoid: Some chewing gums, chocolate, some cocoas. Certain antibiotics and vitamin or mineral preparations. Spice blends if they contain milk products. MSG extender. Artificial sweeteners that contain lactose. Some nondairy creamers (read labels). SAMPLE MENU Breakfast  Orange juice.  Banana.  Bran cereal.  Nondairy creamer.  Vienna bread, toasted.  Butter or milk-free margarine.  Coffee or tea. Lunch  Chicken breast.  Rice.  Green beans.  Butter or milk-free margarine.  Fresh melon.  Coffee or tea. Dinner  [place].  Baked potato.  Butter or milk-free margarine.  Broccoli.  Lettuce salad with vinegar and oil dressing.  [employer].  Coffee or tea. Document Released: 10/10/2001  Document Revised: 07/13/2011 Document Reviewed: 07/21/2013 [facility] Patient Information 2015  [place], [place]. This information is not intended to replace advice given to you by your health care provider. Make sure you discuss any questions you have with your health care provider.

## 2015-01-09 NOTE — Progress Notes (Signed)
Primary Care Physician:  Carylon Perches, MD Primary Gastroenterologist:  Dr. Jena Gauss   Chief Complaint  Patient presents with  . Diarrhea    HPI:   Heather Davidson is a 54 y.o. female presenting today at the request of Dr. Ouida Sills secondary to diarrhea. She was last seen in Nov 2013 in our office. History of GERD, cdiff in 2012, last colonoscopy in 2009 with normal colon and TI. Appears stool studies done through Dr. Alonza Smoker office late 2015 with negative Cdiff PCR, negative Giardia, and negative stool culture.   States every day in the morning will have about 3 bouts of diarrhea then fine for the rest of the day. No real abdominal pain or cramping. No rectal bleeding. Chronically "black". Doesn't have a foul odor. Very rarely will have a normal bowel movement. No unexplained weight loss. Appetite is good. Chronic belching/gas. Doesn't feel like heartburn. No N/V. No dysphagia. Takes ranitidine, which doesn't seem to help. Was on omeprazole but got off due to history of cdiff. Ranitidine once per day. Probiotic daily (usually Align). Drinks about 3 glasses of wine each evening.     Past Medical History  Diagnosis Date  . Anxiety   . Depression   . Seasonal allergies   . HTN (hypertension)   . Clostridium difficile colitis 12/2010  . GERD (gastroesophageal reflux disease)     Past Surgical History  Procedure Laterality Date  . Cesarean section    . Tubal ligation    . Complete hysterectomy    . Cervical conization w/bx      for precervical cancer in remote past  . Esophagogastroduodenoscopy  09/2007    solitary inlet patch (bx neg), antral erosions  . Colonoscopy  09/2007    anal papilla, normal colon and terminal ileum    Current Outpatient Prescriptions  Medication Sig Dispense Refill  . ALPRAZolam (XANAX) 0.5 MG tablet Take 0.5 mg by mouth at bedtime as needed for anxiety.    . Biotin (BIOTIN MAXIMUM STRENGTH) 10 MG TABS Take 10,000 mg by mouth daily.    . citalopram (CELEXA)  20 MG tablet Take 20 mg by mouth daily.      . fluticasone (VERAMYST) 27.5 MCG/SPRAY nasal spray Place 2 sprays into the nose daily.      Marland Kitchen losartan (COZAAR) 100 MG tablet Take 100 mg by mouth daily.      . montelukast (SINGULAIR) 10 MG tablet Take 10 mg by mouth at bedtime.     . Multiple Vitamin (MULTIVITAMIN) capsule Take 1 capsule by mouth daily.    . Probiotic Product (REZYST IM) CHEW Chew 1 tablet by mouth daily. 30 tablet 11  . ranitidine (ZANTAC) 150 MG capsule Take 150 mg by mouth 2 (two) times daily.    . RESTASIS 0.05 % ophthalmic emulsion Place 1 drop into both eyes 2 (two) times daily.    Marland Kitchen topiramate (TOPAMAX) 25 MG capsule Take 25 mg by mouth 2 (two) times daily.      . traZODone (DESYREL) 50 MG tablet Take 50 mg by mouth at bedtime.     No current facility-administered medications for this visit.    Allergies as of 01/09/2015 - Review Complete 01/09/2015  Allergen Reaction Noted  . Ciprofloxacin Nausea And Vomiting 09/30/2010    Family History  Problem Relation Age of Onset  . Lung cancer Father     asbestosis  . COPD Mother   . Colon cancer Neg Hx     Social History  Social History  . Marital Status: Married    Spouse Name: N/A  . Number of Children: 1  . Years of Education: N/A   Occupational History  .    [employer]     Social History Main Topics  . Smoking status: Current Every Day Smoker -- 0.50 packs/day for 25 years    Types: Cigarettes  . Smokeless tobacco: Not on file  . Alcohol Use: 0.0 oz/week    0 Standard drinks or equivalent per week     Comment: 3 glass of red wine a night   . Drug Use: No  . Sexual Activity: Not on file   Other Topics Concern  . Not on file   Social History Narrative   Lives w/ husband   1 grown healthy son    Review of Systems: Gen: see HPI  CV: Denies chest pain, heart palpitations, peripheral edema, syncope.  Resp: Denies shortness of breath at rest or with exertion. Denies wheezing or cough.  GI: see HPI GU :  Denies urinary burning, urinary frequency, urinary hesitancy MS: bulging discs in spine  Derm: Denies rash, itching, dry skin Psych: some situational depression, mother passed away 2014/10/07.  Heme: Denies bruising, bleeding, and enlarged lymph nodes.  Physical Exam: BP 143/94 mmHg  Pulse 97  Temp(Src) 98.3 F (36.8 C) (Oral)  Ht 5\' 7"  (1.702 m)  Wt 171 lb (77.565 kg)  BMI 26.78 kg/m2 General:   Alert and oriented. Pleasant and cooperative. Well-nourished and well-developed.  Head:  Normocephalic and atraumatic. Eyes:  Without icterus, sclera clear and conjunctiva pink.  Ears:  Normal auditory acuity. Nose:  No deformity, discharge,  or lesions. Mouth:  No deformity or lesions, oral mucosa pink.  Lungs:  Clear to auscultation bilaterally. No wheezes, rales, or rhonchi. No distress.  Heart:  S1, S2 present without murmurs appreciated.  Abdomen:  +BS, soft, non-tender and non-distended. No HSM noted. No guarding or rebound. No masses appreciated.  Rectal:  Deferred  Msk:  Symmetrical without gross deformities. Normal posture. Extremities:  Without clubbing or edema. Neurologic:  Alert and  oriented x4;  grossly normal neurologically. Skin:  Intact without significant lesions or rashes. Psych:  Alert and cooperative. Normal mood and affect.  Labs from April 2016:  Hgb 13.3, MCV elevated at 103.   Stool studies Oct 2015: negative Cdiff PCR, negative stool culture, negative Giardia

## 2015-01-09 NOTE — Assessment & Plan Note (Signed)
Stop ranitidine. Start Pepcid. Agree with avoidance of PPIs due to history of Cdiff; however, H2 blockers have also been shown to be a risk factor. However, due to symptomatic GERD, patient needs to have some type of therapy, and an H2 blocker instead of a PPI would be more appropriate.

## 2015-01-10 LAB — CLOSTRIDIUM DIFFICILE BY PCR

## 2015-01-10 LAB — GIARDIA ANTIGEN: Giardia Screen (EIA): NEGATIVE

## 2015-01-10 LAB — IGA: IgA: 220 mg/dL (ref 69–380)

## 2015-01-11 LAB — TISSUE TRANSGLUTAMINASE, IGA: Tissue Transglutaminase Ab, IgA: 1 U/mL (ref <4)

## 2015-01-13 LAB — STOOL CULTURE

## 2015-01-20 NOTE — Progress Notes (Signed)
Quick Note:  Negative stool studies. Celiac serologies normal. How is patient since decreasing alcohol? ______

## 2015-01-31 NOTE — Progress Notes (Signed)
Quick Note:  I would recommend continuing decrease of alcohol. See if that helps. Supplemental fiber daily. Let's check an ifobt. If positive, recommend colonoscopy. ______

## 2015-04-10 ENCOUNTER — Ambulatory Visit: Payer: 59 | Admitting: Gastroenterology

## 2015-09-02 DIAGNOSIS — R42 Dizziness and giddiness: Secondary | ICD-10-CM | POA: Diagnosis not present

## 2015-09-02 DIAGNOSIS — H903 Sensorineural hearing loss, bilateral: Secondary | ICD-10-CM | POA: Diagnosis not present

## 2015-09-02 DIAGNOSIS — H8111 Benign paroxysmal vertigo, right ear: Secondary | ICD-10-CM | POA: Diagnosis not present

## 2015-10-01 DIAGNOSIS — R42 Dizziness and giddiness: Secondary | ICD-10-CM | POA: Diagnosis not present

## 2015-10-01 DIAGNOSIS — H903 Sensorineural hearing loss, bilateral: Secondary | ICD-10-CM | POA: Diagnosis not present

## 2015-10-01 DIAGNOSIS — H8111 Benign paroxysmal vertigo, right ear: Secondary | ICD-10-CM | POA: Diagnosis not present

## 2015-11-04 DIAGNOSIS — G4700 Insomnia, unspecified: Secondary | ICD-10-CM | POA: Diagnosis not present

## 2015-11-04 DIAGNOSIS — E785 Hyperlipidemia, unspecified: Secondary | ICD-10-CM | POA: Diagnosis not present

## 2015-11-04 DIAGNOSIS — I10 Essential (primary) hypertension: Secondary | ICD-10-CM | POA: Diagnosis not present

## 2015-11-04 DIAGNOSIS — Z79899 Other long term (current) drug therapy: Secondary | ICD-10-CM | POA: Diagnosis not present

## 2015-11-11 DIAGNOSIS — Z6827 Body mass index (BMI) 27.0-27.9, adult: Secondary | ICD-10-CM | POA: Diagnosis not present

## 2015-11-11 DIAGNOSIS — E785 Hyperlipidemia, unspecified: Secondary | ICD-10-CM | POA: Diagnosis not present

## 2015-11-11 DIAGNOSIS — I10 Essential (primary) hypertension: Secondary | ICD-10-CM | POA: Diagnosis not present

## 2016-01-20 ENCOUNTER — Ambulatory Visit (INDEPENDENT_AMBULATORY_CARE_PROVIDER_SITE_OTHER): Payer: Self-pay | Admitting: Otolaryngology

## 2016-01-21 DIAGNOSIS — R42 Dizziness and giddiness: Secondary | ICD-10-CM | POA: Diagnosis not present

## 2016-01-21 DIAGNOSIS — J310 Chronic rhinitis: Secondary | ICD-10-CM | POA: Diagnosis not present

## 2016-01-21 DIAGNOSIS — J0101 Acute recurrent maxillary sinusitis: Secondary | ICD-10-CM | POA: Diagnosis not present

## 2016-02-11 DIAGNOSIS — H8111 Benign paroxysmal vertigo, right ear: Secondary | ICD-10-CM | POA: Diagnosis not present

## 2016-02-11 DIAGNOSIS — J0101 Acute recurrent maxillary sinusitis: Secondary | ICD-10-CM | POA: Diagnosis not present

## 2016-06-08 DIAGNOSIS — Z1231 Encounter for screening mammogram for malignant neoplasm of breast: Secondary | ICD-10-CM | POA: Diagnosis not present

## 2016-06-08 DIAGNOSIS — Z6827 Body mass index (BMI) 27.0-27.9, adult: Secondary | ICD-10-CM | POA: Diagnosis not present

## 2016-06-08 DIAGNOSIS — Z01419 Encounter for gynecological examination (general) (routine) without abnormal findings: Secondary | ICD-10-CM | POA: Diagnosis not present

## 2016-06-08 DIAGNOSIS — Z13820 Encounter for screening for osteoporosis: Secondary | ICD-10-CM | POA: Diagnosis not present

## 2016-10-06 DIAGNOSIS — Z79899 Other long term (current) drug therapy: Secondary | ICD-10-CM | POA: Diagnosis not present

## 2016-10-06 DIAGNOSIS — I10 Essential (primary) hypertension: Secondary | ICD-10-CM | POA: Diagnosis not present

## 2016-10-06 DIAGNOSIS — E785 Hyperlipidemia, unspecified: Secondary | ICD-10-CM | POA: Diagnosis not present

## 2016-10-13 DIAGNOSIS — Z6827 Body mass index (BMI) 27.0-27.9, adult: Secondary | ICD-10-CM | POA: Diagnosis not present

## 2016-10-13 DIAGNOSIS — E785 Hyperlipidemia, unspecified: Secondary | ICD-10-CM | POA: Diagnosis not present

## 2016-10-13 DIAGNOSIS — Z0001 Encounter for general adult medical examination with abnormal findings: Secondary | ICD-10-CM | POA: Diagnosis not present

## 2016-10-13 DIAGNOSIS — I10 Essential (primary) hypertension: Secondary | ICD-10-CM | POA: Diagnosis not present

## 2016-10-21 ENCOUNTER — Other Ambulatory Visit: Payer: Self-pay | Admitting: Internal Medicine

## 2016-10-21 DIAGNOSIS — F17200 Nicotine dependence, unspecified, uncomplicated: Secondary | ICD-10-CM

## 2016-10-21 DIAGNOSIS — M5033 Other cervical disc degeneration, cervicothoracic region: Secondary | ICD-10-CM | POA: Diagnosis not present

## 2016-10-22 DIAGNOSIS — M5033 Other cervical disc degeneration, cervicothoracic region: Secondary | ICD-10-CM | POA: Diagnosis not present

## 2016-10-26 DIAGNOSIS — M5033 Other cervical disc degeneration, cervicothoracic region: Secondary | ICD-10-CM | POA: Diagnosis not present

## 2016-10-27 ENCOUNTER — Ambulatory Visit: Payer: Self-pay

## 2016-10-28 DIAGNOSIS — M5033 Other cervical disc degeneration, cervicothoracic region: Secondary | ICD-10-CM | POA: Diagnosis not present

## 2016-10-29 DIAGNOSIS — M5033 Other cervical disc degeneration, cervicothoracic region: Secondary | ICD-10-CM | POA: Diagnosis not present

## 2016-11-09 DIAGNOSIS — M5033 Other cervical disc degeneration, cervicothoracic region: Secondary | ICD-10-CM | POA: Diagnosis not present

## 2016-11-11 DIAGNOSIS — M5033 Other cervical disc degeneration, cervicothoracic region: Secondary | ICD-10-CM | POA: Diagnosis not present

## 2016-11-12 DIAGNOSIS — M5033 Other cervical disc degeneration, cervicothoracic region: Secondary | ICD-10-CM | POA: Diagnosis not present

## 2016-11-13 ENCOUNTER — Ambulatory Visit
Admission: RE | Admit: 2016-11-13 | Discharge: 2016-11-13 | Disposition: A | Discharge status: Home / Self Care | Payer: BLUE CROSS/BLUE SHIELD | Source: Ambulatory Visit | Attending: Internal Medicine | Admitting: Internal Medicine

## 2016-11-13 DIAGNOSIS — F17200 Nicotine dependence, unspecified, uncomplicated: Secondary | ICD-10-CM

## 2016-11-13 DIAGNOSIS — Z87891 Personal history of nicotine dependence: Secondary | ICD-10-CM | POA: Diagnosis not present

## 2016-11-16 DIAGNOSIS — M5033 Other cervical disc degeneration, cervicothoracic region: Secondary | ICD-10-CM | POA: Diagnosis not present

## 2016-11-17 DIAGNOSIS — M5033 Other cervical disc degeneration, cervicothoracic region: Secondary | ICD-10-CM | POA: Diagnosis not present

## 2016-11-19 DIAGNOSIS — M5033 Other cervical disc degeneration, cervicothoracic region: Secondary | ICD-10-CM | POA: Diagnosis not present

## 2016-11-23 DIAGNOSIS — M5033 Other cervical disc degeneration, cervicothoracic region: Secondary | ICD-10-CM | POA: Diagnosis not present

## 2016-11-24 DIAGNOSIS — M5033 Other cervical disc degeneration, cervicothoracic region: Secondary | ICD-10-CM | POA: Diagnosis not present

## 2016-11-26 DIAGNOSIS — M5033 Other cervical disc degeneration, cervicothoracic region: Secondary | ICD-10-CM | POA: Diagnosis not present

## 2016-11-30 DIAGNOSIS — M5033 Other cervical disc degeneration, cervicothoracic region: Secondary | ICD-10-CM | POA: Diagnosis not present

## 2016-12-02 DIAGNOSIS — M5033 Other cervical disc degeneration, cervicothoracic region: Secondary | ICD-10-CM | POA: Diagnosis not present

## 2016-12-03 DIAGNOSIS — M5033 Other cervical disc degeneration, cervicothoracic region: Secondary | ICD-10-CM | POA: Diagnosis not present

## 2016-12-07 DIAGNOSIS — M5033 Other cervical disc degeneration, cervicothoracic region: Secondary | ICD-10-CM | POA: Diagnosis not present

## 2016-12-09 DIAGNOSIS — M5033 Other cervical disc degeneration, cervicothoracic region: Secondary | ICD-10-CM | POA: Diagnosis not present

## 2016-12-10 DIAGNOSIS — M5033 Other cervical disc degeneration, cervicothoracic region: Secondary | ICD-10-CM | POA: Diagnosis not present

## 2016-12-14 DIAGNOSIS — M5033 Other cervical disc degeneration, cervicothoracic region: Secondary | ICD-10-CM | POA: Diagnosis not present

## 2016-12-16 DIAGNOSIS — M5033 Other cervical disc degeneration, cervicothoracic region: Secondary | ICD-10-CM | POA: Diagnosis not present

## 2016-12-17 DIAGNOSIS — M5033 Other cervical disc degeneration, cervicothoracic region: Secondary | ICD-10-CM | POA: Diagnosis not present

## 2016-12-21 DIAGNOSIS — M5033 Other cervical disc degeneration, cervicothoracic region: Secondary | ICD-10-CM | POA: Diagnosis not present

## 2016-12-23 DIAGNOSIS — M5033 Other cervical disc degeneration, cervicothoracic region: Secondary | ICD-10-CM | POA: Diagnosis not present

## 2016-12-24 DIAGNOSIS — M5033 Other cervical disc degeneration, cervicothoracic region: Secondary | ICD-10-CM | POA: Diagnosis not present

## 2016-12-28 DIAGNOSIS — M5033 Other cervical disc degeneration, cervicothoracic region: Secondary | ICD-10-CM | POA: Diagnosis not present

## 2016-12-30 DIAGNOSIS — M5033 Other cervical disc degeneration, cervicothoracic region: Secondary | ICD-10-CM | POA: Diagnosis not present

## 2016-12-31 DIAGNOSIS — M5033 Other cervical disc degeneration, cervicothoracic region: Secondary | ICD-10-CM | POA: Diagnosis not present

## 2017-01-06 DIAGNOSIS — M5033 Other cervical disc degeneration, cervicothoracic region: Secondary | ICD-10-CM | POA: Diagnosis not present

## 2017-01-07 DIAGNOSIS — M5033 Other cervical disc degeneration, cervicothoracic region: Secondary | ICD-10-CM | POA: Diagnosis not present

## 2017-01-11 DIAGNOSIS — M5033 Other cervical disc degeneration, cervicothoracic region: Secondary | ICD-10-CM | POA: Diagnosis not present

## 2017-01-13 DIAGNOSIS — M5033 Other cervical disc degeneration, cervicothoracic region: Secondary | ICD-10-CM | POA: Diagnosis not present

## 2017-01-14 DIAGNOSIS — M5033 Other cervical disc degeneration, cervicothoracic region: Secondary | ICD-10-CM | POA: Diagnosis not present

## 2017-01-18 DIAGNOSIS — M5033 Other cervical disc degeneration, cervicothoracic region: Secondary | ICD-10-CM | POA: Diagnosis not present

## 2017-01-20 DIAGNOSIS — M5033 Other cervical disc degeneration, cervicothoracic region: Secondary | ICD-10-CM | POA: Diagnosis not present

## 2017-01-21 DIAGNOSIS — M5033 Other cervical disc degeneration, cervicothoracic region: Secondary | ICD-10-CM | POA: Diagnosis not present

## 2017-01-25 DIAGNOSIS — M5033 Other cervical disc degeneration, cervicothoracic region: Secondary | ICD-10-CM | POA: Diagnosis not present

## 2017-01-27 DIAGNOSIS — M5033 Other cervical disc degeneration, cervicothoracic region: Secondary | ICD-10-CM | POA: Diagnosis not present

## 2017-01-28 DIAGNOSIS — M5033 Other cervical disc degeneration, cervicothoracic region: Secondary | ICD-10-CM | POA: Diagnosis not present

## 2017-02-01 DIAGNOSIS — M5033 Other cervical disc degeneration, cervicothoracic region: Secondary | ICD-10-CM | POA: Diagnosis not present

## 2017-02-04 DIAGNOSIS — M5033 Other cervical disc degeneration, cervicothoracic region: Secondary | ICD-10-CM | POA: Diagnosis not present

## 2017-02-09 DIAGNOSIS — M5033 Other cervical disc degeneration, cervicothoracic region: Secondary | ICD-10-CM | POA: Diagnosis not present

## 2017-02-15 DIAGNOSIS — M5033 Other cervical disc degeneration, cervicothoracic region: Secondary | ICD-10-CM | POA: Diagnosis not present

## 2017-02-18 DIAGNOSIS — M5033 Other cervical disc degeneration, cervicothoracic region: Secondary | ICD-10-CM | POA: Diagnosis not present

## 2017-02-22 DIAGNOSIS — M5033 Other cervical disc degeneration, cervicothoracic region: Secondary | ICD-10-CM | POA: Diagnosis not present

## 2017-02-25 DIAGNOSIS — M5033 Other cervical disc degeneration, cervicothoracic region: Secondary | ICD-10-CM | POA: Diagnosis not present

## 2017-03-01 DIAGNOSIS — Z6827 Body mass index (BMI) 27.0-27.9, adult: Secondary | ICD-10-CM | POA: Diagnosis not present

## 2017-03-01 DIAGNOSIS — E6609 Other obesity due to excess calories: Secondary | ICD-10-CM | POA: Diagnosis not present

## 2017-03-01 DIAGNOSIS — R635 Abnormal weight gain: Secondary | ICD-10-CM | POA: Diagnosis not present

## 2017-03-01 DIAGNOSIS — N951 Menopausal and female climacteric states: Secondary | ICD-10-CM | POA: Diagnosis not present

## 2017-03-01 DIAGNOSIS — M5033 Other cervical disc degeneration, cervicothoracic region: Secondary | ICD-10-CM | POA: Diagnosis not present

## 2017-03-04 DIAGNOSIS — M5033 Other cervical disc degeneration, cervicothoracic region: Secondary | ICD-10-CM | POA: Diagnosis not present

## 2017-03-08 DIAGNOSIS — M5033 Other cervical disc degeneration, cervicothoracic region: Secondary | ICD-10-CM | POA: Diagnosis not present

## 2017-03-11 DIAGNOSIS — M5033 Other cervical disc degeneration, cervicothoracic region: Secondary | ICD-10-CM | POA: Diagnosis not present

## 2017-03-15 DIAGNOSIS — M5033 Other cervical disc degeneration, cervicothoracic region: Secondary | ICD-10-CM | POA: Diagnosis not present

## 2017-03-22 DIAGNOSIS — M5033 Other cervical disc degeneration, cervicothoracic region: Secondary | ICD-10-CM | POA: Diagnosis not present

## 2017-03-23 DIAGNOSIS — M5033 Other cervical disc degeneration, cervicothoracic region: Secondary | ICD-10-CM | POA: Diagnosis not present

## 2017-03-29 DIAGNOSIS — M5033 Other cervical disc degeneration, cervicothoracic region: Secondary | ICD-10-CM | POA: Diagnosis not present

## 2017-03-31 DIAGNOSIS — N951 Menopausal and female climacteric states: Secondary | ICD-10-CM | POA: Diagnosis not present

## 2017-04-01 DIAGNOSIS — M5033 Other cervical disc degeneration, cervicothoracic region: Secondary | ICD-10-CM | POA: Diagnosis not present

## 2017-04-05 DIAGNOSIS — M5033 Other cervical disc degeneration, cervicothoracic region: Secondary | ICD-10-CM | POA: Diagnosis not present

## 2017-04-07 DIAGNOSIS — M5033 Other cervical disc degeneration, cervicothoracic region: Secondary | ICD-10-CM | POA: Diagnosis not present

## 2017-04-14 DIAGNOSIS — M5033 Other cervical disc degeneration, cervicothoracic region: Secondary | ICD-10-CM | POA: Diagnosis not present

## 2017-04-19 DIAGNOSIS — M5033 Other cervical disc degeneration, cervicothoracic region: Secondary | ICD-10-CM | POA: Diagnosis not present

## 2017-04-20 DIAGNOSIS — I10 Essential (primary) hypertension: Secondary | ICD-10-CM | POA: Diagnosis not present

## 2017-04-20 DIAGNOSIS — M720 Palmar fascial fibromatosis [Dupuytren]: Secondary | ICD-10-CM | POA: Diagnosis not present

## 2017-04-22 DIAGNOSIS — M5033 Other cervical disc degeneration, cervicothoracic region: Secondary | ICD-10-CM | POA: Diagnosis not present

## 2017-04-29 DIAGNOSIS — M5033 Other cervical disc degeneration, cervicothoracic region: Secondary | ICD-10-CM | POA: Diagnosis not present

## 2017-05-03 DIAGNOSIS — M5033 Other cervical disc degeneration, cervicothoracic region: Secondary | ICD-10-CM | POA: Diagnosis not present

## 2017-05-06 DIAGNOSIS — L920 Granuloma annulare: Secondary | ICD-10-CM | POA: Diagnosis not present

## 2017-05-06 DIAGNOSIS — M5033 Other cervical disc degeneration, cervicothoracic region: Secondary | ICD-10-CM | POA: Diagnosis not present

## 2017-05-06 DIAGNOSIS — B0089 Other herpesviral infection: Secondary | ICD-10-CM | POA: Diagnosis not present

## 2017-05-07 DIAGNOSIS — M720 Palmar fascial fibromatosis [Dupuytren]: Secondary | ICD-10-CM | POA: Diagnosis not present

## 2017-05-07 DIAGNOSIS — M79642 Pain in left hand: Secondary | ICD-10-CM | POA: Diagnosis not present

## 2017-05-07 DIAGNOSIS — M19042 Primary osteoarthritis, left hand: Secondary | ICD-10-CM | POA: Diagnosis not present

## 2017-05-07 DIAGNOSIS — M542 Cervicalgia: Secondary | ICD-10-CM | POA: Diagnosis not present

## 2017-05-10 DIAGNOSIS — M5033 Other cervical disc degeneration, cervicothoracic region: Secondary | ICD-10-CM | POA: Diagnosis not present

## 2017-05-12 DIAGNOSIS — Z6832 Body mass index (BMI) 32.0-32.9, adult: Secondary | ICD-10-CM | POA: Diagnosis not present

## 2017-05-12 DIAGNOSIS — Z113 Encounter for screening for infections with a predominantly sexual mode of transmission: Secondary | ICD-10-CM | POA: Diagnosis not present

## 2017-05-12 DIAGNOSIS — Z01419 Encounter for gynecological examination (general) (routine) without abnormal findings: Secondary | ICD-10-CM | POA: Diagnosis not present

## 2017-05-12 DIAGNOSIS — Z3009 Encounter for other general counseling and advice on contraception: Secondary | ICD-10-CM | POA: Diagnosis not present

## 2017-05-13 DIAGNOSIS — M5033 Other cervical disc degeneration, cervicothoracic region: Secondary | ICD-10-CM | POA: Diagnosis not present

## 2017-05-17 DIAGNOSIS — M5033 Other cervical disc degeneration, cervicothoracic region: Secondary | ICD-10-CM | POA: Diagnosis not present

## 2017-05-18 DIAGNOSIS — M79642 Pain in left hand: Secondary | ICD-10-CM | POA: Diagnosis not present

## 2017-05-18 DIAGNOSIS — G5601 Carpal tunnel syndrome, right upper limb: Secondary | ICD-10-CM | POA: Diagnosis not present

## 2017-05-19 DIAGNOSIS — M542 Cervicalgia: Secondary | ICD-10-CM | POA: Diagnosis not present

## 2017-05-19 DIAGNOSIS — G5603 Carpal tunnel syndrome, bilateral upper limbs: Secondary | ICD-10-CM | POA: Diagnosis not present

## 2017-05-20 DIAGNOSIS — M5033 Other cervical disc degeneration, cervicothoracic region: Secondary | ICD-10-CM | POA: Diagnosis not present

## 2017-05-24 DIAGNOSIS — M5033 Other cervical disc degeneration, cervicothoracic region: Secondary | ICD-10-CM | POA: Diagnosis not present

## 2017-05-27 DIAGNOSIS — M5033 Other cervical disc degeneration, cervicothoracic region: Secondary | ICD-10-CM | POA: Diagnosis not present

## 2017-05-31 DIAGNOSIS — M5033 Other cervical disc degeneration, cervicothoracic region: Secondary | ICD-10-CM | POA: Diagnosis not present

## 2017-06-02 DIAGNOSIS — M542 Cervicalgia: Secondary | ICD-10-CM | POA: Diagnosis not present

## 2017-06-02 DIAGNOSIS — M545 Low back pain: Secondary | ICD-10-CM | POA: Diagnosis not present

## 2017-06-03 DIAGNOSIS — M5033 Other cervical disc degeneration, cervicothoracic region: Secondary | ICD-10-CM | POA: Diagnosis not present

## 2017-06-07 DIAGNOSIS — M5033 Other cervical disc degeneration, cervicothoracic region: Secondary | ICD-10-CM | POA: Diagnosis not present

## 2017-06-10 DIAGNOSIS — M5033 Other cervical disc degeneration, cervicothoracic region: Secondary | ICD-10-CM | POA: Diagnosis not present

## 2017-06-14 DIAGNOSIS — M5033 Other cervical disc degeneration, cervicothoracic region: Secondary | ICD-10-CM | POA: Diagnosis not present

## 2017-06-17 DIAGNOSIS — M5033 Other cervical disc degeneration, cervicothoracic region: Secondary | ICD-10-CM | POA: Diagnosis not present

## 2017-06-21 DIAGNOSIS — M5033 Other cervical disc degeneration, cervicothoracic region: Secondary | ICD-10-CM | POA: Diagnosis not present

## 2017-06-23 DIAGNOSIS — Z1231 Encounter for screening mammogram for malignant neoplasm of breast: Secondary | ICD-10-CM | POA: Diagnosis not present

## 2017-06-23 DIAGNOSIS — Z6826 Body mass index (BMI) 26.0-26.9, adult: Secondary | ICD-10-CM | POA: Diagnosis not present

## 2017-06-23 DIAGNOSIS — Z01419 Encounter for gynecological examination (general) (routine) without abnormal findings: Secondary | ICD-10-CM | POA: Diagnosis not present

## 2017-06-24 DIAGNOSIS — M5033 Other cervical disc degeneration, cervicothoracic region: Secondary | ICD-10-CM | POA: Diagnosis not present

## 2017-06-28 DIAGNOSIS — M5033 Other cervical disc degeneration, cervicothoracic region: Secondary | ICD-10-CM | POA: Diagnosis not present

## 2017-07-01 DIAGNOSIS — M5033 Other cervical disc degeneration, cervicothoracic region: Secondary | ICD-10-CM | POA: Diagnosis not present

## 2017-07-02 DIAGNOSIS — G5603 Carpal tunnel syndrome, bilateral upper limbs: Secondary | ICD-10-CM | POA: Diagnosis not present

## 2017-07-05 DIAGNOSIS — M5033 Other cervical disc degeneration, cervicothoracic region: Secondary | ICD-10-CM | POA: Diagnosis not present

## 2017-07-13 DIAGNOSIS — M5033 Other cervical disc degeneration, cervicothoracic region: Secondary | ICD-10-CM | POA: Diagnosis not present

## 2017-07-15 DIAGNOSIS — M5033 Other cervical disc degeneration, cervicothoracic region: Secondary | ICD-10-CM | POA: Diagnosis not present

## 2017-08-12 ENCOUNTER — Telehealth: Payer: Self-pay

## 2017-08-12 NOTE — Telephone Encounter (Signed)
Heather StanleyStacey, this pt is on the nurse schedule for a nurse visit. I spoke with [name] and she said d/t her medications, she needs to come in for an OV before scheduling procedure.   Will you please cancel nurse visit and schedule ov please.

## 2017-08-16 ENCOUNTER — Ambulatory Visit: Payer: BLUE CROSS/BLUE SHIELD

## 2017-09-09 DIAGNOSIS — M5033 Other cervical disc degeneration, cervicothoracic region: Secondary | ICD-10-CM | POA: Diagnosis not present

## 2017-09-15 DIAGNOSIS — M5033 Other cervical disc degeneration, cervicothoracic region: Secondary | ICD-10-CM | POA: Diagnosis not present

## 2017-09-21 DIAGNOSIS — M5033 Other cervical disc degeneration, cervicothoracic region: Secondary | ICD-10-CM | POA: Diagnosis not present

## 2017-10-06 DIAGNOSIS — M5033 Other cervical disc degeneration, cervicothoracic region: Secondary | ICD-10-CM | POA: Diagnosis not present

## 2017-10-27 ENCOUNTER — Ambulatory Visit: Payer: BLUE CROSS/BLUE SHIELD | Admitting: Gastroenterology

## 2017-10-27 ENCOUNTER — Encounter: Payer: Self-pay | Admitting: Gastroenterology

## 2017-10-27 ENCOUNTER — Other Ambulatory Visit: Payer: Self-pay

## 2017-10-27 ENCOUNTER — Telehealth: Payer: Self-pay

## 2017-10-27 DIAGNOSIS — Z1211 Encounter for screening for malignant neoplasm of colon: Secondary | ICD-10-CM | POA: Insufficient documentation

## 2017-10-27 DIAGNOSIS — T8852XA Failed moderate sedation during procedure, initial encounter: Secondary | ICD-10-CM | POA: Insufficient documentation

## 2017-10-27 DIAGNOSIS — T8852XD Failed moderate sedation during procedure, subsequent encounter: Secondary | ICD-10-CM

## 2017-10-27 MED ORDER — PEG 3350-KCL-NA BICARB-NACL 420 G PO SOLR: 4000.0000 mL | ORAL | 0 refills | Status: DC

## 2017-10-27 NOTE — Telephone Encounter (Signed)
Called and informed pt of pre-op appt 12/14/17 at 8:00am. Letter mailed.

## 2017-10-27 NOTE — Progress Notes (Signed)
CC'ED TO PCP 

## 2017-10-27 NOTE — Assessment & Plan Note (Signed)
Very pleasant 57 year old female presenting to schedule 10-year screening colonoscopy.  She has a history of waking up during her prior colonoscopy when she received conscious sedation.  She consumes 3 to 4 glasses of wine each night.  Previously on antidepressants and anxiolytics.  Recommend deep sedation with future colonoscopy.  I have discussed the risks, alternatives, benefits with regards to but not limited to the risk of reaction to medication, bleeding, infection, perforation and the patient is agreeable to proceed. Written consent to be obtained.

## 2017-10-27 NOTE — Patient Instructions (Signed)
1. Colonoscopy as scheduled. Please see separate instructions. 

## 2017-10-27 NOTE — Progress Notes (Signed)
Primary Care Physician:  Carylon Perches, MD  Primary Gastroenterologist:  Roetta Sessions, MD   Chief Complaint  Patient presents with  . Colonoscopy    consult    HPI:  Heather Davidson is a 57 y.o. female here to schedule 10-year screening colonoscopy.  Last colonoscopy in 2009.  Colonoscopy was unremarkable.  She does tell me that she woke up during the exam and she received quite a bit of conscious sedation.  She had screening celiac serologies in 2006 which were negative. Remote h/o Cdiff in 2012.   For the most part she is doing well.  No longer having diarrhea as in the past.  Typically has 1 bowel movement daily as long she takes her Colace.  No melena or rectal bleeding.  No abdominal pain.  Heartburn well controlled on ranitidine.  No dysphagia.  No unintentional weight loss.  Current Outpatient Medications  Medication Sig Dispense Refill  . aspirin 81 MG chewable tablet Chew by mouth daily.    . Biotin (BIOTIN MAXIMUM STRENGTH) 10 MG TABS Take 10,000 mg by mouth daily.    . clobetasol cream (TEMOVATE) 0.05 % Apply 1 application topically daily.    . Diethylpropion HCl CR 75 MG TB24 Take 75 mg by mouth daily.    Marland Kitchen docusate sodium (COLACE) 100 MG capsule Take 100 mg by mouth daily.    Marland Kitchen estradiol (CLIMARA - DOSED IN MG/24 HR) 0.05 mg/24hr patch Place 1 patch onto the skin 2 (two) times daily.    . eszopiclone (LUNESTA) 2 MG TABS tablet Take 2 mg by mouth at bedtime. Take immediately before bedtime    . levocetirizine (XYZAL) 5 MG tablet Take 5 mg by mouth daily.    . Lorcaserin HCl (BELVIQ) 10 MG TABS Take 1 tablet by mouth 2 (two) times daily.    Marland Kitchen losartan (COZAAR) 100 MG tablet Take 100 mg by mouth daily.      . montelukast (SINGULAIR) 10 MG tablet Take 10 mg by mouth at bedtime.     . Multiple Vitamin (MULTIVITAMIN) capsule Take 1 capsule by mouth daily.    . Probiotic Product (REZYST IM) CHEW Chew 1 tablet by mouth daily. 30 tablet 11  . progesterone (PROMETRIUM) 100 MG capsule  Take 1 capsule by mouth daily.    . ranitidine (ZANTAC) 150 MG capsule Take 150 mg by mouth daily. Occ takes bid    . RESTASIS 0.05 % ophthalmic emulsion Place 1 drop into both eyes 2 (two) times daily.    Marland Kitchen topiramate (TOPAMAX) 25 MG capsule Take 25 mg by mouth daily.     . TURMERIC PO Take by mouth daily.    . vitamin B-12 (CYANOCOBALAMIN) 1000 MCG tablet Take 1,000 mcg by mouth daily.     No current facility-administered medications for this visit.   75  Allergies as of 10/27/2017 - Review Complete 10/27/2017  Allergen Reaction Noted  . Ciprofloxacin Nausea And Vomiting 09/30/2010    Past Medical History:  Diagnosis Date  . Anxiety   . Clostridium difficile colitis 12/2010  . Depression   . GERD (gastroesophageal reflux disease)   . HTN (hypertension)   . Seasonal allergies     Past Surgical History:  Procedure Laterality Date  . CERVICAL CONIZATION W/BX     for precervical cancer in remote past  . CESAREAN SECTION    . COLONOSCOPY  09/2007   anal papilla, normal colon and terminal ileum  . complete hysterectomy    . ESOPHAGOGASTRODUODENOSCOPY  09/2007   solitary inlet patch (bx neg), antral erosions  . TUBAL LIGATION      Family History  Problem Relation Age of Onset  . Lung cancer Father        asbestosis  . COPD Mother   . Colon cancer Neg Hx     Social History   Socioeconomic History  . Marital status: Married    Spouse name: Not on file  . Number of children: 1  . Years of education: Not on file  . Highest education level: Not on file  Occupational History    Employer: [employer]  Social Needs  . Financial resource strain: Not on file  . Food insecurity:    Worry: Not on file    Inability: Not on file  . Transportation needs:    Medical: Not on file    Non-medical: Not on file  Tobacco Use  . Smoking status: Current Every Day Smoker    Packs/day: 0.50    Years: 25.00    Pack years: 12.50    Types: Cigarettes  . Smokeless tobacco: Never  Used  Substance and Sexual Activity  . Alcohol use: Yes    Alcohol/week: 0.0 oz    Comment: 4 glass of red wine a night   . Drug use: No  . Sexual activity: Not on file  Lifestyle  . Physical activity:    Days per week: Not on file    Minutes per session: Not on file  . Stress: Not on file  Relationships  . Social connections:    Talks on phone: Not on file    Gets together: Not on file    Attends religious service: Not on file    Active member of club or organization: Not on file    Attends meetings of clubs or organizations: Not on file    Relationship status: Not on file  . Intimate partner violence:    Fear of current or ex partner: Not on file    Emotionally abused: Not on file    Physically abused: Not on file    Forced sexual activity: Not on file  Other Topics Concern  . Not on file  Social History Narrative   Lives w/ husband   1 grown healthy son      ROS:  General: Negative for anorexia, weight loss, fever, chills, fatigue, weakness. Eyes: Negative for vision changes.  ENT: Negative for hoarseness, difficulty swallowing , nasal congestion. CV: Negative for chest pain, angina, palpitations, dyspnea on exertion, peripheral edema.  Respiratory: Negative for dyspnea at rest, dyspnea on exertion, cough, sputum, wheezing.  GI: See history of present illness. GU:  Negative for dysuria, hematuria, urinary incontinence, urinary frequency, nocturnal urination.  MS: Negative for joint pain, low back pain.  Derm: Negative for rash or itching.  Neuro: Negative for weakness, abnormal sensation, seizure, frequent headaches, memory loss, confusion.  Psych: Negative for anxiety, depression, suicidal ideation, hallucinations.  Endo: Negative for unusual weight change.  Heme: Negative for bruising or bleeding. Allergy: Negative for rash or hives.    Physical Examination:  BP 130/80   Pulse 85   Temp 97.9 F (36.6 C) (Oral)   Ht 5\' 7"  (1.702 m)   Wt 170 lb (77.1 kg)    BMI 26.63 kg/m    General: Well-nourished, well-developed in no acute distress.  Head: Normocephalic, atraumatic.   Eyes: Conjunctiva pink, no icterus. Mouth: Oropharyngeal mucosa moist and pink , no lesions erythema or exudate. Neck: Supple  without thyromegaly, masses, or lymphadenopathy.  Lungs: Clear to auscultation bilaterally.  Heart: Regular rate and rhythm, no murmurs rubs or gallops.  Abdomen: Bowel sounds are normal, nontender, nondistended, no hepatosplenomegaly or masses, no abdominal bruits or    hernia , no rebound or guarding.   Rectal: Deferred Extremities: No lower extremity edema. No clubbing or deformities.  Neuro: Alert and oriented x 4 , grossly normal neurologically.  Skin: Warm and dry, no rash or jaundice.   Psych: Alert and cooperative, normal mood and affect.

## 2017-11-16 DIAGNOSIS — E785 Hyperlipidemia, unspecified: Secondary | ICD-10-CM | POA: Diagnosis not present

## 2017-11-16 DIAGNOSIS — Z79899 Other long term (current) drug therapy: Secondary | ICD-10-CM | POA: Diagnosis not present

## 2017-11-16 DIAGNOSIS — I10 Essential (primary) hypertension: Secondary | ICD-10-CM | POA: Diagnosis not present

## 2017-11-16 DIAGNOSIS — R7301 Impaired fasting glucose: Secondary | ICD-10-CM | POA: Diagnosis not present

## 2017-11-23 DIAGNOSIS — I10 Essential (primary) hypertension: Secondary | ICD-10-CM | POA: Diagnosis not present

## 2017-11-23 DIAGNOSIS — Z6827 Body mass index (BMI) 27.0-27.9, adult: Secondary | ICD-10-CM | POA: Diagnosis not present

## 2017-11-23 DIAGNOSIS — Z0001 Encounter for general adult medical examination with abnormal findings: Secondary | ICD-10-CM | POA: Diagnosis not present

## 2017-11-23 DIAGNOSIS — E785 Hyperlipidemia, unspecified: Secondary | ICD-10-CM | POA: Diagnosis not present

## 2017-12-07 ENCOUNTER — Other Ambulatory Visit: Payer: Self-pay | Admitting: Internal Medicine

## 2017-12-07 DIAGNOSIS — F17210 Nicotine dependence, cigarettes, uncomplicated: Secondary | ICD-10-CM

## 2017-12-07 NOTE — Progress Notes (Addendum)
Cardiology Office Note   Date:  12/08/2017   ID:  Heather Davidson, DOB [birth year], MRN [id number]  PCP:  Carylon Perches, MD  Cardiologist:   No primary care provider on file. Referring:  Carylon Perches, MD  Chief Complaint  Patient presents with  . Elevated Coronary Calcium      History of Present Illness: Heather Davidson is a 57 y.o. female who was referred by Carylon Perches, MD for evaluation of elevated coronary calcium.   She is being followed for pulmonary nodules.  She was found to have coronary calcium sensibly in the LAD last year CT.  This year she is found to have dyslipidemia and so she is referred for further evaluation.  She is somewhat limited by back pain but she takes care of her 23-year-old grandson Hudson.  She does lifting chasing after him but otherwise not a lot of physical activity.  With this she denies any cardiovascular symptoms. The patient denies any new symptoms such as chest discomfort, neck or arm discomfort. There has been no new shortness of breath, PND or orthopnea. There have been no reported palpitations, presyncope or syncope.   She has been noted recently to have increased LDL and was started on Lipitor.  She has a history of cigarette smoking.  Her blood pressures have been slightly elevated recently as well.  She is never had any other cardiovascular testing.  Past Medical History:  Diagnosis Date  . Anxiety   . Clostridium difficile colitis 12/2010  . Depression   . GERD (gastroesophageal reflux disease)   . HTN (hypertension)   . Seasonal allergies     Past Surgical History:  Procedure Laterality Date  . ABDOMINAL HYSTERECTOMY    . CERVICAL CONIZATION W/BX     for precervical cancer in remote past  . CESAREAN SECTION    . COLONOSCOPY  09/2007   anal papilla, normal colon and terminal ileum  . complete hysterectomy    . ESOPHAGOGASTRODUODENOSCOPY  09/2007   solitary inlet patch (bx neg), antral erosions  . TUBAL LIGATION       Current  Outpatient Medications  Medication Sig Dispense Refill  . aspirin 81 MG chewable tablet Chew by mouth daily.    Marland Kitchen atorvastatin (LIPITOR) 20 MG tablet Take 20 mg by mouth daily.    . Biotin (BIOTIN MAXIMUM STRENGTH) 10 MG TABS Take 10,000 mg by mouth daily.    . clobetasol cream (TEMOVATE) 0.05 % Apply 1 application topically daily.    . Diethylpropion HCl CR 75 MG TB24 Take 75 mg by mouth daily.    Marland Kitchen docusate sodium (COLACE) 100 MG capsule Take 100 mg by mouth daily.    Marland Kitchen estradiol (CLIMARA - DOSED IN MG/24 HR) 0.05 mg/24hr patch Place 1 patch onto the skin 2 (two) times a week.     . eszopiclone (LUNESTA) 2 MG TABS tablet Take 2 mg by mouth at bedtime. Take immediately before bedtime    . levocetirizine (XYZAL) 5 MG tablet Take 5 mg by mouth daily.    Marland Kitchen losartan (COZAAR) 100 MG tablet Take 100 mg by mouth daily.      . montelukast (SINGULAIR) 10 MG tablet Take 10 mg by mouth at bedtime.     . Multiple Vitamin (MULTIVITAMIN) capsule Take 1 capsule by mouth daily.    Marland Kitchen PHENTERMINE HCL PO Take 1 tablet by mouth daily. Phentermine - chromium 37.5 mg-200 mcg    . Probiotic Product (REZYST IM) CHEW Chew 1  tablet by mouth daily. 30 tablet 11  . progesterone (PROMETRIUM) 100 MG capsule Take 1 capsule by mouth daily.    . ranitidine (ZANTAC) 150 MG capsule Take 150 mg by mouth daily. Occ takes bid    . RESTASIS 0.05 % ophthalmic emulsion Place 1 drop into both eyes 2 (two) times daily.    Marland Kitchen topiramate (TOPAMAX) 25 MG capsule Take 25 mg by mouth daily.     . TURMERIC PO Take 1 capsule by mouth daily.     . vitamin B-12 (CYANOCOBALAMIN) 1000 MCG tablet Take 1,000 mcg by mouth daily.    . chlorthalidone (HYGROTON) 25 MG tablet Take 1 tablet (25 mg total) by mouth daily. 90 tablet 3  . varenicline (CHANTIX CONTINUING MONTH PAK) 1 MG tablet Take 1 tablet (1 mg total) by mouth 2 (two) times daily. 60 tablet 1  . varenicline (CHANTIX STARTING MONTH PAK) 0.5 MG X 11 & 1 MG X 42 tablet Take one 0.5 mg tablet  by mouth once daily for 3 days, then increase to one 0.5 mg tablet twice daily for 4 days, then increase to one 1 mg tablet twice daily. 53 tablet 0   No current facility-administered medications for this visit.     Allergies:   Ciprofloxacin    Social History:  The patient  reports that she has been smoking cigarettes.  She has a 12.50 pack-year smoking history. She has never used smokeless tobacco. She reports that she drinks alcohol. She reports that she does not use drugs.   Family History:  The patient's family history includes COPD in her mother; Lung cancer in her father.    ROS:  Please see the history of present illness.   Otherwise, review of systems are positive for none.   All other systems are reviewed and negative.    PHYSICAL EXAM: VS:  BP (!) 156/88   Pulse 87   Ht 5\' 6"  (1.676 m)   Wt 164 lb 12.8 oz (74.8 kg)   BMI 26.60 kg/m  , BMI Body mass index is 26.6 kg/m. GENERAL:  Well appearing HEENT:  Pupils equal round and reactive, fundi not visualized, oral mucosa unremarkable NECK:  No jugular venous distention, waveform within normal limits, carotid upstroke brisk and symmetric, no bruits, no thyromegaly LYMPHATICS:  No cervical, inguinal adenopathy LUNGS:  Clear to auscultation bilaterally BACK:  No CVA tenderness CHEST:  Unremarkable HEART:  PMI not displaced or sustained,S1 and S2 within normal limits, no S3, no S4, no clicks, no rubs, no murmurs ABD:  Flat, positive bowel sounds normal in frequency in pitch, no bruits, no rebound, no guarding, no midline pulsatile mass, no hepatomegaly, no splenomegaly EXT:  2 plus pulses throughout, no edema, no cyanosis no clubbing SKIN:  No rashes no nodules NEURO:  Cranial nerves II through XII grossly intact, motor grossly intact throughout PSYCH:  Cognitively intact, oriented to person place and time    EKG:  EKG is ordered today. The ekg ordered today demonstrates sinus rhythm, rate 87, axis within normal limits,  intervals within normal limits, no acute ST-T wave changes.   Recent Labs: No results found for requested labs within last 8760 hours.    Lipid Panel No results found for: CHOL, TRIG, HDL, CHOLHDL, VLDL, LDLCALC, LDLDIRECT    Wt Readings from Last 3 Encounters:  12/08/17 164 lb 12.8 oz (74.8 kg)  10/27/17 170 lb (77.1 kg)  01/09/15 171 lb (77.6 kg)      Other studies Reviewed:  Additional studies/ records that were reviewed today include: . Review of the above records demonstrates:  Please see elsewhere in the note.     ASSESSMENT AND PLAN:  ELEVATED CORONARY CALCIUM: Given the coronary calcium, significant risk factors she needs screening. I will bring the patient back for a POET (Plain Old Exercise Test). This will allow me to screen for obstructive coronary disease, risk stratify and very importantly provide a prescription for exercise.  DYSLIPIDEMIA:    We talked about diet and exercise.  She was just started on a statin although I think the Lipitor probably will need to be be increased but she can get a lipid profile again in 8 weeks and make a determination.    HTN:  I am going to add chlorthalidone 25 mg daily.    TOBACCO ABUSE:  She does want to try Chantix.  We discussed all of the potential side effects and in particular I reviewed the [employer] Warning.  He has no depression.  He understands and wishes to try this medication.   Current medicines are reviewed at length with the patient today.  The patient does not have concerns regarding medicines.  The following changes have been made:  As above.   Labs/ tests ordered today include:    Orders Placed This Encounter  Procedures  . EXERCISE TOLERANCE TEST (ETT)  . EKG 12-Lead     Disposition:   FU with me as needed     Signed, Rollene RotundaJames Srihaan Mastrangelo, MD  12/08/2017 1:32 PM    [facility]

## 2017-12-08 ENCOUNTER — Encounter: Payer: Self-pay | Admitting: Cardiology

## 2017-12-08 ENCOUNTER — Ambulatory Visit: Payer: BLUE CROSS/BLUE SHIELD | Admitting: Cardiology

## 2017-12-08 VITALS — BP 156/88 | HR 87 | Ht 66.0 in | Wt 164.8 lb

## 2017-12-08 DIAGNOSIS — I10 Essential (primary) hypertension: Secondary | ICD-10-CM | POA: Diagnosis not present

## 2017-12-08 DIAGNOSIS — E785 Hyperlipidemia, unspecified: Secondary | ICD-10-CM

## 2017-12-08 DIAGNOSIS — I2510 Atherosclerotic heart disease of native coronary artery without angina pectoris: Secondary | ICD-10-CM | POA: Diagnosis not present

## 2017-12-08 DIAGNOSIS — Z720 Tobacco use: Secondary | ICD-10-CM

## 2017-12-08 MED ORDER — CHLORTHALIDONE 25 MG PO TABS: 25.0000 mg | ORAL_TABLET | Freq: Every day | ORAL | 3 refills | Status: DC

## 2017-12-08 MED ORDER — VARENICLINE TARTRATE 1 MG PO TABS: 1.0000 mg | ORAL_TABLET | Freq: Two times a day (BID) | ORAL | 1 refills | Status: DC

## 2017-12-08 MED ORDER — VARENICLINE TARTRATE 0.5 MG X 11 & 1 MG X 42 PO MISC: ORAL | 0 refills | Status: DC

## 2017-12-08 NOTE — Patient Instructions (Signed)
Medication Instructions:  Your physician has recommended you make the following change in your medication:  1) START Chlorthalidone 25 mg tablet by mouth ONCE daily 2) START Chantix - STARTER Pack and Continuation pack sent to pharmacy.   Labwork: none  Testing/Procedures: Your physician has requested that you have an exercise tolerance test. For further information please visit [email or web address] Please also follow instruction sheet, as given.    Follow-Up: Follow up with Dr. Antoine PocheHochrein as needed.   Any Other Special Instructions Will Be Listed Below (If Applicable).     If you need a refill on your cardiac medications before your next appointment, please call your pharmacy.

## 2017-12-09 ENCOUNTER — Other Ambulatory Visit: Payer: Self-pay | Admitting: Orthopedic Surgery

## 2017-12-13 NOTE — Patient Instructions (Addendum)
Heather Davidson  12/13/2017     @PREFPERIOPPHARMACY @   Your procedure is scheduled on  12/20/2017   [facility]  NOON  Call this number if you have problems the morning of surgery:  [phone]   Remember:  Do not eat or drink after midnight.  You may drink clear liquids until (follow the instructions given to you) .  Clear liquids allowed are:                    Water, Juice (non-citric and without pulp), Carbonated beverages, Clear Tea, Black Coffee only, Plain Jell-O only, Gatorade and Plain Popsicles only    Take these medicines the morning of surgery with A SIP OF WATER   Hygronton, xyzal, losartan, singulair, zantac, topamax.    Do not wear jewelry, make-up or nail polish.  Do not wear lotions, powders, or perfumes, or deodorant.  Do not shave 48 hours prior to surgery.  Men may shave face and neck.  Do not bring valuables to the hospital.  [facility] is not responsible for any belongings or valuables.  Contacts, dentures or bridgework may not be worn into surgery.  Leave your suitcase in the car.  After surgery it may be brought to your room.  For patients admitted to the hospital, discharge time will be determined by your treatment team.  Patients discharged the day of surgery will not be allowed to drive home.   Name and phone number of your driver:   family Special instructions:  Follow the diet and prep instructions given to you by Dr Luvenia Starchourk's office.  Please read over the following fact sheets that you were given. Anesthesia Post-op Instructions and Care and Recovery After Surgery       Colonoscopy, Adult A colonoscopy is an exam to look at the large intestine. It is done to check for problems, such as:  Lumps (tumors).  Growths (polyps).  Swelling (inflammation).  Bleeding.  What happens before the procedure? Eating and drinking Follow instructions from your doctor about eating and drinking. These instructions may  include:  A few days before the procedure - follow a low-fiber diet. ? Avoid nuts. ? Avoid seeds. ? Avoid dried fruit. ? Avoid raw fruits. ? Avoid vegetables.  1-3 days before the procedure - follow a clear liquid diet. Avoid liquids that have red or purple dye. Drink only clear liquids, such as: ? Clear broth or bouillon. ? Black coffee or tea. ? Clear juice. ? Clear soft drinks or sports drinks. ? Gelatin dessert. ? Popsicles.  On the day of the procedure - do not eat or drink anything during the 2 hours before the procedure.  Bowel prep If you were prescribed an oral bowel prep:  Take it as told by your doctor. Starting the day before your procedure, you will need to drink a lot of liquid. The liquid will cause you to poop (have bowel movements) until your poop is almost clear or light green.  If your skin or butt gets irritated from diarrhea, you may: ? Wipe the area with wipes that have medicine in them, such as adult wet wipes with aloe and vitamin E. ? Put something on your skin that soothes the area, such as petroleum jelly.  If you throw up (vomit) while drinking the bowel prep, take a break for up to 60 minutes. Then begin the bowel prep again. If you  keep throwing up and you cannot take the bowel prep without throwing up, call your doctor.  General instructions  Ask your doctor about changing or stopping your normal medicines. This is important if you take diabetes medicines or blood thinners.  Plan to have someone take you home from the hospital or clinic. What happens during the procedure?  An IV tube may be put into one of your veins.  You will be given medicine to help you relax (sedative).  To reduce your risk of infection: ? Your doctors will wash their hands. ? Your anal area will be washed with soap.  You will be asked to lie on your side with your knees bent.  Your doctor will get a long, thin, flexible tube ready. The tube will have a camera and a  light on the end.  The tube will be put into your anus.  The tube will be gently put into your large intestine.  Air will be delivered into your large intestine to keep it open. You may feel some pressure or cramping.  The camera will be used to take photos.  A small tissue sample may be removed from your body to be looked at under a microscope (biopsy). If any possible problems are found, the tissue will be sent to a lab for testing.  If small growths are found, your doctor may remove them and have them checked for cancer.  The tube that was put into your anus will be slowly removed. The procedure may vary among doctors and hospitals. What happens after the procedure?  Your doctor will check on you often until the medicines you were given have worn off.  Do not drive for 24 hours after the procedure.  You may have a small amount of blood in your poop.  You may pass gas.  You may have mild cramps or bloating in your belly (abdomen).  It is up to you to get the results of your procedure. Ask your doctor, or the department performing the procedure, when your results will be ready. This information is not intended to replace advice given to you by your health care provider. Make sure you discuss any questions you have with your health care provider. Document Released: 05/23/2010 Document Revised: 02/19/2016 Document Reviewed: 07/02/2015 Elsevier Interactive Patient Education  2017 Elsevier Inc[employer]  Colonoscopy, Adult, Care After This sheet gives you information about how to care for yourself after your procedure. Your health care provider may also give you more specific instructions. If you have problems or questions, contact your health care provider. What can I expect after the procedure? After the procedure, it is common to have:  A small amount of blood in your stool for 24 hours after the procedure.  Some gas.  Mild abdominal cramping or bloating.  Follow these  instructions at home: General instructions   For the first 24 hours after the procedure: ? Do not drive or use machinery. ? Do not sign important documents. ? Do not drink alcohol. ? Do your regular daily activities at a slower pace than normal. ? Eat soft, easy-to-digest foods. ? Rest often.  Take over-the-counter or prescription medicines only as told by your health care provider.  It is up to you to get the results of your procedure. Ask your health care provider, or the department performing the procedure, when your results will be ready. Relieving cramping and bloating  Try walking around when you have cramps or feel bloated.  Apply heat to  your abdomen as told by your health care provider. Use a heat source that your health care provider recommends, such as a moist heat pack or a heating pad. ? Place a towel between your skin and the heat source. ? Leave the heat on for 20-30 minutes. ? Remove the heat if your skin turns bright red. This is especially important if you are unable to feel pain, heat, or cold. You may have a greater risk of getting burned. Eating and drinking  Drink enough fluid to keep your urine clear or pale yellow.  Resume your normal diet as instructed by your health care provider. Avoid heavy or fried foods that are hard to digest.  Avoid drinking alcohol for as long as instructed by your health care provider. Contact a health care provider if:  You have blood in your stool 2-3 days after the procedure. Get help right away if:  You have more than a small spotting of blood in your stool.  You pass large blood clots in your stool.  Your abdomen is swollen.  You have nausea or vomiting.  You have a fever.  You have increasing abdominal pain that is not relieved with medicine. This information is not intended to replace advice given to you by your health care provider. Make sure you discuss any questions you have with your health care  provider. Document Released: 12/03/2003 Document Revised: 01/13/2016 Document Reviewed: 07/02/2015 Elsevier Interactive Patient Education  2018 [place] Anesthesia is a term that refers to techniques, procedures, and medicines that help a person stay safe and comfortable during a medical procedure. Monitored anesthesia care, or sedation, is one type of anesthesia. Your anesthesia specialist may recommend sedation if you will be having a procedure that does not require you to be unconscious, such as:  Cataract surgery.  A dental procedure.  A biopsy.  A colonoscopy.  During the procedure, you may receive a medicine to help you relax (sedative). There are three levels of sedation:  Mild sedation. At this level, you may feel awake and relaxed. You will be able to follow directions.  Moderate sedation. At this level, you will be sleepy. You may not remember the procedure.  Deep sedation. At this level, you will be asleep. You will not remember the procedure.  The more medicine you are given, the deeper your level of sedation will be. Depending on how you respond to the procedure, the anesthesia specialist may change your level of sedation or the type of anesthesia to fit your needs. An anesthesia specialist will monitor you closely during the procedure. Let your health care provider know about:  Any allergies you have.  All medicines you are taking, including vitamins, herbs, eye drops, creams, and over-the-counter medicines.  Any use of steroids (by mouth or as a cream).  Any problems you or family members have had with sedatives and anesthetic medicines.  Any blood disorders you have.  Any surgeries you have had.  Any medical conditions you have, such as sleep apnea.  Whether you are pregnant or may be pregnant.  Any use of cigarettes, alcohol, or street drugs. What are the risks? Generally, this is a safe procedure. However, problems may  occur, including:  Getting too much medicine (oversedation).  Nausea.  Allergic reaction to medicines.  Trouble breathing. If this happens, a breathing tube may be used to help with breathing. It will be removed when you are awake and breathing on your own.  Heart trouble.  Lung trouble.  Before the procedure Staying hydrated Follow instructions from your health care provider about hydration, which may include:  Up to 2 hours before the procedure - you may continue to drink clear liquids, such as water, clear fruit juice, black coffee, and plain tea.  Eating and drinking restrictions Follow instructions from your health care provider about eating and drinking, which may include:  8 hours before the procedure - stop eating heavy meals or foods such as meat, fried foods, or fatty foods.  6 hours before the procedure - stop eating light meals or foods, such as toast or cereal.  6 hours before the procedure - stop drinking milk or drinks that contain milk.  2 hours before the procedure - stop drinking clear liquids.  Medicines Ask your health care provider about:  Changing or stopping your regular medicines. This is especially important if you are taking diabetes medicines or blood thinners.  Taking medicines such as aspirin and ibuprofen. These medicines can thin your blood. Do not take these medicines before your procedure if your health care provider instructs you not to.  Tests and exams  You will have a physical exam.  You may have blood tests done to show: ? How well your kidneys and liver are working. ? How well your blood can clot.  General instructions  Plan to have someone take you home from the hospital or clinic.  If you will be going home right after the procedure, plan to have someone with you for 24 hours.  What happens during the procedure?  Your blood pressure, heart rate, breathing, level of pain and overall condition will be monitored.  An IV  tube will be inserted into one of your veins.  Your anesthesia specialist will give you medicines as needed to keep you comfortable during the procedure. This may mean changing the level of sedation.  The procedure will be performed. After the procedure  Your blood pressure, heart rate, breathing rate, and blood oxygen level will be monitored until the medicines you were given have worn off.  Do not drive for 24 hours if you received a sedative.  You may: ? Feel sleepy, clumsy, or nauseous. ? Feel forgetful about what happened after the procedure. ? Have a sore throat if you had a breathing tube during the procedure. ? Vomit. This information is not intended to replace advice given to you by your health care provider. Make sure you discuss any questions you have with your health care provider. Document Released: 01/14/2005 Document Revised: 09/27/2015 Document Reviewed: 08/11/2015 Elsevier Interactive Patient Education  2018 [place], Care After These instructions provide you with information about caring for yourself after your procedure. Your health care provider may also give you more specific instructions. Your treatment has been planned according to current medical practices, but problems sometimes occur. Call your health care provider if you have any problems or questions after your procedure. What can I expect after the procedure? After your procedure, it is common to:  Feel sleepy for several hours.  Feel clumsy and have poor balance for several hours.  Feel forgetful about what happened after the procedure.  Have poor judgment for several hours.  Feel nauseous or vomit.  Have a sore throat if you had a breathing tube during the procedure.  Follow these instructions at home: For at least 24 hours after the procedure:   Do not: ? Participate in activities in which you could fall or become injured. ?  Drive. ? Use heavy  machinery. ? Drink alcohol. ? Take sleeping pills or medicines that cause drowsiness. ? Make important decisions or sign legal documents. ? Take care of children on your own.  Rest. Eating and drinking  Follow the diet that is recommended by your health care provider.  If you vomit, drink water, juice, or soup when you can drink without vomiting.  Make sure you have little or no nausea before eating solid foods. General instructions  Have a responsible adult stay with you until you are awake and alert.  Take over-the-counter and prescription medicines only as told by your health care provider.  If you smoke, do not smoke without supervision.  Keep all follow-up visits as told by your health care provider. This is important. Contact a health care provider if:  You keep feeling nauseous or you keep vomiting.  You feel light-headed.  You develop a rash.  You have a fever. Get help right away if:  You have trouble breathing. This information is not intended to replace advice given to you by your health care provider. Make sure you discuss any questions you have with your health care provider. Document Released: 08/11/2015 Document Revised: 12/11/2015 Document Reviewed: 08/11/2015 Elsevier Interactive Patient Education  [employer].

## 2017-12-14 ENCOUNTER — Other Ambulatory Visit: Payer: Self-pay

## 2017-12-14 ENCOUNTER — Telehealth: Payer: Self-pay

## 2017-12-14 ENCOUNTER — Encounter (HOSPITAL_COMMUNITY): Payer: Self-pay

## 2017-12-14 ENCOUNTER — Encounter (HOSPITAL_COMMUNITY)
Admission: RE | Admit: 2017-12-14 | Discharge: 2017-12-14 | Disposition: A | Discharge status: Home / Self Care | Payer: BLUE CROSS/BLUE SHIELD | Source: Ambulatory Visit | Attending: Internal Medicine | Admitting: Internal Medicine

## 2017-12-14 DIAGNOSIS — Z01818 Encounter for other preprocedural examination: Secondary | ICD-10-CM | POA: Insufficient documentation

## 2017-12-14 DIAGNOSIS — Z1211 Encounter for screening for malignant neoplasm of colon: Secondary | ICD-10-CM | POA: Insufficient documentation

## 2017-12-14 LAB — BASIC METABOLIC PANEL
Anion gap: 9 (ref 5–15)
BUN: 14 mg/dL (ref 6–20)
CHLORIDE: 90 mmol/L — AB (ref 98–111)
CO2: 30 mmol/L (ref 22–32)
Calcium: 9.5 mg/dL (ref 8.9–10.3)
Creatinine, Ser: 0.75 mg/dL (ref 0.44–1.00)
GFR calc Af Amer: >60 mL/min (ref >60)
GFR calc non Af Amer: >60 mL/min (ref >60)
GLUCOSE: 124 mg/dL — AB (ref 70–99)
POTASSIUM: 3 mmol/L — AB (ref 3.5–5.1)
Sodium: 129 mmol/L — ABNORMAL LOW (ref 135–145)

## 2017-12-14 LAB — CBC
HEMATOCRIT: 43.5 % (ref 36.0–46.0)
HEMOGLOBIN: 15.2 g/dL — AB (ref 12.0–15.0)
MCH: 34.5 pg — ABNORMAL HIGH (ref 26.0–34.0)
MCHC: 34.9 g/dL (ref 30.0–36.0)
MCV: 98.6 fL (ref 78.0–100.0)
Platelets: 277 10*3/uL (ref 150–400)
RBC: 4.41 MIL/uL (ref 3.87–5.11)
RDW: 12.1 % (ref 11.5–15.5)
WBC: 10.3 10*3/uL (ref 4.0–10.5)

## 2017-12-14 MED ORDER — POTASSIUM CHLORIDE ER 20 MEQ PO TBCR: 20.0000 meq | EXTENDED_RELEASE_TABLET | Freq: Two times a day (BID) | ORAL | 0 refills | Status: DC

## 2017-12-14 NOTE — Telephone Encounter (Signed)
Kim from short stay called. Pt has a low potassium of 3. Pt's TCS is scheduled for 8/19. They would like pt to start supplements.

## 2017-12-14 NOTE — Pre-Procedure Instructions (Signed)
Pot[facility] 3.0 [facility] She will send message to PA and have one of them address potassium level. Dr Sharee PimpleWynn also aware of potassium and above information. He wants Istat on arrival the morning of procedure.

## 2017-12-14 NOTE — Addendum Note (Signed)
Addended by: Tiffany KocherLEWIS, Rameen Quinney S on: 12/14/2017 12:59 PM   Modules accepted: Orders

## 2017-12-14 NOTE — Telephone Encounter (Signed)
Noted. Pt notified.  

## 2017-12-14 NOTE — Telephone Encounter (Signed)
Please let patient know we sent in RX for potassium chloride TABLET po BID for three days.   Needs to start today.

## 2017-12-15 ENCOUNTER — Telehealth (HOSPITAL_COMMUNITY): Payer: Self-pay

## 2017-12-15 NOTE — Telephone Encounter (Signed)
Encounter complete. 

## 2017-12-15 NOTE — Telephone Encounter (Signed)
Spoke with pt advised her to stop Chlorthalidone and keep a regular blood pressure diary for the next 2 week and bring it by out office, pt voice understanding.

## 2017-12-16 ENCOUNTER — Telehealth: Payer: Self-pay | Admitting: Internal Medicine

## 2017-12-16 ENCOUNTER — Telehealth: Payer: Self-pay | Admitting: *Deleted

## 2017-12-16 NOTE — Telephone Encounter (Signed)
Patient called and said she thinks her potassium was low because of a fluid pill she was taking, lsl put her on a potassium pill and she has some questions [phone]

## 2017-12-16 NOTE — Telephone Encounter (Signed)
Potassium could be low due to her fluid pill. Complete potassium as prescribed. Hopefully energy level will improve with stopping fluid pill and repleting potassium.   Its up to her if she wants to cancel colonoscopy or not.  It is a screening exam so I will leave that up to her.

## 2017-12-16 NOTE — Telephone Encounter (Signed)
Per pt was notified to stop Chlorthalidone and keep a b/p log and contact office with readings ./cy

## 2017-12-16 NOTE — Telephone Encounter (Signed)
Spoke with pt. She will call back tomorrow if she decides to cancel.

## 2017-12-16 NOTE — Telephone Encounter (Signed)
LSL. Pt started Potassium tabs on Tuesday as directed. Pt had her physical two weeks ago and her potassium level was normal. Pt was given a fluid pill which was started on 12/10/17 and pt d/c today 12/16/17 after talking with her PCP. She feels her potassium dropped due to taking the fluid pill. Pt feels like her energy level has dropped, she's very tired and isn't sure is she should do the TCS on Monday 12/20/17 feeling the way.

## 2017-12-17 ENCOUNTER — Ambulatory Visit (HOSPITAL_COMMUNITY)
Admission: RE | Admit: 2017-12-17 | Discharge: 2017-12-17 | Disposition: A | Discharge status: Home / Self Care | Payer: BLUE CROSS/BLUE SHIELD | Source: Ambulatory Visit | Attending: Cardiology | Admitting: Cardiology

## 2017-12-17 ENCOUNTER — Ambulatory Visit: Payer: BLUE CROSS/BLUE SHIELD

## 2017-12-17 DIAGNOSIS — I2510 Atherosclerotic heart disease of native coronary artery without angina pectoris: Secondary | ICD-10-CM | POA: Insufficient documentation

## 2017-12-17 LAB — EXERCISE TOLERANCE TEST
CHL CUP MPHR: 162 {beats}/min
CHL CUP RESTING HR STRESS: 88 {beats}/min
CSEPEW: 10.5 METS
CSEPHR: 96 %
CSEPPHR: 157 {beats}/min
Exercise duration (min): 9 min
Exercise duration (sec): 18 s
RPE: 17

## 2017-12-17 NOTE — Telephone Encounter (Signed)
Pt called office and LMOVM requesting to reschedule TCS that was for 12/20/17. Called pt back, TCS w/Propofol w/RMR rescheduled to 02/10/18 at 11:00am (pt was unable to do several dates that were offered).   Called and informed Endo scheduler. New pre-op appt 02/07/18 at 10:00am. Called pt back and informed of pre-op appt. Appt letter and new procedure instructions mailed.

## 2017-12-31 ENCOUNTER — Ambulatory Visit: Payer: BLUE CROSS/BLUE SHIELD

## 2017-12-31 ENCOUNTER — Ambulatory Visit
Admission: RE | Admit: 2017-12-31 | Discharge: 2017-12-31 | Disposition: A | Discharge status: Home / Self Care | Payer: BLUE CROSS/BLUE SHIELD | Source: Ambulatory Visit | Attending: Internal Medicine | Admitting: Internal Medicine

## 2017-12-31 DIAGNOSIS — F17210 Nicotine dependence, cigarettes, uncomplicated: Secondary | ICD-10-CM | POA: Diagnosis not present

## 2018-01-04 ENCOUNTER — Encounter (HOSPITAL_BASED_OUTPATIENT_CLINIC_OR_DEPARTMENT_OTHER): Payer: Self-pay

## 2018-01-04 ENCOUNTER — Other Ambulatory Visit: Payer: Self-pay

## 2018-01-07 ENCOUNTER — Other Ambulatory Visit: Payer: Self-pay

## 2018-01-07 ENCOUNTER — Ambulatory Visit (HOSPITAL_BASED_OUTPATIENT_CLINIC_OR_DEPARTMENT_OTHER)
Admission: RE | Admit: 2018-01-07 | Discharge: 2018-01-07 | Disposition: A | Discharge status: Home / Self Care | Payer: BLUE CROSS/BLUE SHIELD | Source: Ambulatory Visit | Attending: Orthopedic Surgery | Admitting: Orthopedic Surgery

## 2018-01-07 ENCOUNTER — Encounter (HOSPITAL_BASED_OUTPATIENT_CLINIC_OR_DEPARTMENT_OTHER)
Admission: RE | Disposition: A | Discharge status: Home / Self Care | Payer: Self-pay | Source: Ambulatory Visit | Attending: Orthopedic Surgery

## 2018-01-07 ENCOUNTER — Ambulatory Visit (HOSPITAL_BASED_OUTPATIENT_CLINIC_OR_DEPARTMENT_OTHER): Payer: BLUE CROSS/BLUE SHIELD | Admitting: Anesthesiology

## 2018-01-07 ENCOUNTER — Encounter (HOSPITAL_BASED_OUTPATIENT_CLINIC_OR_DEPARTMENT_OTHER): Payer: Self-pay | Admitting: *Deleted

## 2018-01-07 DIAGNOSIS — F419 Anxiety disorder, unspecified: Secondary | ICD-10-CM | POA: Diagnosis not present

## 2018-01-07 DIAGNOSIS — K219 Gastro-esophageal reflux disease without esophagitis: Secondary | ICD-10-CM | POA: Insufficient documentation

## 2018-01-07 DIAGNOSIS — F17210 Nicotine dependence, cigarettes, uncomplicated: Secondary | ICD-10-CM | POA: Diagnosis not present

## 2018-01-07 DIAGNOSIS — Z79899 Other long term (current) drug therapy: Secondary | ICD-10-CM | POA: Diagnosis not present

## 2018-01-07 DIAGNOSIS — G5603 Carpal tunnel syndrome, bilateral upper limbs: Secondary | ICD-10-CM | POA: Insufficient documentation

## 2018-01-07 DIAGNOSIS — I10 Essential (primary) hypertension: Secondary | ICD-10-CM | POA: Diagnosis not present

## 2018-01-07 DIAGNOSIS — F329 Major depressive disorder, single episode, unspecified: Secondary | ICD-10-CM | POA: Insufficient documentation

## 2018-01-07 DIAGNOSIS — G5601 Carpal tunnel syndrome, right upper limb: Secondary | ICD-10-CM | POA: Diagnosis not present

## 2018-01-07 HISTORY — PX: CARPAL TUNNEL RELEASE: SHX101

## 2018-01-07 SURGERY — CARPAL TUNNEL RELEASE
Anesthesia: Regional | Laterality: Right

## 2018-01-07 MED ORDER — CEFAZOLIN SODIUM-DEXTROSE 2-4 GM/100ML-% IV SOLN
INTRAVENOUS | Status: AC
  Filled 2018-01-07: qty 100

## 2018-01-07 MED ORDER — DEXAMETHASONE SODIUM PHOSPHATE 10 MG/ML IJ SOLN
INTRAMUSCULAR | Status: DC | PRN
  Administered 2018-01-07: 10 mg via INTRAVENOUS

## 2018-01-07 MED ORDER — ONDANSETRON HCL 4 MG/2ML IJ SOLN
INTRAMUSCULAR | Status: DC | PRN
  Administered 2018-01-07: 4 mg via INTRAVENOUS

## 2018-01-07 MED ORDER — PROPOFOL 10 MG/ML IV BOLUS
INTRAVENOUS | Status: DC | PRN
  Administered 2018-01-07: 150 mg via INTRAVENOUS
  Administered 2018-01-07 (×2): 20 mg via INTRAVENOUS

## 2018-01-07 MED ORDER — SCOPOLAMINE 1 MG/3DAYS TD PT72: 1.0000 | MEDICATED_PATCH | Freq: Once | TRANSDERMAL | Status: DC | PRN

## 2018-01-07 MED ORDER — LIDOCAINE HCL (PF) 0.5 % IJ SOLN
INTRAMUSCULAR | Status: DC | PRN
  Administered 2018-01-07: 35 mL via INTRAVENOUS

## 2018-01-07 MED ORDER — CHLORHEXIDINE GLUCONATE 4 % EX LIQD: 60.0000 mL | Freq: Once | CUTANEOUS | Status: DC

## 2018-01-07 MED ORDER — HYDROCODONE-ACETAMINOPHEN 5-325 MG PO TABS: 1.0000 | ORAL_TABLET | Freq: Four times a day (QID) | ORAL | 0 refills | Status: DC | PRN

## 2018-01-07 MED ORDER — OXYCODONE HCL 5 MG PO TABS: 5.0000 mg | ORAL_TABLET | Freq: Once | ORAL | Status: DC | PRN

## 2018-01-07 MED ORDER — LACTATED RINGERS IV SOLN
INTRAVENOUS | Status: DC
  Administered 2018-01-07: 14:00:00 via INTRAVENOUS

## 2018-01-07 MED ORDER — OXYCODONE HCL 5 MG/5ML PO SOLN: 5.0000 mg | Freq: Once | ORAL | Status: DC | PRN

## 2018-01-07 MED ORDER — LIDOCAINE HCL (CARDIAC) PF 100 MG/5ML IV SOSY
PREFILLED_SYRINGE | INTRAVENOUS | Status: DC | PRN
  Administered 2018-01-07: 40 mg via INTRAVENOUS

## 2018-01-07 MED ORDER — PROMETHAZINE HCL 25 MG/ML IJ SOLN: 6.2500 mg | INTRAMUSCULAR | Status: DC | PRN

## 2018-01-07 MED ORDER — CEFAZOLIN SODIUM-DEXTROSE 2-4 GM/100ML-% IV SOLN
2.0000 g | INTRAVENOUS | Status: AC
  Administered 2018-01-07: 2 g via INTRAVENOUS

## 2018-01-07 MED ORDER — MIDAZOLAM HCL 2 MG/2ML IJ SOLN
1.0000 mg | INTRAMUSCULAR | Status: DC | PRN
  Administered 2018-01-07: 2 mg via INTRAVENOUS

## 2018-01-07 MED ORDER — DEXAMETHASONE SODIUM PHOSPHATE 10 MG/ML IJ SOLN
INTRAMUSCULAR | Status: AC
  Filled 2018-01-07: qty 1

## 2018-01-07 MED ORDER — MIDAZOLAM HCL 2 MG/2ML IJ SOLN
INTRAMUSCULAR | Status: AC
  Filled 2018-01-07: qty 2

## 2018-01-07 MED ORDER — FENTANYL CITRATE (PF) 100 MCG/2ML IJ SOLN
50.0000 ug | INTRAMUSCULAR | Status: DC | PRN
  Administered 2018-01-07: 100 ug via INTRAVENOUS

## 2018-01-07 MED ORDER — ONDANSETRON HCL 4 MG/2ML IJ SOLN
INTRAMUSCULAR | Status: AC
  Filled 2018-01-07: qty 2

## 2018-01-07 MED ORDER — FENTANYL CITRATE (PF) 100 MCG/2ML IJ SOLN
INTRAMUSCULAR | Status: AC
  Filled 2018-01-07: qty 2

## 2018-01-07 MED ORDER — BUPIVACAINE HCL (PF) 0.25 % IJ SOLN
INTRAMUSCULAR | Status: DC | PRN
  Administered 2018-01-07: 9 mL

## 2018-01-07 MED ORDER — FENTANYL CITRATE (PF) 100 MCG/2ML IJ SOLN: 25.0000 ug | INTRAMUSCULAR | Status: DC | PRN

## 2018-01-07 MED ORDER — LIDOCAINE 2% (20 MG/ML) 5 ML SYRINGE
INTRAMUSCULAR | Status: AC
  Filled 2018-01-07: qty 5

## 2018-01-07 SURGICAL SUPPLY — 32 items
BLADE SURG 15 STRL LF DISP TIS (BLADE) ×1 IMPLANT
BLADE SURG 15 STRL SS (BLADE) ×1
BNDG COHESIVE 3X5 TAN STRL LF (GAUZE/BANDAGES/DRESSINGS) ×2 IMPLANT
BNDG ESMARK 4X9 LF (GAUZE/BANDAGES/DRESSINGS) IMPLANT
BNDG GAUZE ELAST 4 BULKY (GAUZE/BANDAGES/DRESSINGS) ×2 IMPLANT
CHLORAPREP W/TINT 26ML (MISCELLANEOUS) ×2 IMPLANT
CORD BIPOLAR FORCEPS 12FT (ELECTRODE) ×2 IMPLANT
COVER BACK TABLE 60X90IN (DRAPES) ×2 IMPLANT
COVER MAYO STAND STRL (DRAPES) ×2 IMPLANT
CUFF TOURNIQUET SINGLE 18IN (TOURNIQUET CUFF) ×2 IMPLANT
DRAPE EXTREMITY T 121X128X90 (DRAPE) ×2 IMPLANT
DRAPE SURG 17X23 STRL (DRAPES) ×2 IMPLANT
DRSG PAD ABDOMINAL 8X10 ST (GAUZE/BANDAGES/DRESSINGS) ×2 IMPLANT
GAUZE SPONGE 4X4 12PLY STRL (GAUZE/BANDAGES/DRESSINGS) ×2 IMPLANT
GAUZE SPONGE 4X4 12PLY STRL LF (GAUZE/BANDAGES/DRESSINGS) ×2 IMPLANT
GAUZE XEROFORM 1X8 LF (GAUZE/BANDAGES/DRESSINGS) ×2 IMPLANT
GLOVE BIOGEL PI IND STRL 8.5 (GLOVE) ×1 IMPLANT
GLOVE BIOGEL PI INDICATOR 8.5 (GLOVE) ×1
GLOVE SURG ORTHO 8.0 STRL STRW (GLOVE) ×2 IMPLANT
GOWN STRL REUS W/ TWL LRG LVL3 (GOWN DISPOSABLE) ×1 IMPLANT
GOWN STRL REUS W/TWL LRG LVL3 (GOWN DISPOSABLE) ×1
GOWN STRL REUS W/TWL XL LVL3 (GOWN DISPOSABLE) ×2 IMPLANT
NEEDLE PRECISIONGLIDE 27X1.5 (NEEDLE) IMPLANT
NS IRRIG 1000ML POUR BTL (IV SOLUTION) ×2 IMPLANT
PACK BASIN DAY SURGERY FS (CUSTOM PROCEDURE TRAY) ×2 IMPLANT
STOCKINETTE 4X48 STRL (DRAPES) ×2 IMPLANT
SUT ETHILON 4 0 PS 2 18 (SUTURE) ×2 IMPLANT
SUT VICRYL 4-0 PS2 18IN ABS (SUTURE) IMPLANT
SYR BULB 3OZ (MISCELLANEOUS) ×2 IMPLANT
SYR CONTROL 10ML LL (SYRINGE) IMPLANT
TOWEL GREEN STERILE FF (TOWEL DISPOSABLE) ×2 IMPLANT
UNDERPAD 30X30 (UNDERPADS AND DIAPERS) ×2 IMPLANT

## 2018-01-07 NOTE — Discharge Instructions (Addendum)

## 2018-01-07 NOTE — H&P (Signed)
  Heather Davidson is an 57 y.o. female.   Chief Complaint:numbness right handHPI:Heather Davidson is a 57yo female with bilateral carpal tunnel syndrome. She was last seen on 07/02/2017. She has had each injected with good relief of symptoms. Does have worn off and she is complaining of numbness and tingling returning and she is dropping things. She has moderate pain especially at night waking her up. He has had conservative treatment which has failed. Does have symptoms in her neck and pain going down her arms. Is a history of arthritis cervical spine. Nerve conductions are    Past Medical History:  Diagnosis Date  . Anxiety   . Clostridium difficile colitis 12/2010  . Depression   . GERD (gastroesophageal reflux disease)   . HTN (hypertension)    physician stopped BP med  . Seasonal allergies     Past Surgical History:  Procedure Laterality Date  . ABDOMINAL HYSTERECTOMY    . CERVICAL CONIZATION W/BX     for precervical cancer in remote past  . CESAREAN SECTION    . COLONOSCOPY  09/2007   anal papilla, normal colon and terminal ileum  . complete hysterectomy    . ESOPHAGOGASTRODUODENOSCOPY  09/2007   solitary inlet patch (bx neg), antral erosions  . TUBAL LIGATION      Family History  Problem Relation Age of Onset  . Lung cancer Father        asbestosis  . COPD Mother        Died 24  . Colon cancer Neg Hx    Social History:  reports that she has been smoking cigarettes. She has a 25.00 pack-year smoking history. She has never used smokeless tobacco. She reports that she drinks alcohol. She reports that she does not use drugs.  Allergies:  Allergies  Allergen Reactions  . Ciprofloxacin Nausea And Vomiting    No medications prior to admission.    No results found for this or any previous visit (from the past 48 hour(s)).  No results found.   Pertinent items are noted in HPI.  Height 5\' 6"  (1.676 m), weight 75.8 kg.  General appearance: alert, cooperative and appears  stated age Head: Normocephalic, without obvious abnormality Neck: no carotid bruit and no JVD Resp: clear to auscultation bilaterally Cardio: regular rate and rhythm, S1, S2 normal, no murmur, click, rub or gallop GI: soft, non-tender; bowel sounds normal; no masses,  no organomegaly Extremities: numbness right hand Pulses: 2+ and symmetric Skin: Skin color, texture, turgor normal. No rashes or lesions Neurologic: Grossly normal Incision/Wound: na  Assessment/Plan Assessment:  1. Bilateral carpal tunnel syndrome  2. Cervicalgia    Plan: Would like to proceed with right carpal tunnel release. Pre-peri-postoperative course are discussed along with risks and complications. She is aware there is no guarantee to the surgery the possibility of infection recurrence injury to arteries nerves tendons complete relief symptoms and dystrophy. She is scheduled for right carpal tunnel release in outpatient under regional anesthesia. Questions are encouraged and answered to her satisfaction.   Heather Davidson R 01/07/2018, 1:14 PM

## 2018-01-07 NOTE — Anesthesia Procedure Notes (Signed)
Procedure Name: LMA Insertion Performed by: Erlene Devita W, CRNA Pre-anesthesia Checklist: Patient identified, Emergency Drugs available, Suction available and Patient being monitored Patient Re-evaluated:Patient Re-evaluated prior to induction Oxygen Delivery Method: Circle system utilized Preoxygenation: Pre-oxygenation with 100% oxygen Induction Type: IV induction Ventilation: Mask ventilation without difficulty LMA: LMA inserted LMA Size: 4.0 Number of attempts: 1 Placement Confirmation: positive ETCO2 Tube secured with: Tape Dental Injury: Teeth and Oropharynx as per pre-operative assessment        

## 2018-01-07 NOTE — Anesthesia Procedure Notes (Signed)
Anesthesia Regional Block: Bier block (IV Regional)   Pre-Anesthetic Checklist: ,, timeout performed, Correct Patient, Correct Site, Correct Laterality, Correct Procedure,, site marked, surgical consent,, at surgeon's request  Laterality: Right and Upper     Needles:  Injection technique: Single-shot  Needle Type: Other      Needle Gauge: 20     Additional Needles:   Procedures:,,,,, intact distal pulses, Esmarch exsanguination, single tourniquet utilized,  Narrative:   Performed by: [employer]

## 2018-01-07 NOTE — Brief Op Note (Signed)
01/07/2018  3:08 PM  PATIENT:  Heather Davidson  57 y.o. female  PRE-OPERATIVE DIAGNOSIS:  RIGHT CARPAL TUNNEL SYNDROME  POST-OPERATIVE DIAGNOSIS:  RIGHT CARPAL TUNNEL SYNDROME  PROCEDURE:  Procedure(s) with comments: RIGHT CARPAL TUNNEL RELEASE (Right) - FAB  SURGEON:  Surgeon(s) and Role:    * Cindee Salt, MD - Primary  PHYSICIAN ASSISTANT:   ASSISTANTS: none   ANESTHESIA:   local, regional and IV sedation  EBL:  2 mL   BLOOD ADMINISTERED:none  DRAINS: none   LOCAL MEDICATIONS USED:  BUPIVICAINE   SPECIMEN:  No Specimen  DISPOSITION OF SPECIMEN:  N/A  COUNTS:  YES  TOURNIQUET:   Total Tourniquet Time Documented: Forearm (Right) - 23 minutes Total: Forearm (Right) - 23 minutes   DICTATION: .Reubin Milan Dictation  PLAN OF CARE: Discharge to home after PACU  PATIENT DISPOSITION:  PACU - hemodynamically stable.

## 2018-01-07 NOTE — Op Note (Signed)
NAME: Heather Davidson Latin MEDICAL RECORD NO: [id number] DATE OF BIRTH: Feb 26, 1961 FACILITY: [facility] LOCATION: [facility] PHYSICIAN: Nicki Reaper, MD   OPERATIVE REPORT   DATE OF PROCEDURE: 01/07/18    PREOPERATIVE DIAGNOSIS:   Tunnel syndrome right   POSTOPERATIVE DIAGNOSIS: Same  PROCEDURE:  Decompression median nerve right wrist   SURGEON: Cindee Salt, M.D.   ASSISTANT: none   ANESTHESIA:  General with regional and Local   INTRAVENOUS FLUIDS:  Per anesthesia flow sheet.   ESTIMATED BLOOD LOSS:  Minimal.   COMPLICATIONS:  None.   SPECIMENS:  none   TOURNIQUET TIME:    Total Tourniquet Time Documented: Forearm (Right) - 23 minutes Total: Forearm (Right) - 23 minutes    DISPOSITION:  Stable to PACU.   INDICATIONS: Patient is a 57 year old female with a history of numbness and tingling both hands.  Nerve conductions are positive.  This is not responded to conservative treatment.  She has elected to undergo surgical decompression of the median nerve right hand.  Pre-peri-and postoperative course been discussed along with risks and complications.  She is aware there is no guarantee to the surgery the possibility of infection recurrence injury to arteries nerves tendons complete relief symptoms dystrophy.  Preoperative area the patient seen extremity marked by both patient and surgeon antibiotic given  OPERATIVE COURSE: Patient brought to the operating room where a forearm-based IV regional anesthetic was carried out without difficulty.  She was prepped using ChloraPrep in supine position with the right arm free.  A three-minute dry time was allowed timeout taken to confirm patient procedure.  After adequate anesthesia appeared to be afforded a an incision was made she had feeling a general anesthetic was then given by LMA.  The longitudinal incision made made in the palm was continued.  This was deepened with bleeders being electrocauterized with bipolar.  The palmar  fascia was split.  The superficial palmar arch was identified.  The flexor tendon of the ring little finger was identified.  Retractors were placed retracted median nerve radially and the ulnar nerve ulnarly.  The flexor retinaculum was then released on its ulnar border with sharp dissection.  A right angle and stool retractor placed between skin and forearm fascia deep structures were dissected free and the proximal aspect of the flexor retinaculum distal forearm fascia was then released with sharp dissection and blunt scissors.  Done under direct vision.  The canal was explored.  There were 2 motor branches both entering into the ligament.  Both were intact.  The area compression of the nerve was apparent.  No further lesions were identified.  The wound was copiously irrigated with saline.  The skin was closed with interrupted 4 nylon sutures.  Local infiltration with quarter percent bupivacaine without epinephrine was given.  29 cc was used.  A sterile compressive dressing with the fingers free was applied.  Inflation of the tourniquet all fingers immediately pink.  She was taken to the recovery room for observation in satisfactory condition.  She will be discharged home to return [facility] [place] in 1 week on Tylenol with ibuprofen with Norco as a breakthrough.   Nicki Reaper, MD Electronically signed, 01/07/18

## 2018-01-07 NOTE — Transfer of Care (Signed)
Immediate Anesthesia Transfer of Care Note  Patient: Heather Davidson  Procedure(s) Performed: RIGHT CARPAL TUNNEL RELEASE (Right )  Patient Location: PACU  Anesthesia Type:General and Bier block  Level of Consciousness: awake and alert   Airway & Oxygen Therapy: Patient Spontanous Breathing and Patient connected to face mask oxygen  Post-op Assessment: Report given to RN and Post -op Vital signs reviewed and stable  Post vital signs: Reviewed and stable  Last Vitals:  Vitals Value Taken Time  BP    Temp    Pulse    Resp    SpO2      Last Pain:  Vitals:   01/07/18 1343  TempSrc: Oral  PainSc: 3       Patients Stated Pain Goal: 3 (01/07/18 1343)  Complications: No apparent anesthesia complications

## 2018-01-07 NOTE — Anesthesia Preprocedure Evaluation (Addendum)
Anesthesia Evaluation  Patient identified by MRN, date of birth, ID band Patient awake    Reviewed: Allergy & Precautions, NPO status , Patient's Chart, lab work & pertinent test results  History of Anesthesia Complications Negative for: history of anesthetic complications  Airway Mallampati: II  TM Distance: >3 FB Neck ROM: Full    Dental  (+) Dental Advisory Given, Teeth Intact   Pulmonary Current Smoker,    breath sounds clear to auscultation       Cardiovascular hypertension, Pt. on medications  Rhythm:Regular Rate:Normal     Neuro/Psych PSYCHIATRIC DISORDERS Anxiety Depression negative neurological ROS     GI/Hepatic Neg liver ROS, GERD  Medicated and Controlled,  Endo/Other  negative endocrine ROS  Renal/GU negative Renal ROS  negative genitourinary   Musculoskeletal negative musculoskeletal ROS (+)   Abdominal   Peds  Hematology negative hematology ROS (+)   Anesthesia Other Findings   Reproductive/Obstetrics                            Anesthesia Physical Anesthesia Plan  ASA: II  Anesthesia Plan: Bier Block and Bier Block-LIDOCAINE ONLY   Post-op Pain Management:    Induction: Intravenous  PONV Risk Score and Plan: 2 and Propofol infusion and Treatment may vary due to age or medical condition  Airway Management Planned: Natural Airway and Simple Face Mask  Additional Equipment: None  Intra-op Plan:   Post-operative Plan:   Informed Consent: I have reviewed the patients History and Physical, chart, labs and discussed the procedure including the risks, benefits and alternatives for the proposed anesthesia with the patient or authorized representative who has indicated his/her understanding and acceptance.     Plan Discussed with: CRNA and Anesthesiologist  Anesthesia Plan Comments:        Anesthesia Quick Evaluation

## 2018-01-07 NOTE — Anesthesia Postprocedure Evaluation (Signed)
Anesthesia Post Note  Patient: Heather Davidson  Procedure(s) Performed: RIGHT CARPAL TUNNEL RELEASE (Right )     Patient location during evaluation: PACU Anesthesia Type: Bier Block and General Level of consciousness: awake and alert Pain management: pain level controlled Vital Signs Assessment: post-procedure vital signs reviewed and stable Respiratory status: spontaneous breathing, nonlabored ventilation and respiratory function stable Cardiovascular status: blood pressure returned to baseline and stable Postop Assessment: no apparent nausea or vomiting Anesthetic complications: no Comments: Bier block was ineffective for surgical anesthesia, converted to GA with LMA    Last Vitals:  Vitals:   01/07/18 1530 01/07/18 1600  BP: (!) 151/76 (!) 150/82  Pulse: 75 75  Resp: 10 18  Temp:  36.5 C  SpO2: 98% 100%    Last Pain:  Vitals:   01/07/18 1600  TempSrc:   PainSc: 0-No pain                 Beryle Lathe

## 2018-01-10 ENCOUNTER — Encounter (HOSPITAL_BASED_OUTPATIENT_CLINIC_OR_DEPARTMENT_OTHER): Payer: Self-pay | Admitting: Orthopedic Surgery

## 2018-01-24 DIAGNOSIS — E785 Hyperlipidemia, unspecified: Secondary | ICD-10-CM | POA: Diagnosis not present

## 2018-01-24 DIAGNOSIS — Z79899 Other long term (current) drug therapy: Secondary | ICD-10-CM | POA: Diagnosis not present

## 2018-01-31 DIAGNOSIS — J439 Emphysema, unspecified: Secondary | ICD-10-CM | POA: Diagnosis not present

## 2018-01-31 DIAGNOSIS — Z23 Encounter for immunization: Secondary | ICD-10-CM | POA: Diagnosis not present

## 2018-01-31 DIAGNOSIS — E785 Hyperlipidemia, unspecified: Secondary | ICD-10-CM | POA: Diagnosis not present

## 2018-02-02 NOTE — Patient Instructions (Signed)
Heather Davidson  02/02/2018     @PREFPERIOPPHARMACY @   Your procedure is scheduled on  02/10/2018  Report to [facility] at  930   A.M.  Call this number if you have problems the morning of surgery:  [phone]   Remember:  Do not eat or drink after midnight.  You may drink clear liquids until  (follow the instructions given to you) .  Clear liquids allowed are:                    Water, Juice (non-citric and without pulp), Carbonated beverages, Clear Tea, Black Coffee only, Plain Jell-O only, Gatorade and Plain Popsicles only    Take these medicines the morning of surgery with A SIP OF WATER  Hydrocodone ( if needed), xyzal, losartan, zantac.    Do not wear jewelry, make-up or nail polish.  Do not wear lotions, powders, or perfumes, or deodorant.  Do not shave 48 hours prior to surgery.  Men may shave face and neck.  Do not bring valuables to the hospital.  [facility] is not responsible for any belongings or valuables.  Contacts, dentures or bridgework may not be worn into surgery.  Leave your suitcase in the car.  After surgery it may be brought to your room.  For patients admitted to the hospital, discharge time will be determined by your treatment team.  Patients discharged the day of surgery will not be allowed to drive home.   Name and phone number of your driver:   family Special instructions:  Follow the diet and prep instructions given to you by Dr Luvenia Starch office.  Please read over the following fact sheets that you were given. Anesthesia Post-op Instructions and Care and Recovery After Surgery       Colonoscopy, Adult A colonoscopy is an exam to look at the large intestine. It is done to check for problems, such as:  Lumps (tumors).  Growths (polyps).  Swelling (inflammation).  Bleeding.  What happens before the procedure? Eating and drinking Follow instructions from your doctor about eating and drinking. These instructions may  include:  A few days before the procedure - follow a low-fiber diet. ? Avoid nuts. ? Avoid seeds. ? Avoid dried fruit. ? Avoid raw fruits. ? Avoid vegetables.  1-3 days before the procedure - follow a clear liquid diet. Avoid liquids that have red or purple dye. Drink only clear liquids, such as: ? Clear broth or bouillon. ? Black coffee or tea. ? Clear juice. ? Clear soft drinks or sports drinks. ? Gelatin dessert. ? Popsicles.  On the day of the procedure - do not eat or drink anything during the 2 hours before the procedure.  Bowel prep If you were prescribed an oral bowel prep:  Take it as told by your doctor. Starting the day before your procedure, you will need to drink a lot of liquid. The liquid will cause you to poop (have bowel movements) until your poop is almost clear or light green.  If your skin or butt gets irritated from diarrhea, you may: ? Wipe the area with wipes that have medicine in them, such as adult wet wipes with aloe and vitamin E. ? Put something on your skin that soothes the area, such as petroleum jelly.  If you throw up (vomit) while drinking the bowel prep, take a break for up to 60 minutes. Then begin the bowel prep again. If  you keep throwing up and you cannot take the bowel prep without throwing up, call your doctor.  General instructions  Ask your doctor about changing or stopping your normal medicines. This is important if you take diabetes medicines or blood thinners.  Plan to have someone take you home from the hospital or clinic. What happens during the procedure?  An IV tube may be put into one of your veins.  You will be given medicine to help you relax (sedative).  To reduce your risk of infection: ? Your doctors will wash their hands. ? Your anal area will be washed with soap.  You will be asked to lie on your side with your knees bent.  Your doctor will get a long, thin, flexible tube ready. The tube will have a camera and a  light on the end.  The tube will be put into your anus.  The tube will be gently put into your large intestine.  Air will be delivered into your large intestine to keep it open. You may feel some pressure or cramping.  The camera will be used to take photos.  A small tissue sample may be removed from your body to be looked at under a microscope (biopsy). If any possible problems are found, the tissue will be sent to a lab for testing.  If small growths are found, your doctor may remove them and have them checked for cancer.  The tube that was put into your anus will be slowly removed. The procedure may vary among doctors and hospitals. What happens after the procedure?  Your doctor will check on you often until the medicines you were given have worn off.  Do not drive for 24 hours after the procedure.  You may have a small amount of blood in your poop.  You may pass gas.  You may have mild cramps or bloating in your belly (abdomen).  It is up to you to get the results of your procedure. Ask your doctor, or the department performing the procedure, when your results will be ready. This information is not intended to replace advice given to you by your health care provider. Make sure you discuss any questions you have with your health care provider. Document Released: 05/23/2010 Document Revised: 02/19/2016 Document Reviewed: 07/02/2015 Elsevier Interactive Patient Education  2017 [employer]  Colonoscopy, Adult, Care After This sheet gives you information about how to care for yourself after your procedure. Your health care provider may also give you more specific instructions. If you have problems or questions, contact your health care provider. What can I expect after the procedure? After the procedure, it is common to have:  A small amount of blood in your stool for 24 hours after the procedure.  Some gas.  Mild abdominal cramping or bloating.  Follow these  instructions at home: General instructions   For the first 24 hours after the procedure: ? Do not drive or use machinery. ? Do not sign important documents. ? Do not drink alcohol. ? Do your regular daily activities at a slower pace than normal. ? Eat soft, easy-to-digest foods. ? Rest often.  Take over-the-counter or prescription medicines only as told by your health care provider.  It is up to you to get the results of your procedure. Ask your health care provider, or the department performing the procedure, when your results will be ready. Relieving cramping and bloating  Try walking around when you have cramps or feel bloated.  Apply heat  to your abdomen as told by your health care provider. Use a heat source that your health care provider recommends, such as a moist heat pack or a heating pad. ? Place a towel between your skin and the heat source. ? Leave the heat on for 20-30 minutes. ? Remove the heat if your skin turns bright red. This is especially important if you are unable to feel pain, heat, or cold. You may have a greater risk of getting burned. Eating and drinking  Drink enough fluid to keep your urine clear or pale yellow.  Resume your normal diet as instructed by your health care provider. Avoid heavy or fried foods that are hard to digest.  Avoid drinking alcohol for as long as instructed by your health care provider. Contact a health care provider if:  You have blood in your stool 2-3 days after the procedure. Get help right away if:  You have more than a small spotting of blood in your stool.  You pass large blood clots in your stool.  Your abdomen is swollen.  You have nausea or vomiting.  You have a fever.  You have increasing abdominal pain that is not relieved with medicine. This information is not intended to replace advice given to you by your health care provider. Make sure you discuss any questions you have with your health care  provider. Document Released: 12/03/2003 Document Revised: 01/13/2016 Document Reviewed: 07/02/2015 Elsevier Interactive Patient Education  2018 [place] Anesthesia is a term that refers to techniques, procedures, and medicines that help a person stay safe and comfortable during a medical procedure. Monitored anesthesia care, or sedation, is one type of anesthesia. Your anesthesia specialist may recommend sedation if you will be having a procedure that does not require you to be unconscious, such as:  Cataract surgery.  A dental procedure.  A biopsy.  A colonoscopy.  During the procedure, you may receive a medicine to help you relax (sedative). There are three levels of sedation:  Mild sedation. At this level, you may feel awake and relaxed. You will be able to follow directions.  Moderate sedation. At this level, you will be sleepy. You may not remember the procedure.  Deep sedation. At this level, you will be asleep. You will not remember the procedure.  The more medicine you are given, the deeper your level of sedation will be. Depending on how you respond to the procedure, the anesthesia specialist may change your level of sedation or the type of anesthesia to fit your needs. An anesthesia specialist will monitor you closely during the procedure. Let your health care provider know about:  Any allergies you have.  All medicines you are taking, including vitamins, herbs, eye drops, creams, and over-the-counter medicines.  Any use of steroids (by mouth or as a cream).  Any problems you or family members have had with sedatives and anesthetic medicines.  Any blood disorders you have.  Any surgeries you have had.  Any medical conditions you have, such as sleep apnea.  Whether you are pregnant or may be pregnant.  Any use of cigarettes, alcohol, or street drugs. What are the risks? Generally, this is a safe procedure. However, problems may  occur, including:  Getting too much medicine (oversedation).  Nausea.  Allergic reaction to medicines.  Trouble breathing. If this happens, a breathing tube may be used to help with breathing. It will be removed when you are awake and breathing on your own.  Heart  trouble.  Lung trouble.  Before the procedure Staying hydrated Follow instructions from your health care provider about hydration, which may include:  Up to 2 hours before the procedure - you may continue to drink clear liquids, such as water, clear fruit juice, black coffee, and plain tea.  Eating and drinking restrictions Follow instructions from your health care provider about eating and drinking, which may include:  8 hours before the procedure - stop eating heavy meals or foods such as meat, fried foods, or fatty foods.  6 hours before the procedure - stop eating light meals or foods, such as toast or cereal.  6 hours before the procedure - stop drinking milk or drinks that contain milk.  2 hours before the procedure - stop drinking clear liquids.  Medicines Ask your health care provider about:  Changing or stopping your regular medicines. This is especially important if you are taking diabetes medicines or blood thinners.  Taking medicines such as aspirin and ibuprofen. These medicines can thin your blood. Do not take these medicines before your procedure if your health care provider instructs you not to.  Tests and exams  You will have a physical exam.  You may have blood tests done to show: ? How well your kidneys and liver are working. ? How well your blood can clot.  General instructions  Plan to have someone take you home from the hospital or clinic.  If you will be going home right after the procedure, plan to have someone with you for 24 hours.  What happens during the procedure?  Your blood pressure, heart rate, breathing, level of pain and overall condition will be monitored.  An IV  tube will be inserted into one of your veins.  Your anesthesia specialist will give you medicines as needed to keep you comfortable during the procedure. This may mean changing the level of sedation.  The procedure will be performed. After the procedure  Your blood pressure, heart rate, breathing rate, and blood oxygen level will be monitored until the medicines you were given have worn off.  Do not drive for 24 hours if you received a sedative.  You may: ? Feel sleepy, clumsy, or nauseous. ? Feel forgetful about what happened after the procedure. ? Have a sore throat if you had a breathing tube during the procedure. ? Vomit. This information is not intended to replace advice given to you by your health care provider. Make sure you discuss any questions you have with your health care provider. Document Released: 01/14/2005 Document Revised: 09/27/2015 Document Reviewed: 08/11/2015 Elsevier Interactive Patient Education  2018 [place], Care After These instructions provide you with information about caring for yourself after your procedure. Your health care provider may also give you more specific instructions. Your treatment has been planned according to current medical practices, but problems sometimes occur. Call your health care provider if you have any problems or questions after your procedure. What can I expect after the procedure? After your procedure, it is common to:  Feel sleepy for several hours.  Feel clumsy and have poor balance for several hours.  Feel forgetful about what happened after the procedure.  Have poor judgment for several hours.  Feel nauseous or vomit.  Have a sore throat if you had a breathing tube during the procedure.  Follow these instructions at home: For at least 24 hours after the procedure:   Do not: ? Participate in activities in which you could fall or  become injured. ? Drive. ? Use heavy  machinery. ? Drink alcohol. ? Take sleeping pills or medicines that cause drowsiness. ? Make important decisions or sign legal documents. ? Take care of children on your own.  Rest. Eating and drinking  Follow the diet that is recommended by your health care provider.  If you vomit, drink water, juice, or soup when you can drink without vomiting.  Make sure you have little or no nausea before eating solid foods. General instructions  Have a responsible adult stay with you until you are awake and alert.  Take over-the-counter and prescription medicines only as told by your health care provider.  If you smoke, do not smoke without supervision.  Keep all follow-up visits as told by your health care provider. This is important. Contact a health care provider if:  You keep feeling nauseous or you keep vomiting.  You feel light-headed.  You develop a rash.  You have a fever. Get help right away if:  You have trouble breathing. This information is not intended to replace advice given to you by your health care provider. Make sure you discuss any questions you have with your health care provider. Document Released: 08/11/2015 Document Revised: 12/11/2015 Document Reviewed: 08/11/2015 Elsevier Interactive Patient Education  [employer].

## 2018-02-07 ENCOUNTER — Other Ambulatory Visit: Payer: Self-pay

## 2018-02-07 ENCOUNTER — Telehealth: Payer: Self-pay | Admitting: Internal Medicine

## 2018-02-07 ENCOUNTER — Encounter (HOSPITAL_COMMUNITY)
Admission: RE | Admit: 2018-02-07 | Discharge: 2018-02-07 | Disposition: A | Discharge status: Home / Self Care | Payer: BLUE CROSS/BLUE SHIELD | Source: Ambulatory Visit | Attending: Internal Medicine | Admitting: Internal Medicine

## 2018-02-07 DIAGNOSIS — Z01812 Encounter for preprocedural laboratory examination: Secondary | ICD-10-CM | POA: Insufficient documentation

## 2018-02-07 LAB — BASIC METABOLIC PANEL
Anion gap: 9 (ref 5–15)
BUN: 14 mg/dL (ref 6–20)
CO2: 26 mmol/L (ref 22–32)
Calcium: 9.3 mg/dL (ref 8.9–10.3)
Chloride: 102 mmol/L (ref 98–111)
Creatinine, Ser: 0.73 mg/dL (ref 0.44–1.00)
GFR calc Af Amer: >60 mL/min (ref >60)
GFR calc non Af Amer: >60 mL/min (ref >60)
Glucose, Bld: 85 mg/dL (ref 70–99)
Potassium: 4.4 mmol/L (ref 3.5–5.1)
Sodium: 137 mmol/L (ref 135–145)

## 2018-02-07 MED ORDER — PEG 3350-KCL-NA BICARB-NACL 420 G PO SOLR: 4000.0000 mL | ORAL | 0 refills | Status: DC

## 2018-02-07 NOTE — Addendum Note (Signed)
Addended by: Corrie Mckusick on: 02/07/2018 01:48 PM   Modules accepted: Orders

## 2018-02-07 NOTE — Telephone Encounter (Signed)
Rx for Tri-Lyte re-sent to pharmacy. Called and informed pt.

## 2018-02-07 NOTE — Telephone Encounter (Signed)
Routing to Martina 

## 2018-02-07 NOTE — Telephone Encounter (Signed)
[facility] DOES NOT HAVE THE PRESCRIPTION FOR PATIENT PREP.  PLEASE SEND

## 2018-02-10 ENCOUNTER — Ambulatory Visit (HOSPITAL_COMMUNITY): Payer: BLUE CROSS/BLUE SHIELD | Admitting: Anesthesiology

## 2018-02-10 ENCOUNTER — Encounter (HOSPITAL_COMMUNITY): Payer: Self-pay | Admitting: *Deleted

## 2018-02-10 ENCOUNTER — Ambulatory Visit (HOSPITAL_COMMUNITY)
Admission: RE | Admit: 2018-02-10 | Discharge: 2018-02-10 | Disposition: A | Discharge status: Home / Self Care | Payer: BLUE CROSS/BLUE SHIELD | Source: Ambulatory Visit | Attending: Internal Medicine | Admitting: Internal Medicine

## 2018-02-10 ENCOUNTER — Encounter (HOSPITAL_COMMUNITY)
Admission: RE | Disposition: A | Discharge status: Home / Self Care | Payer: Self-pay | Source: Ambulatory Visit | Attending: Internal Medicine

## 2018-02-10 DIAGNOSIS — K219 Gastro-esophageal reflux disease without esophagitis: Secondary | ICD-10-CM | POA: Diagnosis not present

## 2018-02-10 DIAGNOSIS — F329 Major depressive disorder, single episode, unspecified: Secondary | ICD-10-CM | POA: Diagnosis not present

## 2018-02-10 DIAGNOSIS — F17210 Nicotine dependence, cigarettes, uncomplicated: Secondary | ICD-10-CM | POA: Diagnosis not present

## 2018-02-10 DIAGNOSIS — Z79899 Other long term (current) drug therapy: Secondary | ICD-10-CM | POA: Diagnosis not present

## 2018-02-10 DIAGNOSIS — K640 First degree hemorrhoids: Secondary | ICD-10-CM | POA: Diagnosis not present

## 2018-02-10 DIAGNOSIS — I10 Essential (primary) hypertension: Secondary | ICD-10-CM | POA: Insufficient documentation

## 2018-02-10 DIAGNOSIS — F419 Anxiety disorder, unspecified: Secondary | ICD-10-CM | POA: Diagnosis not present

## 2018-02-10 DIAGNOSIS — Z1211 Encounter for screening for malignant neoplasm of colon: Secondary | ICD-10-CM | POA: Insufficient documentation

## 2018-02-10 DIAGNOSIS — Z7982 Long term (current) use of aspirin: Secondary | ICD-10-CM | POA: Insufficient documentation

## 2018-02-10 HISTORY — PX: COLONOSCOPY WITH PROPOFOL: SHX5780

## 2018-02-10 SURGERY — COLONOSCOPY WITH PROPOFOL
Anesthesia: General

## 2018-02-10 MED ORDER — MIDAZOLAM HCL 5 MG/5ML IJ SOLN
INTRAMUSCULAR | Status: DC | PRN
  Administered 2018-02-10: 2 mg via INTRAVENOUS

## 2018-02-10 MED ORDER — MIDAZOLAM HCL 2 MG/2ML IJ SOLN: 0.5000 mg | Freq: Once | INTRAMUSCULAR | Status: DC | PRN

## 2018-02-10 MED ORDER — HYDROMORPHONE HCL 1 MG/ML IJ SOLN: 0.2500 mg | INTRAMUSCULAR | Status: DC | PRN

## 2018-02-10 MED ORDER — CHLORHEXIDINE GLUCONATE CLOTH 2 % EX PADS: 6.0000 | MEDICATED_PAD | Freq: Once | CUTANEOUS | Status: DC

## 2018-02-10 MED ORDER — STERILE WATER FOR IRRIGATION IR SOLN
Status: DC | PRN
  Administered 2018-02-10: 1.5 mL

## 2018-02-10 MED ORDER — PROPOFOL 10 MG/ML IV BOLUS
INTRAVENOUS | Status: AC
  Filled 2018-02-10: qty 20

## 2018-02-10 MED ORDER — PROMETHAZINE HCL 25 MG/ML IJ SOLN: 6.2500 mg | INTRAMUSCULAR | Status: DC | PRN

## 2018-02-10 MED ORDER — MIDAZOLAM HCL 2 MG/2ML IJ SOLN
INTRAMUSCULAR | Status: AC
  Filled 2018-02-10: qty 2

## 2018-02-10 MED ORDER — LACTATED RINGERS IV SOLN
INTRAVENOUS | Status: DC
  Administered 2018-02-10: 1000 mL via INTRAVENOUS

## 2018-02-10 MED ORDER — HYDROCODONE-ACETAMINOPHEN 7.5-325 MG PO TABS: 1.0000 | ORAL_TABLET | Freq: Once | ORAL | Status: DC | PRN

## 2018-02-10 MED ORDER — PROPOFOL 500 MG/50ML IV EMUL
INTRAVENOUS | Status: DC | PRN
  Administered 2018-02-10: 12:00:00 via INTRAVENOUS
  Administered 2018-02-10: 175 ug/kg/min via INTRAVENOUS

## 2018-02-10 NOTE — Anesthesia Postprocedure Evaluation (Signed)
Anesthesia Post Note  Patient: Heather Davidson  Procedure(s) Performed: COLONOSCOPY WITH PROPOFOL (N/A )  Patient location during evaluation: PACU Anesthesia Type: MAC Level of consciousness: awake and alert and patient cooperative Pain management: pain level controlled Vital Signs Assessment: post-procedure vital signs reviewed and stable Respiratory status: spontaneous breathing, nonlabored ventilation and respiratory function stable Cardiovascular status: blood pressure returned to baseline Postop Assessment: no apparent nausea or vomiting Anesthetic complications: no     Last Vitals:  Vitals:   02/10/18 1120 02/10/18 1125  BP:    Pulse:    Resp: 20 20  Temp:    SpO2: 100% 100%    Last Pain:  Vitals:   02/10/18 1128  TempSrc:   PainSc: 0-No pain                 Tabytha Gradillas J

## 2018-02-10 NOTE — Op Note (Signed)
[facility] Patient Name: Heather Davidson Procedure Date: 02/10/2018 11:09 AM MRN: [id number] Date of Birth: 1961/02/05 Attending MD: Gennette Pac , MD CSN: [id number] Age: 57 Admit Type: Outpatient Procedure:                Colonoscopy Indications:              Screening for colorectal malignant neoplasm Providers:                Gennette Pac, MD, Buel Ream. Thomasena Edis RN, RN,                            Dyann Ruddle Referring MD:              Medicines:                Propofol per Anesthesia Complications:            No immediate complications. Estimated Blood Loss:     Estimated blood loss: none. Procedure:                Pre-Anesthesia Assessment:                           - Prior to the procedure, a History and Physical                            was performed, and patient medications and                            allergies were reviewed. The patient's tolerance of                            previous anesthesia was also reviewed. The risks                            and benefits of the procedure and the sedation                            options and risks were discussed with the patient.                            All questions were answered, and informed consent                            was obtained. Prior Anticoagulants: The patient has                            taken no previous anticoagulant or antiplatelet                            agents. ASA Grade Assessment: II - A patient with                            mild systemic disease. After reviewing the risks  and benefits, the patient was deemed in                            satisfactory condition to undergo the procedure.                           After obtaining informed consent, the colonoscope                            was passed under direct vision. Throughout the                            procedure, the patient's blood pressure, pulse, and   oxygen saturations were monitored continuously. The                            [id number]-[id number] ([id number]) scope was introduced through                            the and advanced to the the cecum, identified by                            appendiceal orifice and ileocecal valve. The                            colonoscopy was performed without difficulty. The                            patient tolerated the procedure well. The quality                            of the bowel preparation was adequate. Scope In: 11:35:02 AM Scope Out: 11:54:47 AM Scope Withdrawal Time: 0 hours 7 minutes 20 seconds  Total Procedure Duration: 0 hours 19 minutes 45 seconds  Findings:      The perianal and digital rectal examinations were normal.      Internal hemorrhoids were found during retroflexion. The hemorrhoids       were mild, small and Grade I (internal hemorrhoids that do not prolapse).      The exam was otherwise without abnormality on direct and retroflexion       views. Impression:               - Internal hemorrhoids.                           - The examination was otherwise normal on direct                            and retroflexion views.                           - No specimens collected. Moderate Sedation:      Moderate (conscious) sedation was personally administered by an       anesthesia professional. The following parameters were monitored: oxygen       saturation, heart rate, blood pressure, respiratory rate, EKG,  adequacy       of pulmonary ventilation, and response to care. Total physician       intraservice time was 23 minutes. Recommendation:           - Patient has a contact number available for                            emergencies. The signs and symptoms of potential                            delayed complications were discussed with the                            patient. Return to normal activities tomorrow.                            Written discharge instructions were provided to  the                            patient.                           - Resume previous diet.                           - Continue present medications.                           - Repeat colonoscopy in 10 years for screening                            purposes.                           - Return to GI office PRN. Procedure Code(s):        --- Professional ---                           [phone], Colonoscopy, flexible; diagnostic, including                            collection of specimen(s) by brushing or washing,                            when performed (separate procedure) Diagnosis Code(s):        --- Professional ---                           Z12.11, Encounter for screening for malignant                            neoplasm of colon                           K64.0, First degree hemorrhoids CPT copyright 2018 American Medical Association. All rights reserved. The codes documented in this report are preliminary and upon coder review may  be revised  to meet current compliance requirements. Gerrit Friends. Chelsee Hosie, MD Gennette Pac, MD 02/10/2018 12:00:30 PM This report has been signed electronically. Number of Addenda: 0

## 2018-02-10 NOTE — Transfer of Care (Signed)
Immediate Anesthesia Transfer of Care Note  Patient: Heather Davidson  Procedure(s) Performed: COLONOSCOPY WITH PROPOFOL (N/A )  Patient Location: PACU  Anesthesia Type:MAC  Level of Consciousness: awake and patient cooperative  Airway & Oxygen Therapy: Patient Spontanous Breathing and Patient connected to nasal cannula oxygen  Post-op Assessment: Report given to RN, Post -op Vital signs reviewed and stable and Patient moving all extremities  Post vital signs: Reviewed and stable  Last Vitals:  Vitals Value Taken Time  BP    Temp    Pulse 65 02/10/2018 12:05 PM  Resp    SpO2 73 % 02/10/2018 12:05 PM  Vitals shown include unvalidated device data.  Last Pain:  Vitals:   02/10/18 1128  TempSrc:   PainSc: 0-No pain      Patients Stated Pain Goal: 7 (42/10/31 2811)  Complications: No apparent anesthesia complications

## 2018-02-10 NOTE — Discharge Instructions (Signed)
Colonoscopy Discharge Instructions  Read the instructions outlined below and refer to this sheet in the next few weeks. These discharge instructions provide you with general information on caring for yourself after you leave the hospital. Your doctor may also give you specific instructions. While your treatment has been planned according to the most current medical practices available, unavoidable complications occasionally occur. If you have any problems or questions after discharge, call Dr. Jena Gauss at [phone]. ACTIVITY  You may resume your regular activity, but move at a slower pace for the next 24 hours.   Take frequent rest periods for the next 24 hours.   Walking will help get rid of the air and reduce the bloated feeling in your belly (abdomen).   No driving for 24 hours (because of the medicine (anesthesia) used during the test).    Do not sign any important legal documents or operate any machinery for 24 hours (because of the anesthesia used during the test).  NUTRITION  Drink plenty of fluids.   You may resume your normal diet as instructed by your doctor.   Begin with a light meal and progress to your normal diet. Heavy or fried foods are harder to digest and may make you feel sick to your stomach (nauseated).   Avoid alcoholic beverages for 24 hours or as instructed.  MEDICATIONS  You may resume your normal medications unless your doctor tells you otherwise.  WHAT YOU CAN EXPECT TODAY  Some feelings of bloating in the abdomen.   Passage of more gas than usual.   Spotting of blood in your stool or on the toilet paper.  IF YOU HAD POLYPS REMOVED DURING THE COLONOSCOPY:  No aspirin products for 7 days or as instructed.   No alcohol for 7 days or as instructed.   Eat a soft diet for the next 24 hours.  FINDING OUT THE RESULTS OF YOUR TEST Not all test results are available during your visit. If your test results are not back during the visit, make an appointment  with your caregiver to find out the results. Do not assume everything is normal if you have not heard from your caregiver or the medical facility. It is important for you to follow up on all of your test results.  SEEK IMMEDIATE MEDICAL ATTENTION IF:  You have more than a spotting of blood in your stool.   Your belly is swollen (abdominal distention).   You are nauseated or vomiting.   You have a temperature over 101.   You have abdominal pain or discomfort that is severe or gets worse throughout the day.    Your colon looked good today  I recommend one more screening colonoscopy in 10 years    Monitored Anesthesia Care, Care After These instructions provide you with information about caring for yourself after your procedure. Your health care provider may also give you more specific instructions. Your treatment has been planned according to current medical practices, but problems sometimes occur. Call your health care provider if you have any problems or questions after your procedure. What can I expect after the procedure? After your procedure, it is common to:  Feel sleepy for several hours.  Feel clumsy and have poor balance for several hours.  Feel forgetful about what happened after the procedure.  Have poor judgment for several hours.  Feel nauseous or vomit.  Have a sore throat if you had a breathing tube during the procedure.  Follow these instructions at home: For at least  24 hours after the procedure:   Do not: ? Participate in activities in which you could fall or become injured. ? Drive. ? Use heavy machinery. ? Drink alcohol. ? Take sleeping pills or medicines that cause drowsiness. ? Make important decisions or sign legal documents. ? Take care of children on your own.  Rest. Eating and drinking  Follow the diet that is recommended by your health care provider.  If you vomit, drink water, juice, or soup when you can drink without vomiting.  Make  sure you have little or no nausea before eating solid foods. General instructions  Have a responsible adult stay with you until you are awake and alert.  Take over-the-counter and prescription medicines only as told by your health care provider.  If you smoke, do not smoke without supervision.  Keep all follow-up visits as told by your health care provider. This is important. Contact a health care provider if:  You keep feeling nauseous or you keep vomiting.  You feel light-headed.  You develop a rash.  You have a fever. Get help right away if:  You have trouble breathing. This information is not intended to replace advice given to you by your health care provider. Make sure you discuss any questions you have with your health care provider. Document Released: 08/11/2015 Document Revised: 12/11/2015 Document Reviewed: 08/11/2015 Elsevier Interactive Patient Education  [employer].

## 2018-02-10 NOTE — H&P (Signed)
@LOGO @   Primary Care Physician:  Carylon Perches, MD Primary Gastroenterologist:  Dr. Jena Gauss  Pre-Procedure History & Physical: HPI:  Heather Davidson is a 57 y.o. female is here for a screening colonoscopy.  Negative colonoscopy 10 years ago.  No bowel symptoms currently.  No family history of colon cancer.  Past Medical History:  Diagnosis Date  . Anxiety   . Clostridium difficile colitis 12/2010  . Depression   . GERD (gastroesophageal reflux disease)   . HTN (hypertension)    physician stopped BP med  . Seasonal allergies     Past Surgical History:  Procedure Laterality Date  . ABDOMINAL HYSTERECTOMY    . CARPAL TUNNEL RELEASE Right 01/07/2018   Procedure: RIGHT CARPAL TUNNEL RELEASE;  Surgeon: Cindee Salt, MD;  Location: [facility];  Service: Orthopedics;  Laterality: Right;  FAB  . CERVICAL CONIZATION W/BX     for precervical cancer in remote past  . CESAREAN SECTION    . COLONOSCOPY  09/2007   anal papilla, normal colon and terminal ileum  . complete hysterectomy    . ESOPHAGOGASTRODUODENOSCOPY  09/2007   solitary inlet patch (bx neg), antral erosions  . TUBAL LIGATION      Prior to Admission medications   Medication Sig Start Date End Date Taking? Authorizing Provider  aspirin 81 MG tablet Take 81 mg by mouth daily.    Yes [provider]  atorvastatin (LIPITOR) 20 MG tablet Take 40 mg by mouth daily.    Yes [provider]  B Complex-C-Folic Acid (SUPER B COMPLEX/FA/VIT C) TABS Take 1 tablet by mouth daily.    Yes [provider]  Biotin (BIOTIN MAXIMUM STRENGTH) 10 MG TABS Take 10,000 mg by mouth daily.   Yes [provider]  Diethylpropion HCl CR 75 MG TB24 Take 75 mg by mouth daily.   Yes [provider]  docusate sodium (COLACE) 100 MG capsule Take 100 mg by mouth daily.   Yes [provider]  estradiol (CLIMARA - DOSED IN MG/24 HR) 0.05 mg/24hr patch Place 1 patch onto the skin 2 (two) times a  week.    Yes [provider]  eszopiclone (LUNESTA) 2 MG TABS tablet Take 2 mg by mouth at bedtime. Take immediately before bedtime   Yes [provider]  levocetirizine (XYZAL) 5 MG tablet Take 5 mg by mouth daily.   Yes [provider]  losartan (COZAAR) 100 MG tablet Take 100 mg by mouth daily.     Yes [provider]  montelukast (SINGULAIR) 10 MG tablet Take 10 mg by mouth at bedtime.  02/24/12  Yes [provider]  Multiple Vitamin (MULTIVITAMIN) capsule Take 1 capsule by mouth daily.   Yes [provider]  omeprazole (PRILOSEC OTC) 20 MG tablet Take 20 mg by mouth daily.   Yes [provider]  polyethylene glycol-electrolytes (TRILYTE) 420 g solution Take 4,000 mLs by mouth as directed. 02/07/18  Yes Evgenia Merriman, Gerrit Friends, MD  Probiotic Product (REZYST IM) CHEW Chew 1 tablet by mouth daily. 02/02/13  Yes Gelene Mink, NP  progesterone (PROMETRIUM) 100 MG capsule Take 100 mg by mouth daily.    Yes [provider]  RESTASIS 0.05 % ophthalmic emulsion Place 1 drop into both eyes 2 (two) times daily. 03/05/12  Yes [provider]  topiramate (TOPAMAX) 25 MG capsule Take 25 mg by mouth daily.    Yes [provider]  Turmeric 1053 MG TABS Take 1,053 mg by  mouth daily.    Yes [provider]  varenicline (CHANTIX CONTINUING MONTH PAK) 1 MG tablet Take 1 tablet (1 mg total) by mouth 2 (two) times daily. 12/08/17  Yes Rollene Rotunda, MD  vitamin B-12 (CYANOCOBALAMIN) 1000 MCG tablet Take 1,000 mcg by mouth daily.   Yes [provider]  HYDROcodone-acetaminophen (NORCO) 5-325 MG tablet Take 1 tablet by mouth every 6 (six) hours as needed. Patient not taking: Reported on 02/04/2018 01/07/18   Cindee Salt, MD  Potassium Chloride ER 20 MEQ TBCR Take 20 mEq by mouth 2 (two) times daily for 3 days. Patient not taking: Reported on 02/04/2018 12/14/17 12/17/17  Tiffany Kocher, PA-C  varenicline (CHANTIX STARTING  MONTH PAK) 0.5 MG X 11 & 1 MG X 42 tablet Take one 0.5 mg tablet by mouth once daily for 3 days, then increase to one 0.5 mg tablet twice daily for 4 days, then increase to one 1 mg tablet twice daily. 12/08/17   Rollene Rotunda, MD    Allergies as of 10/27/2017 - Review Complete 10/27/2017  Allergen Reaction Noted  . Ciprofloxacin Nausea And Vomiting 09/30/2010    Family History  Problem Relation Age of Onset  . Lung cancer Father        asbestosis  . COPD Mother        Died 59  . Colon cancer Neg Hx     Social History   Socioeconomic History  . Marital status: Married    Spouse name: Not on file  . Number of children: 1  . Years of education: Not on file  . Highest education level: Not on file  Occupational History    Employer: [employer]  Social Needs  . Financial resource strain: Not on file  . Food insecurity:    Worry: Not on file    Inability: Not on file  . Transportation needs:    Medical: Not on file    Non-medical: Not on file  Tobacco Use  . Smoking status: Current Every Day Smoker    Packs/day: 1.00    Years: 25.00    Pack years: 25.00    Types: Cigarettes  . Smokeless tobacco: Never Used  Substance and Sexual Activity  . Alcohol use: Yes    Alcohol/week: 0.0 standard drinks    Comment: 4 glass of red wine a night   . Drug use: No  . Sexual activity: Not on file  Lifestyle  . Physical activity:    Days per week: Not on file    Minutes per session: Not on file  . Stress: Not on file  Relationships  . Social connections:    Talks on phone: Not on file    Gets together: Not on file    Attends religious service: Not on file    Active member of club or organization: Not on file    Attends meetings of clubs or organizations: Not on file    Relationship status: Not on file  . Intimate partner violence:    Fear of current or ex partner: Not on file    Emotionally abused: Not on file    Physically abused: Not on file    Forced sexual activity:  Not on file  Other Topics Concern  . Not on file  Social History Narrative   Lives w/ husband   1 grown healthy son    Review of Systems: See HPI, otherwise negative ROS  Physical Exam: BP (!) 148/84   Pulse 79  Temp 97.8 F (36.6 C) (Oral)   Resp (!) 25   SpO2 100%  General:   Alert,  Well-developed, well-nourished, pleasant and cooperative in NAD Lungs:  Clear throughout to auscultation.   No wheezes, crackles, or rhonchi. No acute distress. Heart:  Regular rate and rhythm; no murmurs, clicks, rubs,  or gallops. Abdomen:  Soft, nontender and nondistended. No masses, hepatosplenomegaly or hernias noted. Normal bowel sounds, without guarding, and without rebound.   Impression/Plan: Heather Davidson is now here to undergo a screening colonoscopy.  Average risk screening examination.  Risks, benefits, limitations, imponderables and alternatives regarding colonoscopy have been reviewed with the patient. Questions have been answered. All parties agreeable.     Notice:  This dictation was prepared with Dragon dictation along with smaller phrase technology. Any transcriptional errors that result from this process are unintentional and may not be corrected upon review.

## 2018-02-10 NOTE — Anesthesia Preprocedure Evaluation (Signed)
Anesthesia Evaluation  Patient identified by MRN, date of birth, ID band Patient awake    Reviewed: Allergy & Precautions, NPO status , Patient's Chart, lab work & pertinent test results  Airway Mallampati: II  TM Distance: >3 FB Neck ROM: Full    Dental no notable dental hx. (+) Teeth Intact   Pulmonary neg pulmonary ROS, Current Smoker,  1PPD- + today  States uses Singulair for allergies  Denies Ventolin use     Pulmonary exam normal breath sounds clear to auscultation       Cardiovascular Exercise Tolerance: Good hypertension, Pt. on medications negative cardio ROS Normal cardiovascular examI Rhythm:Regular Rate:Normal     Neuro/Psych Anxiety Depression negative neurological ROS  negative psych ROS   GI/Hepatic Neg liver ROS, GERD  Medicated and Controlled,  Endo/Other  negative endocrine ROS  Renal/GU negative Renal ROS  negative genitourinary   Musculoskeletal negative musculoskeletal ROS (+)   Abdominal   Peds negative pediatric ROS (+)  Hematology negative hematology ROS (+)   Anesthesia Other Findings   Reproductive/Obstetrics negative OB ROS                             Anesthesia Physical Anesthesia Plan  ASA: II  Anesthesia Plan: General   Post-op Pain Management:    Induction: Intravenous  PONV Risk Score and Plan:   Airway Management Planned: Simple Face Mask and Nasal Cannula  Additional Equipment:   Intra-op Plan:   Post-operative Plan:   Informed Consent: I have reviewed the patients History and Physical, chart, labs and discussed the procedure including the risks, benefits and alternatives for the proposed anesthesia with the patient or authorized representative who has indicated his/her understanding and acceptance.   Dental advisory given  Plan Discussed with: CRNA  Anesthesia Plan Comments:         Anesthesia Quick Evaluation

## 2018-02-16 ENCOUNTER — Encounter (HOSPITAL_COMMUNITY): Payer: Self-pay | Admitting: Internal Medicine

## 2018-02-17 DIAGNOSIS — M5136 Other intervertebral disc degeneration, lumbar region: Secondary | ICD-10-CM | POA: Diagnosis not present

## 2018-03-17 ENCOUNTER — Ambulatory Visit (INDEPENDENT_AMBULATORY_CARE_PROVIDER_SITE_OTHER): Payer: BLUE CROSS/BLUE SHIELD | Admitting: Orthopaedic Surgery

## 2018-03-17 ENCOUNTER — Encounter (INDEPENDENT_AMBULATORY_CARE_PROVIDER_SITE_OTHER): Payer: Self-pay | Admitting: Orthopaedic Surgery

## 2018-03-17 VITALS — BP 134/90 | HR 100 | Ht 66.0 in | Wt 170.0 lb

## 2018-03-17 DIAGNOSIS — M542 Cervicalgia: Secondary | ICD-10-CM | POA: Diagnosis not present

## 2018-03-17 DIAGNOSIS — M545 Low back pain, unspecified: Secondary | ICD-10-CM | POA: Insufficient documentation

## 2018-03-17 DIAGNOSIS — G8929 Other chronic pain: Secondary | ICD-10-CM

## 2018-03-17 NOTE — Progress Notes (Signed)
Office Visit Note   Patient: Heather Davidson           Date of Birth: Oct 01, 1960           MRN: [id number] Visit Date: 03/17/2018              Requested by: Carylon Perches, MD [address] [place], [place] [address] PCP: Carylon Perches, MD   Assessment & Plan: Visit Diagnoses:  1. Neck pain   2. Chronic bilateral low back pain without sciatica     Plan: MRI scans were reviewed.  She does have some evidence of disc degeneration without severe compression her old scans that are 57 years old.  We will start with some physical therapy and recheck her in 6 weeks if she is having persistent symptoms we will consider reimaging studies at that time.  Likely had some lumbar disc bulge but is gotten some improvement with the prednisone pack.  No new prescriptions were given today.  Pathophysiology discussed MRI scans reviewed with patient recheck 6 weeks.  Smoking cessation encouraged.  Follow-Up Instructions: Return in about 6 weeks (around 04/28/2018).   Orders:  Orders Placed This Encounter  Procedures  . Ambulatory referral to Physical Therapy   No orders of the defined types were placed in this encounter.     Procedures: No procedures performed   Clinical Data: No additional findings.   Subjective: Chief Complaint  Patient presents with  . Lower Back - Pain  . Neck - Pain    HPI with the right 57 year old female new patient visit for chronic neck and low back pain.  She states she has had neck pain for 31 years constant stiff and aching.  Past history of cervical epidurals x3 and lumbar x2 by Dr. Eduard Clos who moved to [place].  She is had carpal tunnel releases by Dr. Cindee Salt with the right one in September.  She is seeing Dr. Ouida Sills last month when she had severe low back pain and difficulty walking had to use a cane could not get upright and was placed on prednisone muscle relaxant and states it got better.  Pain radiates from her back into her buttocks and her hips  really went past the knees.  Whenever she turns or twists she had significant increase in pain.  She is been through chiropractic treatment in the past.  Lumbar MRI December 2014 and 2012 as well as cervical MRI scan July 2012 are available for review.  Previous evaluation she was not felt to be a candidate for surgery at that time.  One point in the past she was on narcotic medication which she states really did not seem to help.  She was treated with Topamax that tends to help some hand tremor that her mother also had.  She is been on some Flexeril at night taken anti-inflammatories.  Review of Systems per day smoker times year she is trying to think about quitting.  Previous hysterectomy 2007, acid reflux hypertension chronic low back pain chronic neck pain.   Objective: Vital Signs: BP 134/90   Pulse 100   Ht 5\' 6"  (1.676 m)   Wt 170 lb (77.1 kg)   BMI 27.44 kg/m   Physical Exam  Constitutional: She is oriented to person, place, and time. She appears well-developed.  HENT:  Head: Normocephalic.  Right Ear: External ear normal.  Left Ear: External ear normal.  Eyes: Pupils are equal, round, and reactive to light.  Neck: No tracheal deviation present.  No thyromegaly present.  Cardiovascular: Normal rate.  Pulmonary/Chest: Effort normal. No respiratory distress. She has no wheezes.  Abdominal: Soft.  Neurological: She is alert and oriented to person, place, and time.  Skin: Skin is warm and dry.  Psychiatric: She has a normal mood and affect. Her behavior is normal.    Ortho Exam patient has increased the brachial plexus tenderness more in the left than right negative Spurling.  Upper extremity reflexes are 2+ notes impingement of right or left shoulder.  No evidence of peripheral nerve entrapment at the elbow.  Well-healed carpal tunnel incisions right and left.  No thenar atrophy.  Knee and ankle jerk are 2+ and symmetrical negative logroll to the hips she is able to heel and toe walk  with slight difficulty but no weakness.  Anterior tib gastrocsoleus is strong.  Distal pulses are intact.  Knees reach full extension no lower extremity upper extremity atrophy.  No supraclavicular lymphadenopathy.  Specialty Comments:  No specialty comments available.  Imaging: Lumbar MRI scan is reviewed with the patient from 2012 which showed small right-sided disc protrusion at L2-3 and left paracentral disc protrusion at L5-S1.  Cervical MRI scan showed slight foraminal narrowing at C5-6 and disc bulge with borderline central stenosis at C6-7.  Patient had some mild facet arthropathy in both cervical spine and lumbar spine.   PMFS History: Patient Active Problem List   Diagnosis Date Noted  . Neck pain 03/17/2018  . Chronic bilateral low back pain without sciatica 03/17/2018  . Encounter for screening colonoscopy 10/27/2017  . Failed conscious sedation during procedure 10/27/2017  . Chronic diarrhea 01/09/2015  . GERD (gastroesophageal reflux disease) 09/30/2010  . Constipation 09/30/2010   Past Medical History:  Diagnosis Date  . Anxiety   . Clostridium difficile colitis 12/2010  . Depression   . GERD (gastroesophageal reflux disease)   . HTN (hypertension)    physician stopped BP med  . Seasonal allergies     Family History  Problem Relation Age of Onset  . Lung cancer Father        asbestosis  . COPD Mother        Died 50  . Colon cancer Neg Hx     Past Surgical History:  Procedure Laterality Date  . ABDOMINAL HYSTERECTOMY    . CARPAL TUNNEL RELEASE Right 01/07/2018   Procedure: RIGHT CARPAL TUNNEL RELEASE;  Surgeon: Cindee Salt, MD;  Location: [facility];  Service: Orthopedics;  Laterality: Right;  FAB  . CERVICAL CONIZATION W/BX     for precervical cancer in remote past  . CESAREAN SECTION    . COLONOSCOPY  09/2007   anal papilla, normal colon and terminal ileum  . COLONOSCOPY WITH PROPOFOL N/A 02/10/2018   Procedure: COLONOSCOPY WITH PROPOFOL;   Surgeon: Corbin Ade, MD;  [facility];  Service: Endoscopy;  Laterality: N/A;  1:30pm  . complete hysterectomy    . ESOPHAGOGASTRODUODENOSCOPY  09/2007   solitary inlet patch (bx neg), antral erosions  . TUBAL LIGATION     Social History   Occupational History    Employer: [employer]  Tobacco Use  . Smoking status: Current Every Day Smoker    Packs/day: 1.00    Years: 25.00    Pack years: 25.00    Types: Cigarettes  . Smokeless tobacco: Never Used  Substance and Sexual Activity  . Alcohol use: Yes    Alcohol/week: 0.0 standard drinks    Comment: 4 glass of red wine  a night   . Drug use: No  . Sexual activity: Not on file

## 2018-03-23 ENCOUNTER — Ambulatory Visit (HOSPITAL_COMMUNITY): Payer: BLUE CROSS/BLUE SHIELD | Attending: Orthopaedic Surgery | Admitting: Physical Therapy

## 2018-03-23 ENCOUNTER — Other Ambulatory Visit: Payer: Self-pay

## 2018-03-23 DIAGNOSIS — M5412 Radiculopathy, cervical region: Secondary | ICD-10-CM | POA: Diagnosis not present
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
## 2018-03-23 NOTE — Patient Instructions (Addendum)
AROM: Lateral Neck Flexion    Slowly tilt head toward left  shoulder, . Hold each position _3-5___ seconds. Repeat __10__ times per set. Do _1___ sets per session. Do __3__ sessions per day.  http://orth.exer.us/296   Copyright  VHI. All rights reserved.  AROM: Neck Extension    Bend head backward. Hold _2___ seconds. Repeat __5__ times per set. Do __1__ sets per session. Do __3__ sessions per day.  http://orth.exer.us/300   Copyright  VHI. All rights reserved.  Strengthening: Extension - Isometric (in Neutral)    Using light pressure from fingertips at back of head, resist bending head backward. Hold _3-5___ seconds. Repeat __5-10__ times per set. Do _1___ sets per session. Do 2____ sessions per day.  http://orth.exer.us/308   Copyright  VHI. All rights reserved.  Strengthening: Extension - Isometric (in Neutral)    Using light pressure from fingertips at back of head, resist bending head backward. Hold ____ seconds. Repeat ____ times per set. Do ____ sets per session. Do ____ sessions per day.  http://orth.exer.us/308   Copyright  VHI. All rights reserved.  Strengthening: Lateral Bend - Isometric (in Neutral)    Using light pressure from fingertips, press into right temple. Resist bending head sideways. Hold _3-5___ seconds. Repeat _10___ times per set. Do _1___ sets per session. Do _2___ sessions per day.  http://orth.exer.us/302   Copyright  VHI. All rights reserved.  Scapular Retraction (Standing)   May do sitting  With arms at sides, pinch shoulder blades together. Repeat 10____ times per set. Do __1__ sets per session. Do _2___ sessions per day.  http://orth.exer.us/944   Copyright  VHI. All rights reserved.

## 2018-03-28 ENCOUNTER — Encounter (HOSPITAL_COMMUNITY): Payer: Self-pay

## 2018-03-28 ENCOUNTER — Ambulatory Visit (HOSPITAL_COMMUNITY): Payer: BLUE CROSS/BLUE SHIELD

## 2018-03-28 DIAGNOSIS — M5412 Radiculopathy, cervical region: Secondary | ICD-10-CM

## 2018-03-28 NOTE — Patient Instructions (Signed)
Levator Scapula Stretch, Sitting    Sit, one hand tucked under hip on side to be stretched, other hand over top of head. Turn head toward other side and look down. Use hand on head to gently stretch neck in that position. Hold 30___ seconds. Repeat _2__ times to each side per session. Do _1-2__ sessions per day. Stretches 1) ear to shoulder, 2) nose to opposite chest with hand and elbow in line with chest (crown of head), 3) ear to same chest, fingers on opposite ear.  Copyright  VHI. All rights reserved.  Upper Trapezius Stretch    Clasp hands behind head and gently push head down. Hold 30____ seconds. Repeat _2___ times. Do __1-2__ sessions per day.  http://[email or web address].exer.us/29   Copyright  VHI. All rights reserved.

## 2018-03-28 NOTE — Therapy (Signed)
[place] [facility] [address] [place], [place], [address] Phone: [phone]   Fax:  [phone]  Physical Therapy Treatment  Patient Details  Name: Heather Davidson MRN: [id number] Date of Birth: 1961/02/10 Referring Provider (PT): Annell Greening   Encounter Date: 03/28/2018  PT End of Session - 03/28/18 1031    Visit Number  2    Number of Visits  8    Date for PT Re-Evaluation  04/22/18    Authorization Type  BCBS    Authorization - Visit Number  2    Authorization - Number of Visits  30    PT Start Time  1032    PT Stop Time  1115    PT Time Calculation (min)  43 min    Activity Tolerance  Patient tolerated treatment well    Behavior During Therapy  [facility] for tasks assessed/performed       Past Medical History:  Diagnosis Date  . Anxiety   . Clostridium difficile colitis 12/2010  . Depression   . GERD (gastroesophageal reflux disease)   . HTN (hypertension)    physician stopped BP med  . Seasonal allergies     Past Surgical History:  Procedure Laterality Date  . ABDOMINAL HYSTERECTOMY    . CARPAL TUNNEL RELEASE Right 01/07/2018   Procedure: RIGHT CARPAL TUNNEL RELEASE;  Surgeon: Cindee Salt, MD;  Location: [facility];  Service: Orthopedics;  Laterality: Right;  FAB  . CERVICAL CONIZATION W/BX     for precervical cancer in remote past  . CESAREAN SECTION    . COLONOSCOPY  09/2007   anal papilla, normal colon and terminal ileum  . COLONOSCOPY WITH PROPOFOL N/A 02/10/2018   Procedure: COLONOSCOPY WITH PROPOFOL;  Surgeon: Corbin Ade, MD;  [facility];  Service: Endoscopy;  Laterality: N/A;  1:30pm  . complete hysterectomy    . ESOPHAGOGASTRODUODENOSCOPY  09/2007   solitary inlet patch (bx neg), antral erosions  . TUBAL LIGATION      There were no vitals filed for this visit.  Subjective Assessment - 03/28/18 1030    Subjective  8/10 pain today more central; doesn't go to her left shoulder right  now. Has been performing her HEP without problems.     Pertinent History  OA     Limitations  Sitting;Lifting;Standing;House hold activities    How long can you sit comfortably?  30 minutes     How long can you stand comfortably?  20 minutes     How long can you walk comfortably?  20 minutes    Patient Stated Goals  less pain    Pain Onset  More than a month ago                       [facility] Adult PT Treatment/Exercise - 03/28/18 0001      Exercises   Exercises  Neck      Neck Exercises: Theraband   Shoulder Extension  10 reps;Red    Rows  10 reps;Red      Neck Exercises: Seated   Cervical Isometrics  Extension;Right lateral flexion;Left lateral flexion;3 secs;10 reps    Neck Retraction  10 reps    Neck Retraction Limitations  scapular retraction x 10     Other Seated Exercise  Lt sb; extension x 10       Neck Exercises: Supine   Cervical Isometrics  Extension;3 secs;10 reps   w/ retraction  Manual Therapy   Manual Therapy  Soft tissue mobilization;Myofascial release    Manual therapy comments  completed separate from all other skilled interventions    Soft tissue mobilization  UT, levator Lt>Rt    Myofascial Release  suboccipital release             PT Education - 03/28/18 1030    Education Details  Reviewed eval, goals, HEP. Discussed purpose and technique of interventions throughout session.     Person(s) Educated  Patient    Methods  Explanation;Demonstration;Handout    Comprehension  Verbalized understanding;Need further instruction;Verbal cues required;Tactile cues required       PT Short Term Goals - 03/23/18 1619      PT SHORT TERM GOAL #1   Title  PT to be I in HEP to improve cervical and scapular strength to improve tolerance of functional use of pt UE's.     Time  2    Period  Weeks    Status  New    Target Date  04/06/18      PT SHORT TERM GOAL #2   Title  PT to cervical strength to be 4/5 to allow pt pain to be decreased to no  greater than a 6/10 to allow pt to be able to rest better.     Time  2    Period  Weeks    Status  New      PT SHORT TERM GOAL #3   Title  PT to be able to complete light activty, ie folding clothes, for 20 minutes without having increased pain.     Time  2    Period  Weeks    Status  New        PT Long Term Goals - 03/23/18 1622      PT LONG TERM GOAL #1   Title  Pt cervical strength to be at least a 4+/5 to allow pt pain level to be decreased by no greater than a 4/10 to allow pt to be able to complete light housework tasks for 30 minutes or greater.     Time  4    Period  Weeks    Status  New    Target Date  04/21/18      PT LONG TERM GOAL #2   Title  PT to be able to stablilze her cervical spine while putting dishes and groceries up to allow pt to complete these tasks without experiencing increaed pain.     Time  4    Period  Weeks    Status  New            Plan - 03/28/18 1031    Clinical Impression Statement  Continued with established plan of care. Reviewed goals, eval and initial HEP. Added Cspine and scapular stabilization exercises in supine and seated. Added upper trap and levator scapular stretches in seated to therapeutic exercises and HEP. Added soft tissue mobilizations and myofascial release to upper trap, suboccipitals and levator scapulae, left > right in supine. Patient reported 4/10 pain at end of session. Continue with current plan, progress as able.     Rehab Potential  Fair    PT Frequency  2x / week    PT Duration  4 weeks    PT Treatment/Interventions  ADLs/Self Care Home Management;Patient/family education;Manual techniques;Therapeutic activities;Therapeutic exercise;Traction    PT Next Visit Plan  t-band for posture, cervical retraction, maybe corner stretch    PT Home Exercise Plan  isometrics, scapular retraction, ; 03/28/2018 - upper trap, levator stretches       Patient will benefit from skilled therapeutic intervention in order to improve  the following deficits and impairments:  Decreased activity tolerance, Decreased range of motion, Decreased strength, Increased muscle spasms, Impaired perceived functional ability, Impaired UE functional use, Pain  Visit Diagnosis: Radiculopathy, cervical region     Problem List Patient Active Problem List   Diagnosis Date Noted  . Neck pain 03/17/2018  . Chronic bilateral low back pain without sciatica 03/17/2018  . Encounter for screening colonoscopy 10/27/2017  . Failed conscious sedation during procedure 10/27/2017  . Chronic diarrhea 01/09/2015  . GERD (gastroesophageal reflux disease) 09/30/2010  . Constipation 09/30/2010    Katina DungBarbara D. Hartnett-Rands, MS, PT [facility] [place] [id number] 03/28/2018, 11:33 AM  [facility] [address] [place], [place], [address] Phone: [phone]   Fax:  [phone]  Name: Mayer CamelJo Marie Davidson MRN: [id number] Date of Birth: 06/27/1960

## 2018-04-04 ENCOUNTER — Encounter

## 2018-04-04 ENCOUNTER — Ambulatory Visit (HOSPITAL_COMMUNITY): Payer: BLUE CROSS/BLUE SHIELD | Attending: Orthopaedic Surgery | Admitting: Physical Therapy

## 2018-04-04 ENCOUNTER — Telehealth (HOSPITAL_COMMUNITY): Payer: Self-pay | Admitting: Internal Medicine

## 2018-04-04 DIAGNOSIS — R29898 Other symptoms and signs involving the musculoskeletal system: Secondary | ICD-10-CM | POA: Diagnosis not present

## 2018-04-04 DIAGNOSIS — M545 Low back pain: Secondary | ICD-10-CM | POA: Diagnosis not present

## 2018-04-04 DIAGNOSIS — M5412 Radiculopathy, cervical region: Secondary | ICD-10-CM

## 2018-04-04 DIAGNOSIS — M6281 Muscle weakness (generalized): Secondary | ICD-10-CM | POA: Insufficient documentation

## 2018-04-04 DIAGNOSIS — G8929 Other chronic pain: Secondary | ICD-10-CM | POA: Insufficient documentation

## 2018-04-04 NOTE — Therapy (Signed)
[place] [facility] [address] [place], [place], [address] Phone: [phone]   Fax:  [phone]  Physical Therapy Treatment  Patient Details  Name: Heather Davidson MRN: [id number] Date of Birth: Mar 30, 1961 Referring Provider (PT): Annell Greening   Encounter Date: 04/04/2018  PT End of Session - 04/04/18 1203    Visit Number  3    Number of Visits  8    Date for PT Re-Evaluation  04/22/18    Authorization Type  BCBS    Authorization - Visit Number  3    Authorization - Number of Visits  30    PT Start Time  1119    PT Stop Time  1157    PT Time Calculation (min)  38 min    Activity Tolerance  Patient tolerated treatment well    Behavior During Therapy  [facility] for tasks assessed/performed       Past Medical History:  Diagnosis Date  . Anxiety   . Clostridium difficile colitis 12/2010  . Depression   . GERD (gastroesophageal reflux disease)   . HTN (hypertension)    physician stopped BP med  . Seasonal allergies     Past Surgical History:  Procedure Laterality Date  . ABDOMINAL HYSTERECTOMY    . CARPAL TUNNEL RELEASE Right 01/07/2018   Procedure: RIGHT CARPAL TUNNEL RELEASE;  Surgeon: Cindee Salt, MD;  Location: [facility];  Service: Orthopedics;  Laterality: Right;  FAB  . CERVICAL CONIZATION W/BX     for precervical cancer in remote past  . CESAREAN SECTION    . COLONOSCOPY  09/2007   anal papilla, normal colon and terminal ileum  . COLONOSCOPY WITH PROPOFOL N/A 02/10/2018   Procedure: COLONOSCOPY WITH PROPOFOL;  Surgeon: Corbin Ade, MD;  [facility];  Service: Endoscopy;  Laterality: N/A;  1:30pm  . complete hysterectomy    . ESOPHAGOGASTRODUODENOSCOPY  09/2007   solitary inlet patch (bx neg), antral erosions  . TUBAL LIGATION      There were no vitals filed for this visit.  Subjective Assessment - 04/04/18 1159    Subjective  Pt states her pain is slightly lower today at 7/10.  No radicular pain  in quite sometime.  Lt is worse than right and located central cervical and thoracic spine.    Currently in Pain?  Yes    Pain Score  7     Pain Location  Neck    Pain Orientation  Mid    Pain Descriptors / Indicators  Aching    Pain Type  Chronic pain                       [facility] Adult PT Treatment/Exercise - 04/04/18 0001      Exercises   Exercises  Neck      Neck Exercises: Theraband   Scapula Retraction  10 reps;Red    Shoulder Extension  10 reps;Red    Rows  10 reps;Red      Neck Exercises: Seated   Cervical Isometrics  Extension;Right lateral flexion;Left lateral flexion;3 secs;10 reps    Neck Retraction  10 reps    Neck Retraction Limitations  scapular retraction x 10     Other Seated Exercise  Lt sb; extension x 10       Neck Exercises: Supine   Cervical Isometrics  Extension;3 secs;10 reps      Manual Therapy   Manual Therapy  Soft tissue mobilization;Myofascial release  Manual therapy comments  completed separate from all other skilled interventions    Soft tissue mobilization  prone UT, levator Lt>Rt    Myofascial Release  suboccipital release supine      Neck Exercises: Stretches   Corner Stretch  3 reps;30 seconds               PT Short Term Goals - 03/23/18 1619      PT SHORT TERM GOAL #1   Title  PT to be I in HEP to improve cervical and scapular strength to improve tolerance of functional use of pt UE's.     Time  2    Period  Weeks    Status  New    Target Date  04/06/18      PT SHORT TERM GOAL #2   Title  PT to cervical strength to be 4/5 to allow pt pain to be decreased to no greater than a 6/10 to allow pt to be able to rest better.     Time  2    Period  Weeks    Status  New      PT SHORT TERM GOAL #3   Title  PT to be able to complete light activty, ie folding clothes, for 20 minutes without having increased pain.     Time  2    Period  Weeks    Status  New        PT Long Term Goals - 03/23/18 1622      PT  LONG TERM GOAL #1   Title  Pt cervical strength to be at least a 4+/5 to allow pt pain level to be decreased by no greater than a 4/10 to allow pt to be able to complete light housework tasks for 30 minutes or greater.     Time  4    Period  Weeks    Status  New    Target Date  04/21/18      PT LONG TERM GOAL #2   Title  PT to be able to stablilze her cervical spine while putting dishes and groceries up to allow pt to complete these tasks without experiencing increaed pain.     Time  4    Period  Weeks    Status  New            Plan - 04/04/18 1201    Clinical Impression Statement  contiued with stab and stretches.  Added corner stretch today with good stretch obtained.  Cues needed with postural strengthening for correct form.  Completed manual mostly in prone to work on paraspinals and trap mm.  Large spasm Lt trap decreased but not resolved with manual techniques.  pt reported overall improvmenet with reduction of pain at end of session.     Rehab Potential  Fair    PT Frequency  2x / week    PT Duration  4 weeks    PT Treatment/Interventions  ADLs/Self Care Home Management;Patient/family education;Manual techniques;Therapeutic activities;Therapeutic exercise;Traction    PT Next Visit Plan  continue with postural strengthening.  Begin prone strengthening exercises next session., UBE warm up.    PT Home Exercise Plan  isometrics, scapular retraction, ; 03/28/2018 - upper trap, levator stretches       Patient will benefit from skilled therapeutic intervention in order to improve the following deficits and impairments:  Decreased activity tolerance, Decreased range of motion, Decreased strength, Increased muscle spasms, Impaired perceived functional ability, Impaired UE functional use,  Pain  Visit Diagnosis: Radiculopathy, cervical region     Problem List Patient Active Problem List   Diagnosis Date Noted  . Neck pain 03/17/2018  . Chronic bilateral low back pain without  sciatica 03/17/2018  . Encounter for screening colonoscopy 10/27/2017  . Failed conscious sedation during procedure 10/27/2017  . Chronic diarrhea 01/09/2015  . GERD (gastroesophageal reflux disease) 09/30/2010  . Constipation 09/30/2010   Heather Davidson , PTA/CLT [phone]  Heather Davidson,  Davidson 04/04/2018, 12:04 PM  [facility] [address] [place], [place], [address] Phone: [phone]   Fax:  [phone]  Name: Heather Davidson MRN: [id number] Date of Birth: 04/06/1961

## 2018-04-04 NOTE — Telephone Encounter (Signed)
04/04/18  Left a message to offer an appt today or Tuesday from the wait list.

## 2018-04-07 ENCOUNTER — Ambulatory Visit (HOSPITAL_COMMUNITY): Payer: BLUE CROSS/BLUE SHIELD | Admitting: Physical Therapy

## 2018-04-07 DIAGNOSIS — M545 Low back pain: Secondary | ICD-10-CM | POA: Diagnosis not present

## 2018-04-07 DIAGNOSIS — M5412 Radiculopathy, cervical region: Secondary | ICD-10-CM

## 2018-04-07 DIAGNOSIS — M6281 Muscle weakness (generalized): Secondary | ICD-10-CM | POA: Diagnosis not present

## 2018-04-07 DIAGNOSIS — R29898 Other symptoms and signs involving the musculoskeletal system: Secondary | ICD-10-CM | POA: Diagnosis not present

## 2018-04-07 DIAGNOSIS — G8929 Other chronic pain: Secondary | ICD-10-CM | POA: Diagnosis not present

## 2018-04-07 NOTE — Therapy (Signed)
[place] [facility] [address] [place], [place], [address] Phone: [phone]   Fax:  [phone]  Physical Therapy Treatment  Patient Details  Name: Heather Davidson MRN: [id number] Date of Birth: 19-May-1960 Referring Provider (PT): Annell Greening   Encounter Date: 04/07/2018  PT End of Session - 04/07/18 1054    Visit Number  4    Number of Visits  8    Date for PT Re-Evaluation  04/22/18    Authorization Type  BCBS    Authorization - Visit Number  4    Authorization - Number of Visits  30    PT Start Time  1032    PT Stop Time  1110    PT Time Calculation (min)  38 min    Activity Tolerance  Patient tolerated treatment well    Behavior During Therapy  [facility] for tasks assessed/performed       Past Medical History:  Diagnosis Date  . Anxiety   . Clostridium difficile colitis 12/2010  . Depression   . GERD (gastroesophageal reflux disease)   . HTN (hypertension)    physician stopped BP med  . Seasonal allergies     Past Surgical History:  Procedure Laterality Date  . ABDOMINAL HYSTERECTOMY    . CARPAL TUNNEL RELEASE Right 01/07/2018   Procedure: RIGHT CARPAL TUNNEL RELEASE;  Surgeon: Cindee Salt, MD;  Location: [facility];  Service: Orthopedics;  Laterality: Right;  FAB  . CERVICAL CONIZATION W/BX     for precervical cancer in remote past  . CESAREAN SECTION    . COLONOSCOPY  09/2007   anal papilla, normal colon and terminal ileum  . COLONOSCOPY WITH PROPOFOL N/A 02/10/2018   Procedure: COLONOSCOPY WITH PROPOFOL;  Surgeon: Corbin Ade, MD;  [facility];  Service: Endoscopy;  Laterality: N/A;  1:30pm  . complete hysterectomy    . ESOPHAGOGASTRODUODENOSCOPY  09/2007   solitary inlet patch (bx neg), antral erosions  . TUBAL LIGATION      There were no vitals filed for this visit.  Subjective Assessment - 04/07/18 1055    Subjective  pt states her back is hurting worse than her neck.  Currently 5/10 neck  7/10 back.    Currently in Pain?  Yes    Pain Score  5     Pain Location  Neck    Pain Orientation  Mid    Pain Descriptors / Indicators  Aching                       [facility] Adult PT Treatment/Exercise - 04/07/18 0001      Exercises   Exercises  Neck      Neck Exercises: Theraband   Scapula Retraction  15 reps;Red    Shoulder Extension  15 reps;Red    Rows  15 reps;Red      Neck Exercises: Standing   Upper Extremity Flexion with Stabilization  10 reps      Neck Exercises: Seated   Neck Retraction  10 reps    Neck Retraction Limitations  scapular retraction x 10       Neck Exercises: Supine   Cervical Isometrics  Right lateral flexion;Left lateral flexion      Neck Exercises: Prone   Axial Exension  10 reps    Shoulder Extension  10 reps    Rows  10 reps    Upper Extremity Flexion with Stabilization  10 reps  Manual Therapy   Manual Therapy  Soft tissue mobilization;Myofascial release    Manual therapy comments  completed separate from all other skilled interventions    Soft tissue mobilization  prone UT, levator Lt>Rt    Myofascial Release  suboccipital release supine               PT Short Term Goals - 03/23/18 1619      PT SHORT TERM GOAL #1   Title  PT to be I in HEP to improve cervical and scapular strength to improve tolerance of functional use of pt UE's.     Time  2    Period  Weeks    Status  New    Target Date  04/06/18      PT SHORT TERM GOAL #2   Title  PT to cervical strength to be 4/5 to allow pt pain to be decreased to no greater than a 6/10 to allow pt to be able to rest better.     Time  2    Period  Weeks    Status  New      PT SHORT TERM GOAL #3   Title  PT to be able to complete light activty, ie folding clothes, for 20 minutes without having increased pain.     Time  2    Period  Weeks    Status  New        PT Long Term Goals - 03/23/18 1622      PT LONG TERM GOAL #1   Title  Pt cervical strength to be  at least a 4+/5 to allow pt pain level to be decreased by no greater than a 4/10 to allow pt to be able to complete light housework tasks for 30 minutes or greater.     Time  4    Period  Weeks    Status  New    Target Date  04/21/18      PT LONG TERM GOAL #2   Title  PT to be able to stablilze her cervical spine while putting dishes and groceries up to allow pt to complete these tasks without experiencing increaed pain.     Time  4    Period  Weeks    Status  New            Plan - 04/07/18 1111    Clinical Impression Statement  contiued wiht established POC.  Added prone postural exercises this session with good form obtained.  Noted weakness in UT mm.  Less cues needed with theraband postural strenghtening and able to increase reps.  General tightness in upper trap, paraspinal mm reduced this session with manual.     Rehab Potential  Fair    PT Frequency  2x / week    PT Duration  4 weeks    PT Treatment/Interventions  ADLs/Self Care Home Management;Patient/family education;Manual techniques;Therapeutic activities;Therapeutic exercise;Traction    PT Next Visit Plan  continue with postural strengthening and manual as needed.     PT Home Exercise Plan  isometrics, scapular retraction, ; 03/28/2018 - upper trap, levator stretches       Patient will benefit from skilled therapeutic intervention in order to improve the following deficits and impairments:  Decreased activity tolerance, Decreased range of motion, Decreased strength, Increased muscle spasms, Impaired perceived functional ability, Impaired UE functional use, Pain  Visit Diagnosis: Radiculopathy, cervical region     Problem List Patient Active Problem List   Diagnosis Date  Noted  . Neck pain 03/17/2018  . Chronic bilateral low back pain without sciatica 03/17/2018  . Encounter for screening colonoscopy 10/27/2017  . Failed conscious sedation during procedure 10/27/2017  . Chronic diarrhea 01/09/2015  . GERD  (gastroesophageal reflux disease) 09/30/2010  . Constipation 09/30/2010   Lurena Nida, PTA/CLT [phone]  Lurena Nida 04/07/2018, 11:13 AM  [facility] [address] [place], [place], [address] Phone: [phone]   Fax:  [phone]  Name: Erryn Dickison MRN: [id number] Date of Birth: [birth year]

## 2018-04-11 ENCOUNTER — Ambulatory Visit (HOSPITAL_COMMUNITY): Payer: BLUE CROSS/BLUE SHIELD | Admitting: Physical Therapy

## 2018-04-11 DIAGNOSIS — M5412 Radiculopathy, cervical region: Secondary | ICD-10-CM

## 2018-04-11 DIAGNOSIS — R29898 Other symptoms and signs involving the musculoskeletal system: Secondary | ICD-10-CM | POA: Diagnosis not present

## 2018-04-11 DIAGNOSIS — G8929 Other chronic pain: Secondary | ICD-10-CM | POA: Diagnosis not present

## 2018-04-11 DIAGNOSIS — M545 Low back pain: Secondary | ICD-10-CM | POA: Diagnosis not present

## 2018-04-11 DIAGNOSIS — M6281 Muscle weakness (generalized): Secondary | ICD-10-CM | POA: Diagnosis not present

## 2018-04-11 NOTE — Therapy (Signed)
[place], [place], [address] Phone: [phone]   Fax:  [phone]  Physical Therapy Treatment  Patient Details  Name: Heather Davidson MRN: [id number] Date of Birth: Sep 02, 1960 Referring Provider (PT): Rodell Perna   Encounter Date: 04/11/2018  PT End of Session - 04/11/18 1059    Visit Number  5    Number of Visits  8    Date for PT Re-Evaluation  04/22/18    Authorization Type  BCBS    Authorization - Visit Number  5    Authorization - Number of Visits  30    PT Start Time  6568    PT Stop Time  1113    PT Time Calculation (min)  43 min    Activity Tolerance  Patient tolerated treatment well    Behavior During Therapy  [facility] for tasks assessed/performed       Past Medical History:  Diagnosis Date  . Anxiety   . Clostridium difficile colitis 12/2010  . Depression   . GERD (gastroesophageal reflux disease)   . HTN (hypertension)    physician stopped BP med  . Seasonal allergies     Past Surgical History:  Procedure Laterality Date  . ABDOMINAL HYSTERECTOMY    . CARPAL TUNNEL RELEASE Right 01/07/2018   Procedure: RIGHT CARPAL TUNNEL RELEASE;  Surgeon: Daryll Brod, MD;  Location: [place];  Service: Orthopedics;  Laterality: Right;  FAB  . CERVICAL CONIZATION W/BX     for precervical cancer in remote past  . CESAREAN SECTION    . COLONOSCOPY  09/2007   anal papilla, normal colon and terminal ileum  . COLONOSCOPY WITH PROPOFOL N/A 02/10/2018   Procedure: COLONOSCOPY WITH PROPOFOL;  Surgeon: Daneil Dolin, MD;  [facility];  Service: Endoscopy;  Laterality: N/A;  1:30pm  . complete hysterectomy    . ESOPHAGOGASTRODUODENOSCOPY  09/2007   solitary inlet patch (bx neg), antral erosions  . TUBAL LIGATION      There were no vitals filed for this visit.  Subjective Assessment - 04/11/18 1031    Subjective  PT states that she continues to complete her exercises.  She put the  lights on her tree yesterday and it about killed her     Pertinent History  OA     Limitations  Sitting;Lifting;Standing;House hold activities    How long can you sit comfortably?  30 minutes     How long can you stand comfortably?  20 minutes     How long can you walk comfortably?  20 minutes    Patient Stated Goals  less pain    Currently in Pain?  Yes    Pain Score  5     Pain Descriptors / Indicators  Other (Comment)   stiffness    Pain Type  Chronic pain    Pain Radiating Towards  none    Pain Onset  More than a month ago    Aggravating Factors   activity     Pain Relieving Factors  meds    Effect of Pain on Daily Activities  limits                        [facility] Adult PT Treatment/Exercise - 04/11/18 0001      Exercises   Exercises  Neck      Neck Exercises: Theraband   Scapula Retraction  10 reps;Green    Scapula Retraction  Limitations  HEP from now on     Shoulder Extension  10 reps;Green    Shoulder Extension Limitations  HEP from now on     Rows  10 reps;Green    Rows Limitations  HEP from now on       Neck Exercises: Standing   Other Standing Exercises  cervical and hip excusion x 3    Other Standing Exercises  scapular and cervical retracion combined x 10       Neck Exercises: Seated   X to V  10 reps    W Back  10 reps    Shoulder Shrugs  5 reps    Shoulder Shrugs Limitations  up back relax     Shoulder Rolls  Backwards;10 reps      Neck Exercises: Prone   Neck Retraction  10 reps    Shoulder Extension  10 reps    Rows  10 reps    Rows Weights (lbs)  2#    Upper Extremity Flexion with Stabilization  10 reps      Manual Therapy   Manual Therapy  Soft tissue mobilization;Myofascial release    Manual therapy comments  completed separate from all other skilled interventions    Soft tissue mobilization  prone UT, levator Lt>Rt    Myofascial Release  suboccipital release supine             PT Education - 04/11/18 1058    Education  Details  hep FOR t-band postural mm     Methods  Explanation;Demonstration;Verbal cues;Handout    Comprehension  Returned demonstration       PT Short Term Goals - 04/11/18 1117      PT SHORT TERM GOAL #1   Title  PT to be I in HEP to improve cervical and scapular strength to improve tolerance of functional use of pt UE's.     Time  2    Period  Weeks    Status  Partially Met      PT SHORT TERM GOAL #2   Title  PT to cervical strength to be 4/5 to allow pt pain to be decreased to no greater than a 6/10 to allow pt to be able to rest better.     Time  2    Period  Weeks    Status  On-going      PT SHORT TERM GOAL #3   Title  PT to be able to complete light activty, ie folding clothes, for 20 minutes without having increased pain.     Time  2    Period  Weeks    Status  On-going        PT Long Term Goals - 04/11/18 1117      PT LONG TERM GOAL #1   Title  Pt cervical strength to be at least a 4+/5 to allow pt pain level to be decreased by no greater than a 4/10 to allow pt to be able to complete light housework tasks for 30 minutes or greater.     Time  4    Period  Weeks    Status  On-going      PT LONG TERM GOAL #2   Title  PT to be able to stablilze her cervical spine while putting dishes and groceries up to allow pt to complete these tasks without experiencing increaed pain.     Time  4    Period  Weeks    Status  On-going  Plan - 04/11/18 1059    Clinical Impression Statement  PT given T-band for home postural exercises. Began excursion exercises for cervical and lumbar area.  Prone exercises remain challenging for pt.     Rehab Potential  Fair    PT Frequency  2x / week    PT Duration  4 weeks    PT Treatment/Interventions  ADLs/Self Care Home Management;Patient/family education;Manual techniques;Therapeutic activities;Therapeutic exercise;Traction    PT Next Visit Plan  continue with postural strengthening and manual as needed.     PT Home  Exercise Plan  isometrics, scapular retraction, ; 03/28/2018 - upper trap, levator stretches       Patient will benefit from skilled therapeutic intervention in order to improve the following deficits and impairments:  Decreased activity tolerance, Decreased range of motion, Decreased strength, Increased muscle spasms, Impaired perceived functional ability, Impaired UE functional use, Pain  Visit Diagnosis: Radiculopathy, cervical region     Problem List Patient Active Problem List   Diagnosis Date Noted  . Neck pain 03/17/2018  . Chronic bilateral low back pain without sciatica 03/17/2018  . Encounter for screening colonoscopy 10/27/2017  . Failed conscious sedation during procedure 10/27/2017  . Chronic diarrhea 01/09/2015  . GERD (gastroesophageal reflux disease) 09/30/2010  . Constipation 09/30/2010   Rayetta Humphrey, PT CLT [phone] 04/11/2018, 11:18 AM  [place] [address] [place], [place], [address] Phone: [phone]   Fax:  [phone]  Name: Heather Davidson MRN: [id number] Date of Birth: [birth year]

## 2018-04-14 ENCOUNTER — Ambulatory Visit (HOSPITAL_COMMUNITY): Payer: BLUE CROSS/BLUE SHIELD | Admitting: Physical Therapy

## 2018-04-14 DIAGNOSIS — G8929 Other chronic pain: Secondary | ICD-10-CM | POA: Diagnosis not present

## 2018-04-14 DIAGNOSIS — M5412 Radiculopathy, cervical region: Secondary | ICD-10-CM

## 2018-04-14 DIAGNOSIS — M545 Low back pain: Secondary | ICD-10-CM | POA: Diagnosis not present

## 2018-04-14 DIAGNOSIS — R29898 Other symptoms and signs involving the musculoskeletal system: Secondary | ICD-10-CM | POA: Diagnosis not present

## 2018-04-14 DIAGNOSIS — M6281 Muscle weakness (generalized): Secondary | ICD-10-CM | POA: Diagnosis not present

## 2018-04-14 NOTE — Therapy (Signed)
[place], [place], [address] Phone: [phone]   Fax:  [phone]  Physical Therapy Treatment  Patient Details  Name: Heather Davidson MRN: [id number] Date of Birth: 1960-05-26 Referring Provider (PT): Rodell Perna   Encounter Date: 04/14/2018  PT End of Session - 04/14/18 1116    Visit Number  6    Number of Visits  8    Date for PT Re-Evaluation  04/22/18    Authorization Type  BCBS    Authorization - Visit Number  6    Authorization - Number of Visits  30    PT Start Time  1324    PT Stop Time  1115    PT Time Calculation (min)  45 min    Activity Tolerance  Patient tolerated treatment well    Behavior During Therapy  [facility] for tasks assessed/performed       Past Medical History:  Diagnosis Date  . Anxiety   . Clostridium difficile colitis 12/2010  . Depression   . GERD (gastroesophageal reflux disease)   . HTN (hypertension)    physician stopped BP med  . Seasonal allergies     Past Surgical History:  Procedure Laterality Date  . ABDOMINAL HYSTERECTOMY    . CARPAL TUNNEL RELEASE Right 01/07/2018   Procedure: RIGHT CARPAL TUNNEL RELEASE;  Surgeon: Daryll Brod, MD;  Location: [place];  Service: Orthopedics;  Laterality: Right;  FAB  . CERVICAL CONIZATION W/BX     for precervical cancer in remote past  . CESAREAN SECTION    . COLONOSCOPY  09/2007   anal papilla, normal colon and terminal ileum  . COLONOSCOPY WITH PROPOFOL N/A 02/10/2018   Procedure: COLONOSCOPY WITH PROPOFOL;  Surgeon: Daneil Dolin, MD;  [facility];  Service: Endoscopy;  Laterality: N/A;  1:30pm  . complete hysterectomy    . ESOPHAGOGASTRODUODENOSCOPY  09/2007   solitary inlet patch (bx neg), antral erosions  . TUBAL LIGATION      There were no vitals filed for this visit.  Subjective Assessment - 04/14/18 1133    Subjective  Pt states she is doing better today . Most discomfort is at C7 today.       Currently in Pain?  Yes    Pain Score  4     Pain Location  Neck    Pain Orientation  Mid                       [facility] Adult PT Treatment/Exercise - 04/14/18 0001      Exercises   Exercises  Neck      Neck Exercises: Standing   Upper Extremity Flexion with Stabilization  10 reps    Other Standing Exercises  thoracic exc with UE movements  and hip excusion x 3 5 reps each    Other Standing Exercises  scapular and cervical retracion combined x 10       Neck Exercises: Seated   X to V  10 reps    W Back  10 reps      Neck Exercises: Prone   Neck Retraction  10 reps    Shoulder Extension  10 reps    Rows  10 reps    Rows Weights (lbs)  2#    Upper Extremity Flexion with Stabilization  10 reps      Manual Therapy   Manual Therapy  Soft tissue mobilization;Myofascial release;Other (comment)  Manual therapy comments  completed separate from all other skilled interventions    Soft tissue mobilization  prone UT, levator Lt>Rt    Myofascial Release  suboccipital release supine    Other Manual Therapy  gentle distraction 3X20"               PT Short Term Goals - 04/11/18 1117      PT SHORT TERM GOAL #1   Title  PT to be I in HEP to improve cervical and scapular strength to improve tolerance of functional use of pt UE's.     Time  2    Period  Weeks    Status  Partially Met      PT SHORT TERM GOAL #2   Title  PT to cervical strength to be 4/5 to allow pt pain to be decreased to no greater than a 6/10 to allow pt to be able to rest better.     Time  2    Period  Weeks    Status  On-going      PT SHORT TERM GOAL #3   Title  PT to be able to complete light activty, ie folding clothes, for 20 minutes without having increased pain.     Time  2    Period  Weeks    Status  On-going        PT Long Term Goals - 04/11/18 1117      PT LONG TERM GOAL #1   Title  Pt cervical strength to be at least a 4+/5 to allow pt pain level to be decreased by no  greater than a 4/10 to allow pt to be able to complete light housework tasks for 30 minutes or greater.     Time  4    Period  Weeks    Status  On-going      PT LONG TERM GOAL #2   Title  PT to be able to stablilze her cervical spine while putting dishes and groceries up to allow pt to complete these tasks without experiencing increaed pain.     Time  4    Period  Weeks    Status  On-going            Plan - 04/14/18 1130    Clinical Impression Statement  continued with postural strengthening with addition of thoracic excursions this session.  Utilized UE movements with excursions to increase AROM in thoracic/cervical region today.  Pt with noted restrictions with rotational movement due to discomfort.  Continued with manual with reduced restrictions noted in cervical/thoracic regions, gentle distraction in supine yeilded good results and relief per patient.  Overall improving.     Rehab Potential  Fair    PT Frequency  2x / week    PT Duration  4 weeks    PT Treatment/Interventions  ADLs/Self Care Home Management;Patient/family education;Manual techniques;Therapeutic activities;Therapeutic exercise;Traction    PT Next Visit Plan  continue with postural strengthening and manual as needed.   Returns to MD next thursday so may need progress note.    PT Home Exercise Plan  isometrics, scapular retraction, ; 03/28/2018 - upper trap, levator stretches       Patient will benefit from skilled therapeutic intervention in order to improve the following deficits and impairments:  Decreased activity tolerance, Decreased range of motion, Decreased strength, Increased muscle spasms, Impaired perceived functional ability, Impaired UE functional use, Pain  Visit Diagnosis: Radiculopathy, cervical region     Problem List Patient  Active Problem List   Diagnosis Date Noted  . Neck pain 03/17/2018  . Chronic bilateral low back pain without sciatica 03/17/2018  . Encounter for screening  colonoscopy 10/27/2017  . Failed conscious sedation during procedure 10/27/2017  . Chronic diarrhea 01/09/2015  . GERD (gastroesophageal reflux disease) 09/30/2010  . Constipation 09/30/2010   Teena Irani, PTA/CLT [phone]  Teena Irani 04/14/2018, 11:35 AM  [place], [place], [address] Phone: [phone]   Fax:  [phone]  Name: Heather Davidson MRN: [id number] Date of Birth: 09-Jul-1960

## 2018-04-19 ENCOUNTER — Ambulatory Visit (HOSPITAL_COMMUNITY): Payer: BLUE CROSS/BLUE SHIELD

## 2018-04-19 ENCOUNTER — Encounter (HOSPITAL_COMMUNITY): Payer: Self-pay

## 2018-04-19 DIAGNOSIS — M545 Low back pain: Secondary | ICD-10-CM | POA: Diagnosis not present

## 2018-04-19 DIAGNOSIS — M6281 Muscle weakness (generalized): Secondary | ICD-10-CM | POA: Diagnosis not present

## 2018-04-19 DIAGNOSIS — R29898 Other symptoms and signs involving the musculoskeletal system: Secondary | ICD-10-CM | POA: Diagnosis not present

## 2018-04-19 DIAGNOSIS — M5412 Radiculopathy, cervical region: Secondary | ICD-10-CM

## 2018-04-19 DIAGNOSIS — G8929 Other chronic pain: Secondary | ICD-10-CM | POA: Diagnosis not present

## 2018-04-19 NOTE — Therapy (Signed)
[facility] [address] [place], [place], [address] Phone: [phone]   Fax:  [phone]   Progress Note Reporting Period 03/23/18 to 04/19/18  See note below for Objective Data and Assessment of Progress/Goals.   Physical Therapy Treatment  Patient Details  Name: Heather Davidson MRN: [id number] Date of Birth: 12/09/1960 Referring Provider (PT): Annell GreeningMark Yates   Encounter Date: 04/19/2018  PT End of Session - 04/19/18 1029    Visit Number  7    Number of Visits  16    Date for PT Re-Evaluation  05/17/18    Authorization Type  BCBS    Authorization - Visit Number  7    Authorization - Number of Visits  30    PT Start Time  1030    PT Stop Time  1110    PT Time Calculation (min)  40 min    Activity Tolerance  Patient tolerated treatment well    Behavior During Therapy  [facility] for tasks assessed/performed       Past Medical History:  Diagnosis Date  . Anxiety   . Clostridium difficile colitis 12/2010  . Depression   . GERD (gastroesophageal reflux disease)   . HTN (hypertension)    physician stopped BP med  . Seasonal allergies     Past Surgical History:  Procedure Laterality Date  . ABDOMINAL HYSTERECTOMY    . CARPAL TUNNEL RELEASE Right 01/07/2018   Procedure: RIGHT CARPAL TUNNEL RELEASE;  Surgeon: Cindee SaltKuzma, Gary, MD;  Location: [facility];  Service: Orthopedics;  Laterality: Right;  FAB  . CERVICAL CONIZATION W/BX     for precervical cancer in remote past  . CESAREAN SECTION    . COLONOSCOPY  09/2007   anal papilla, normal colon and terminal ileum  . COLONOSCOPY WITH PROPOFOL N/A 02/10/2018   Procedure: COLONOSCOPY WITH PROPOFOL;  Surgeon: Corbin Adeourk, Robert M, MD;  [facility];  Service: Endoscopy;  Laterality: N/A;  1:30pm  . complete hysterectomy    . ESOPHAGOGASTRODUODENOSCOPY  09/2007   solitary inlet patch (bx neg), antral erosions  . TUBAL LIGATION      There were no vitals filed for this  visit.  Subjective Assessment - 04/19/18 1031    Subjective  Pt states that she is having n/t today which she hasn't had in a long time and it is down to her elbow. She woke up with it like that.     Currently in Pain?  Yes    Pain Score  5     Pain Location  Neck    Pain Orientation  Mid    Pain Descriptors / Indicators  Tingling;Numbness   stiffness   Pain Type  Chronic pain    Pain Radiating Towards  n/t down to L elbow    Pain Onset  More than a month ago    Pain Frequency  Constant    Aggravating Factors   activity    Pain Relieving Factors  meds    Effect of Pain on Daily Activities  limits         [facility] PT Assessment - 04/19/18 0001      Assessment   Medical Diagnosis  cervical pain    Referring Provider (PT)  Annell GreeningMark Yates    Onset Date/Surgical Date  --   1992   Next MD Visit  04/22/2018    Prior Therapy  none      Observation/Other Assessments   Focus on Therapeutic Outcomes (  FOTO)   46% limitation   was 50     Strength   Cervical Extension  4/5   was 3-   Cervical - Right Side Bend  4/5   was 3-   Cervical - Left Side Bend  4/5   was 3-           [facility] Adult PT Treatment/Exercise - 04/19/18 0001      Neck Exercises: Supine   Other Supine Exercise  median nerve glides 3x10 reps  (symptoms centralized)      Manual Therapy   Manual Therapy  Soft tissue mobilization    Manual therapy comments  completed separate from all other skilled interventions    Soft tissue mobilization  prone bil UT, levator scap, upper thoracic paraspinals, cervical paraspinals and suboccipitals           PT Education - 04/19/18 1029    Education Details  reassessment findings    Person(s) Educated  Patient    Methods  Explanation;Demonstration    Comprehension  Verbalized understanding;Returned demonstration       PT Short Term Goals - 04/19/18 1033      PT SHORT TERM GOAL #1   Title  PT to be I in HEP to improve cervical and scapular strength to improve  tolerance of functional use of pt UE's.     Time  2    Period  Weeks    Status  Achieved      PT SHORT TERM GOAL #2   Title  PT to cervical strength to be 4/5 to allow pt pain to be decreased to no greater than a 6/10 to allow pt to be able to rest better.     Baseline  12/17: cervical strength 4/5; pain has been averaging about a 6/10 over the last 1-2 weeks    Time  2    Period  Weeks    Status  Achieved      PT SHORT TERM GOAL #3   Title  PT to be able to complete light activty, ie folding clothes, for 20 minutes without having increased pain.     Baseline  12/17: still having trouble with this     Time  2    Period  Weeks    Status  On-going        PT Long Term Goals - 04/19/18 1035      PT LONG TERM GOAL #1   Title  Pt cervical strength to be at least a 4+/5 to allow pt pain level to be decreased by no greater than a 4/10 to allow pt to be able to complete light housework tasks for 30 minutes or greater.     Baseline  12/17: 4/5 cervical MMT; pain has been averaging about a 6/10 over the last 1-2 weeks; still having difficulty completing light house work    Time  4    Period  Weeks    Status  On-going      PT LONG TERM GOAL #2   Title  PT to be able to stablilze her cervical spine while putting dishes and groceries up to allow pt to complete these tasks without experiencing increaed pain.     Baseline  12/17: no pain with this    Time  4    Period  Weeks    Status  Achieved            Plan - 04/19/18 1115    Clinical Impression Statement  PT reassessed pt's goals and outcome measures this date. Pt has made good progress towards goals as illustrated above. Her cervical strength has improved but it still slightly deficient and her overall pain has reduced to average about 6/10. Her main limitations that remain are her functional strength, endurance, and functional and OH tasks which are all limited due to pain. Pt needs continued skilled PT intervention in order to  continue to reduce pain and improve functional strength/endurance in order to maximize her QOL and function at home and in the community. Pt with n/t down LUE this date. PT assessed nerve glides and she was + for median nerve; performed nerve flossing and pt reported centralization of n/t in LUE. Ended with manual STM to upper trap, periscap and cervical mm and pt reporting reduced pain to 3.10 with almost complete resolution of n/t in LUE at EOS. Educated pt to perform self-median nerve glides at home when she feels this n/t and she verbalized understanding.    Rehab Potential  Fair    PT Frequency  2x / week    PT Duration  4 weeks    PT Treatment/Interventions  ADLs/Self Care Home Management;Patient/family education;Manual techniques;Therapeutic activities;Therapeutic exercise;Traction    PT Next Visit Plan  continue with postural strengthening and manual for soft tissue restrictions; nerve glides PRN for radicular sx    PT Home Exercise Plan  isometrics, scapular retraction, ; 03/28/2018 - upper trap, levator stretches    Consulted and Agree with Plan of Care  Patient       Patient will benefit from skilled therapeutic intervention in order to improve the following deficits and impairments:  Decreased activity tolerance, Decreased range of motion, Decreased strength, Increased muscle spasms, Impaired perceived functional ability, Impaired UE functional use, Pain  Visit Diagnosis: Radiculopathy, cervical region - Plan: PT plan of care cert/re-cert     Problem List Patient Active Problem List   Diagnosis Date Noted  . Neck pain 03/17/2018  . Chronic bilateral low back pain without sciatica 03/17/2018  . Encounter for screening colonoscopy 10/27/2017  . Failed conscious sedation during procedure 10/27/2017  . Chronic diarrhea 01/09/2015  . GERD (gastroesophageal reflux disease) 09/30/2010  . Constipation 09/30/2010       Jac Canavan PT, DPT  [facility] [address] [place], [place], [address] Phone: [phone]   Fax:  [phone]  Name: Heather Davidson MRN: [id number] Date of Birth: July 15, 1960

## 2018-04-21 ENCOUNTER — Encounter (INDEPENDENT_AMBULATORY_CARE_PROVIDER_SITE_OTHER): Payer: Self-pay | Admitting: Orthopaedic Surgery

## 2018-04-21 ENCOUNTER — Ambulatory Visit (INDEPENDENT_AMBULATORY_CARE_PROVIDER_SITE_OTHER): Payer: BLUE CROSS/BLUE SHIELD | Admitting: Orthopaedic Surgery

## 2018-04-21 VITALS — BP 117/83 | HR 93 | Ht 66.0 in | Wt 170.0 lb

## 2018-04-21 DIAGNOSIS — G8929 Other chronic pain: Secondary | ICD-10-CM

## 2018-04-21 DIAGNOSIS — M545 Low back pain: Secondary | ICD-10-CM

## 2018-04-21 DIAGNOSIS — M542 Cervicalgia: Secondary | ICD-10-CM

## 2018-04-21 DIAGNOSIS — M47812 Spondylosis without myelopathy or radiculopathy, cervical region: Secondary | ICD-10-CM | POA: Insufficient documentation

## 2018-04-21 NOTE — Progress Notes (Signed)
Office Visit Note   Patient: Heather Davidson           Date of Birth: 06/08/1960           MRN: [id number] Visit Date: 04/21/2018              Requested by: Carylon PerchesFagan, Roy, MD [address] [place], [place] [address] PCP: Carylon PerchesFagan, Roy, MD   Assessment & Plan: Visit Diagnoses:  1. Chronic bilateral low back pain without sciatica   2. Neck pain   3. Spondylosis without myelopathy or radiculopathy, cervical region     Plan: Patient is been through physical therapy with some improvement in her neck symptoms.  She continues to have neck pain left shoulder pain more than right with pain that radiates into her left arm.  This is been going on chronically previous MRI scan cervical spine 2012 showed some borderline central stenosis at C6-7 and foraminal stenosis at C5-6.  She has had prednisone pack physical therapy anti-inflammatories and persistent symptoms for greater than 2 years.  We will proceed with cervical MRI scan for evaluation and office follow-up after scan for review.  Follow-Up Instructions: No follow-ups on file.   Orders:  Orders Placed This Encounter  Procedures  . MR Cervical Spine w/o contrast  . Ambulatory referral to Physical Therapy   No orders of the defined types were placed in this encounter.     Procedures: No procedures performed   Clinical Data: No additional findings.   Subjective: Chief Complaint  Patient presents with  . Neck - Follow-up  . Lower Back - Follow-up    HPI 57 year old female returns with chronic neck greater than low back pain.  She states she has had neck pain for greater than 30 years.  She is been through epidurals both cervical and lumbar at least total of 5 epidurals.  She is been going to physical therapy since seen on 03/17/2018 with some improvement in her neck pain but she has persistent neck symptoms.  Previous MRI showed some borderline central stenosis and also some foraminal stenosis with involvement at C5-6 C6-7.   Currently not on any narcotic medication.  Review of Systems 14 point review of systems updated unchanged from 03/17/2018 office visit.   Objective: Vital Signs: BP 117/83   Pulse 93   Ht 5\' 6"  (1.676 m)   Wt 170 lb (77.1 kg)   BMI 27.44 kg/m   Physical Exam Constitutional:      Appearance: She is well-developed.  HENT:     Head: Normocephalic.     Right Ear: External ear normal.     Left Ear: External ear normal.  Eyes:     Pupils: Pupils are equal, round, and reactive to light.  Neck:     Thyroid: No thyromegaly.     Trachea: No tracheal deviation.  Cardiovascular:     Rate and Rhythm: Normal rate.  Pulmonary:     Effort: Pulmonary effort is normal.  Abdominal:     Palpations: Abdomen is soft.  Skin:    General: Skin is warm and dry.     Comments: Healed right and left carpal tunnel incisions.  Neurological:     Mental Status: She is alert and oriented to person, place, and time.  Psychiatric:        Behavior: Behavior normal.     Ortho Exam bilateral brachial plexus tenderness worse on the left than right.  Reflexes are 2+ and symmetrical.  Negative impingement right left  shoulder.  No thenar or hyperthenar atrophy.  Well-healed carpal tunnel incisions right and left.  No lower extremity clonus normal heel toe gait.  No cellulitis no distal edema.  Knees reach full extension.  No supraclavicular lymphadenopathy.  Thyroid is normal.  Specialty Comments:  No specialty comments available.  Imaging: No results found.   PMFS History: Patient Active Problem List   Diagnosis Date Noted  . Neck pain 03/17/2018  . Chronic bilateral low back pain without sciatica 03/17/2018  . Encounter for screening colonoscopy 10/27/2017  . Failed conscious sedation during procedure 10/27/2017  . Chronic diarrhea 01/09/2015  . GERD (gastroesophageal reflux disease) 09/30/2010  . Constipation 09/30/2010   Past Medical History:  Diagnosis Date  . Anxiety   . Clostridium  difficile colitis 12/2010  . Depression   . GERD (gastroesophageal reflux disease)   . HTN (hypertension)    physician stopped BP med  . Seasonal allergies     Family History  Problem Relation Age of Onset  . Lung cancer Father        asbestosis  . COPD Mother        Died 5695  . Colon cancer Neg Hx     Past Surgical History:  Procedure Laterality Date  . ABDOMINAL HYSTERECTOMY    . CARPAL TUNNEL RELEASE Right 01/07/2018   Procedure: RIGHT CARPAL TUNNEL RELEASE;  Surgeon: Cindee SaltKuzma, Gary, MD;  Location: [facility];  Service: Orthopedics;  Laterality: Right;  FAB  . CERVICAL CONIZATION W/BX     for precervical cancer in remote past  . CESAREAN SECTION    . COLONOSCOPY  09/2007   anal papilla, normal colon and terminal ileum  . COLONOSCOPY WITH PROPOFOL N/A 02/10/2018   Procedure: COLONOSCOPY WITH PROPOFOL;  Surgeon: Corbin Adeourk, Robert M, MD;  [facility];  Service: Endoscopy;  Laterality: N/A;  1:30pm  . complete hysterectomy    . ESOPHAGOGASTRODUODENOSCOPY  09/2007   solitary inlet patch (bx neg), antral erosions  . TUBAL LIGATION     Social History   Occupational History    Employer: [employer]  Tobacco Use  . Smoking status: Current Every Day Smoker    Packs/day: 1.00    Years: 25.00    Pack years: 25.00    Types: Cigarettes  . Smokeless tobacco: Never Used  Substance and Sexual Activity  . Alcohol use: Yes    Alcohol/week: 0.0 standard drinks    Comment: 4 glass of red wine a night   . Drug use: No  . Sexual activity: Not on file

## 2018-04-26 ENCOUNTER — Ambulatory Visit (HOSPITAL_COMMUNITY): Payer: BLUE CROSS/BLUE SHIELD

## 2018-04-26 ENCOUNTER — Encounter (HOSPITAL_COMMUNITY): Payer: Self-pay

## 2018-04-26 ENCOUNTER — Other Ambulatory Visit: Payer: Self-pay

## 2018-04-26 DIAGNOSIS — M5412 Radiculopathy, cervical region: Secondary | ICD-10-CM

## 2018-04-26 DIAGNOSIS — M6281 Muscle weakness (generalized): Secondary | ICD-10-CM | POA: Diagnosis not present

## 2018-04-26 DIAGNOSIS — M545 Low back pain, unspecified: Secondary | ICD-10-CM

## 2018-04-26 DIAGNOSIS — G8929 Other chronic pain: Secondary | ICD-10-CM | POA: Diagnosis not present

## 2018-04-26 DIAGNOSIS — R29898 Other symptoms and signs involving the musculoskeletal system: Secondary | ICD-10-CM

## 2018-04-26 NOTE — Therapy (Signed)
[facility] [address] [place], [place], [address] Phone: [phone]   Fax:  [phone]  Physical Therapy Re-Evaluation  Patient Details  Name: Heather Davidson MRN: [id number] Date of Birth: Jan 25, 1961 Referring Provider (PT): Annell Greening, MD   Encounter Date: 04/26/2018  PT End of Session - 04/26/18 1133    Visit Number  8    Number of Visits  16    Date for PT Re-Evaluation  05/17/18    Authorization Type  [employer]    Authorization - Visit Number  8    Authorization - Number of Visits  30    PT Start Time  [phone]    PT Stop Time  1025    PT Time Calculation (min)  38 min    Activity Tolerance  Patient tolerated treatment well    Behavior During Therapy  [facility] for tasks assessed/performed       Past Medical History:  Diagnosis Date  . Anxiety   . Clostridium difficile colitis 12/2010  . Depression   . GERD (gastroesophageal reflux disease)   . HTN (hypertension)    physician stopped BP med  . Seasonal allergies     Past Surgical History:  Procedure Laterality Date  . ABDOMINAL HYSTERECTOMY    . CARPAL TUNNEL RELEASE Right 01/07/2018   Procedure: RIGHT CARPAL TUNNEL RELEASE;  Surgeon: Cindee Salt, MD;  Location: [facility];  Service: Orthopedics;  Laterality: Right;  FAB  . CERVICAL CONIZATION W/BX     for precervical cancer in remote past  . CESAREAN SECTION    . COLONOSCOPY  09/2007   anal papilla, normal colon and terminal ileum  . COLONOSCOPY WITH PROPOFOL N/A 02/10/2018   Procedure: COLONOSCOPY WITH PROPOFOL;  Surgeon: Corbin Ade, MD;  [facility];  Service: Endoscopy;  Laterality: N/A;  1:30pm  . complete hysterectomy    . ESOPHAGOGASTRODUODENOSCOPY  09/2007   solitary inlet patch (bx neg), antral erosions  . TUBAL LIGATION      There were no vitals filed for this visit.   Subjective Assessment - 04/26/18 0950    Subjective  Pt presents with referral for LBP from Dr. Ophelia Charter. She  states that 2 months ago, it "totally went out on me." She states that she had to walk with a cane it was so bad. She states that the pain is located at her lower back and down towards her waist but it does not go down her legs; it might go into her hips but no reports of radicular symptoms. She reports her pain as a dull ache. She is not sure what caused her back pain to flare up 2 months ago, but she has had the LBP for years of gradual onset. She has the most difficulty with performing HH chores and standing any length of time. No real issues with walking as it relieves her pain. Bending and lifting are also difficult for her.     Limitations  Lifting;Standing;House hold activities    How long can you sit comfortably?  30 minutes with support    How long can you stand comfortably?  20 mins    How long can you walk comfortably?  a good 30 mins    Patient Stated Goals  be able to stand up and cook a big meal    Currently in Pain?  Yes    Pain Score  5     Pain Location  Back  Pain Orientation  Lower    Pain Descriptors / Indicators  Aching;Dull    Pain Type  Chronic pain    Pain Onset  More than a month ago    Pain Frequency  Constant    Aggravating Factors   bending, lifting, standing, HH chores    Pain Relieving Factors  walking, stretching, heat and ice    Effect of Pain on Daily Activities  limits         [facility] PT Assessment - 04/26/18 0001      Assessment   Medical Diagnosis  LBP    Referring Provider (PT)  Annell GreeningMark Yates, MD    Next MD Visit  after MRI which is on 05/03/18    Prior Therapy  PT for neck      Observation/Other Assessments   Focus on Therapeutic Outcomes (FOTO)   68% limited       AROM   AROM Assessment Site  Lumbar    Lumbar Flexion  25% limited, in lordosis; pain on return to upright position; RFIS x10 increased    Lumbar Extension  WFL; REISx10 reduced pain back to baseline    Lumbar - Right Side Bend  WNL    Lumbar - Left Side Bend  WNL    Lumbar - Right  Rotation  25% limited    Lumbar - Left Rotation  25% limited      Strength   Strength Assessment Site  Hip;Knee;Ankle    Right Hip Flexion  5/5    Right Hip Extension  4-/5    Right Hip ABduction  4-/5    Left Hip Flexion  5/5    Left Hip Extension  4-/5    Left Hip ABduction  4/5    Right Knee Flexion  4+/5    Right Knee Extension  5/5    Left Knee Flexion  4+/5    Left Knee Extension  5/5    Right Ankle Dorsiflexion  5/5    Left Ankle Dorsiflexion  5/5      Flexibility   Soft Tissue Assessment /Muscle Length  yes    Hamstrings  WNL BLE    Quadriceps  +Ely's BLE, no LBP      Palpation   Spinal mobility  hypomobile throughout thoracic and lumbar spine    Palpation comment  increased soft tissue restrictions and tenderness to palpation throughout lumbar paraspinals, distal lats, and gluteal mm; recreation throughout all       Balance   Balance Assessed  Yes      Static Standing Balance   Static Standing - Balance Support  No upper extremity supported    Static Standing Balance -  Activities   Single Leg Stance - Right Leg;Single Leg Stance - Left Leg    Static Standing - Comment/# of Minutes  R: 9sec, mod unsteadiness; L: 21sec, mod unsteadiness      Standardized Balance Assessment   Standardized Balance Assessment  Five Times Sit to Stand    Five times sit to stand comments   13.8sec, no UE          Objective measurements completed on examination: See above findings.         PT Education - 04/26/18 1028    Education Details  exam findings, HEP, POC; will focus on LBP going forward but can still address cervical spine if absolutely needed    Person(s) Educated  Patient    Methods  Explanation;Demonstration;Handout    Comprehension  Verbalized understanding  PT Short Term Goals - 04/26/18 1137      PT SHORT TERM GOAL #1   Title  PT to be I in HEP to improve cervical and scapular strength to improve tolerance of functional use of pt UE's.     Time  2     Period  Weeks    Status  Achieved      PT SHORT TERM GOAL #2   Title  PT to cervical strength to be 4/5 to allow pt pain to be decreased to no greater than a 6/10 to allow pt to be able to rest better.     Baseline  12/17: cervical strength 4/5; pain has been averaging about a 6/10 over the last 1-2 weeks    Time  2    Period  Weeks    Status  Achieved      PT SHORT TERM GOAL #3   Title  PT to be able to complete light activty, ie folding clothes, for 20 minutes without having increased pain.     Baseline  12/17: still having trouble with this     Time  2    Period  Weeks    Status  On-going      PT SHORT TERM GOAL #4   Title  Pt will have improved lumbar ROM to [facility] and without pain in order to demo reduced soft tissue restrictions and improved joint mobility.     Time  2    Period  Weeks    Status  New    Target Date  05/10/18      PT SHORT TERM GOAL #5   Title  Pt will have improved proximal hip strength to 4/5 in order to reduce LBP.    Time  2    Period  Weeks    Status  New      Additional Short Term Goals   Additional Short Term Goals  Yes      PT SHORT TERM GOAL #6   Title  Pt will report being able to cook for 45 mins without increases in LBP to demo improved functional strength and overall function at home.     Time  2    Period  Weeks    Status  New      PT SHORT TERM GOAL #7   Title  Pt will report being able to sweep, mop, and vacuum for 30 mins before needing a rest break to demo improved core and functional strength and tolerance to flexion ROM.     Time  2    Period  Weeks    Status  New        PT Long Term Goals - 04/26/18 1138      PT LONG TERM GOAL #1   Title  Pt cervical strength to be at least a 4+/5 to allow pt pain level to be decreased by no greater than a 4/10 to allow pt to be able to complete light housework tasks for 30 minutes or greater.     Baseline  12/17: 4/5 cervical MMT; pain has been averaging about a 6/10 over the last 1-2  weeks; still having difficulty completing light house work    Time  4    Period  Weeks    Status  On-going      PT LONG TERM GOAL #2   Title  PT to be able to stablilze her cervical spine while putting dishes and groceries up to allow  pt to complete these tasks without experiencing increaed pain.     Baseline  12/17: no pain with this    Time  4    Period  Weeks    Status  Achieved      PT LONG TERM GOAL #3   Title  Pt will have improved proximal hip strength to 4+/5 in order to further reduce LBP and maximize her ability to complete HH chores with greater ease.    Time  4    Period  Weeks    Status  New    Target Date  05/24/18      PT LONG TERM GOAL #4   Title  Pt will report being able to stand and cook for 1+hours without increases in LBP or need for rest break to further demo improved core and functional strength and demo improved tolerance to standing.    Time  4    Period  Weeks    Status  New      PT LONG TERM GOAL #5   Title  Pt will report being able to sweep, mop, and vacuum for 1 hour before needing a break and without increases in LBP to further demo improved function and maximize her ability to complete HH chores with greater ease.     Time  4    Period  Weeks    Status  New      Additional Long Term Goals   Additional Long Term Goals  Yes      PT LONG TERM GOAL #6   Title  Pt will report being able to lift her grandchild up without increases and LBP and with proper form to demo improved core and functional strength and reduce her risk for reinjury with lifting.    Time  4    Period  Weeks    Status  New             Plan - 04/26/18 1134    Clinical Impression Statement  Pt is pleasant 57YO F who presents to OPPT with referral for LBP from Dr. Annell GreeningMark Yates. She is currently being treated at this clinic for cervical radiculopathy which Dr. Ophelia CharterYates referred her for as well. She states that she can manage her neck relatively well at the moment and would like to  focus on her back for a while. Pt presents with deficits in core, BLE, and functional strength as well as increased joint hypomobility and soft tissue restrictions throughout lumbar and hip mm. Pt also has deficits in SLS and functional strength AEB SLS time (R more deficient than L) and 5xSTS time. Pt needs skilled PT intervention in order to reduce her LBP and maximize function at home and in the community.     Clinical Presentation  Stable    Clinical Decision Making  Low    Rehab Potential  Fair    PT Frequency  2x / week    PT Duration  4 weeks    PT Treatment/Interventions  ADLs/Self Care Home Management;Patient/family education;Manual techniques;Therapeutic activities;Therapeutic exercise;Traction;Aquatic Therapy;Cryotherapy;Electrical Stimulation;Moist Heat;Gait training;Stair training;Functional mobility training;Passive range of motion;Dry needling;Taping;Spinal Manipulations;Joint Manipulations    PT Next Visit Plan  review goals for LBP POC; focus on LBP going forward; begin core, BLE, funcitonal strengthening, general stretching and mobility work, manual for STM    PT Home Exercise Plan  isometrics, scapular retraction, ; 03/28/2018 - upper trap, levator stretches; 12/24: LTR, supine figure 4 (pulling to opposite shoulder), SK[facility], [facility]  Consulted and Agree with Plan of Care  Patient       Patient will benefit from skilled therapeutic intervention in order to improve the following deficits and impairments:  Decreased activity tolerance, Decreased range of motion, Decreased strength, Increased muscle spasms, Impaired perceived functional ability, Impaired UE functional use, Pain, Abnormal gait, Decreased endurance, Hypomobility, Increased fascial restricitons, Postural dysfunction, Improper body mechanics, Decreased balance  Visit Diagnosis: Chronic bilateral low back pain without sciatica - Plan: PT plan of care cert/re-cert  Muscle weakness (generalized) - Plan: PT plan of care  cert/re-cert  Other symptoms and signs involving the musculoskeletal system - Plan: PT plan of care cert/re-cert  Radiculopathy, cervical region - Plan: PT plan of care cert/re-cert     Problem List Patient Active Problem List   Diagnosis Date Noted  . Spondylosis without myelopathy or radiculopathy, cervical region 04/21/2018  . Neck pain 03/17/2018  . Chronic bilateral low back pain without sciatica 03/17/2018  . Encounter for screening colonoscopy 10/27/2017  . Failed conscious sedation during procedure 10/27/2017  . Chronic diarrhea 01/09/2015  . GERD (gastroesophageal reflux disease) 09/30/2010  . Constipation 09/30/2010        Jac Canavan PT, DPT  [facility] [address] [place], [place], [address] Phone: [phone]   Fax:  [phone]  Name: Heather Davidson MRN: [id number] Date of Birth: [birth year]

## 2018-04-29 DIAGNOSIS — E785 Hyperlipidemia, unspecified: Secondary | ICD-10-CM | POA: Diagnosis not present

## 2018-04-29 DIAGNOSIS — Z79899 Other long term (current) drug therapy: Secondary | ICD-10-CM | POA: Diagnosis not present

## 2018-05-03 ENCOUNTER — Ambulatory Visit (HOSPITAL_COMMUNITY)
Admission: RE | Admit: 2018-05-03 | Discharge: 2018-05-03 | Disposition: A | Discharge status: Home / Self Care | Payer: BLUE CROSS/BLUE SHIELD | Source: Ambulatory Visit | Attending: Orthopaedic Surgery | Admitting: Orthopaedic Surgery

## 2018-05-03 DIAGNOSIS — M542 Cervicalgia: Secondary | ICD-10-CM | POA: Insufficient documentation

## 2018-05-09 ENCOUNTER — Telehealth (HOSPITAL_COMMUNITY): Payer: Self-pay | Admitting: Internal Medicine

## 2018-05-09 NOTE — Telephone Encounter (Signed)
05/09/18 pt called to change appt if possible on 1/9 and when I offered afternoon appts she said she couldn't do... she said she would just come one day this week

## 2018-05-10 ENCOUNTER — Ambulatory Visit (HOSPITAL_COMMUNITY): Payer: BLUE CROSS/BLUE SHIELD | Attending: Orthopaedic Surgery | Admitting: Physical Therapy

## 2018-05-10 DIAGNOSIS — M545 Low back pain, unspecified: Secondary | ICD-10-CM

## 2018-05-10 DIAGNOSIS — M5412 Radiculopathy, cervical region: Secondary | ICD-10-CM | POA: Diagnosis not present

## 2018-05-10 DIAGNOSIS — G8929 Other chronic pain: Secondary | ICD-10-CM | POA: Insufficient documentation

## 2018-05-10 DIAGNOSIS — M6281 Muscle weakness (generalized): Secondary | ICD-10-CM | POA: Diagnosis not present

## 2018-05-10 DIAGNOSIS — R29898 Other symptoms and signs involving the musculoskeletal system: Secondary | ICD-10-CM | POA: Insufficient documentation

## 2018-05-10 NOTE — Therapy (Signed)
[facility] [address] [place], [place], [address] Phone: [phone]   Fax:  [phone]  Physical Therapy Treatment  Patient Details  Name: Heather Davidson MRN: [id number] Date of Birth: 1961-01-08 Referring Provider (PT): Annell Greening, MD   Encounter Date: 05/10/2018  PT End of Session - 05/10/18 1116    Visit Number  9   Progress note completed 8th visit   Number of Visits  16    Date for PT Re-Evaluation  05/24/18    Authorization Type  BC[employer]    Authorization - Visit Number  9    Authorization - Number of Visits  18    PT Start Time  1045    PT Stop Time  1115    PT Time Calculation (min)  30 min    Activity Tolerance  Patient tolerated treatment well    Behavior During Therapy  [facility] for tasks assessed/performed       Past Medical History:  Diagnosis Date  . Anxiety   . Clostridium difficile colitis 12/2010  . Depression   . GERD (gastroesophageal reflux disease)   . HTN (hypertension)    physician stopped BP med  . Seasonal allergies     Past Surgical History:  Procedure Laterality Date  . ABDOMINAL HYSTERECTOMY    . CARPAL TUNNEL RELEASE Right 01/07/2018   Procedure: RIGHT CARPAL TUNNEL RELEASE;  Surgeon: Cindee Salt, MD;  Location: [facility];  Service: Orthopedics;  Laterality: Right;  FAB  . CERVICAL CONIZATION W/BX     for precervical cancer in remote past  . CESAREAN SECTION    . COLONOSCOPY  09/2007   anal papilla, normal colon and terminal ileum  . COLONOSCOPY WITH PROPOFOL N/A 02/10/2018   Procedure: COLONOSCOPY WITH PROPOFOL;  Surgeon: Corbin Ade, MD;  [facility];  Service: Endoscopy;  Laterality: N/A;  1:30pm  . complete hysterectomy    . ESOPHAGOGASTRODUODENOSCOPY  09/2007   solitary inlet patch (bx neg), antral erosions  . TUBAL LIGATION      There were no vitals filed for this visit.  Subjective Assessment - 05/10/18 1107    Subjective  Pt returns today with continued  symptoms into lumbar region with more tightness than shooting pains.  STates she got her MRI results of her cervical region and does have herniation; has return appt with orthopedic for her neck on Thursday.     Currently in Pain?  Yes    Pain Score  6     Pain Location  Back    Pain Orientation  Lower    Pain Descriptors / Indicators  Aching;Tightness    Pain Type  Chronic pain                       [facility] Adult PT Treatment/Exercise - 05/10/18 0001      Lumbar Exercises: Standing   Heel Raises  10 reps    Functional Squats  10 reps    Other Standing Lumbar Exercises  hip extension, hip abduction 10 reps each      Lumbar Exercises: Supine   Ab Set  10 reps;4 seconds    Clam  10 reps    Bridge  10 reps    Straight Leg Raise  10 reps      Lumbar Exercises: Sidelying   Hip Abduction  Both;10 reps      Lumbar Exercises: Prone   Straight Leg Raise  10 reps  PT Education - 05/10/18 1116    Education Details  reviewed evaluation goals and POC moving forward.  reviewed lumbar excursions    Person(s) Educated  Patient    Methods  Explanation    Comprehension  Verbalized understanding       PT Short Term Goals - 05/10/18 1110      PT SHORT TERM GOAL #1   Title  PT to be I in HEP to improve cervical and scapular strength to improve tolerance of functional use of pt UE's.     Time  2    Period  Weeks    Status  Achieved      PT SHORT TERM GOAL #2   Title  PT to cervical strength to be 4/5 to allow pt pain to be decreased to no greater than a 6/10 to allow pt to be able to rest better.     Baseline  12/17: cervical strength 4/5; pain has been averaging about a 6/10 over the last 1-2 weeks    Time  2    Period  Weeks    Status  Achieved      PT SHORT TERM GOAL #3   Title  PT to be able to complete light activty, ie folding clothes, for 20 minutes without having increased pain.     Baseline  12/17: still having trouble with this     Time  2     Period  Weeks    Status  On-going      PT SHORT TERM GOAL #4   Title  Pt will have improved lumbar [facility] to [facility] and without pain in order to demo reduced soft tissue restrictions and improved joint mobility.     Time  2    Period  Weeks    Status  On-going      PT SHORT TERM GOAL #5   Title  Pt will have improved proximal hip strength to 4/5 in order to reduce LBP.    Time  2    Period  Weeks    Status  On-going      PT SHORT TERM GOAL #6   Title  Pt will report being able to cook for 45 mins without increases in LBP to demo improved functional strength and overall function at home.     Time  2    Period  Weeks    Status  New      PT SHORT TERM GOAL #7   Title  Pt will report being able to sweep, mop, and vacuum for 30 mins before needing a rest break to demo improved core and functional strength and tolerance to flexion ROM.     Time  2    Period  Weeks    Status  New        PT Long Term Goals - 05/10/18 1111      PT LONG TERM GOAL #1   Title  Pt cervical strength to be at least a 4+/5 to allow pt pain level to be decreased by no greater than a 4/10 to allow pt to be able to complete light housework tasks for 30 minutes or greater.     Baseline  12/17: 4/5 cervical MMT; pain has been averaging about a 6/10 over the last 1-2 weeks; still having difficulty completing light house work    Time  4    Period  Weeks    Status  On-going      PT LONG TERM  GOAL #2   Title  PT to be able to stablilze her cervical spine while putting dishes and groceries up to allow pt to complete these tasks without experiencing increaed pain.     Baseline  12/17: no pain with this    Time  4    Period  Weeks    Status  Achieved      PT LONG TERM GOAL #3   Title  Pt will have improved proximal hip strength to 4+/5 in order to further reduce LBP and maximize her ability to complete HH chores with greater ease.    Time  4    Period  Weeks    Status  On-going      PT LONG TERM GOAL #4   Title   Pt will report being able to stand and cook for 1+hours without increases in LBP or need for rest break to further demo improved core and functional strength and demo improved tolerance to standing.    Time  4    Period  Weeks    Status  On-going      PT LONG TERM GOAL #5   Title  Pt will report being able to sweep, mop, and vacuum for 1 hour before needing a break and without increases in LBP to further demo improved function and maximize her ability to complete HH chores with greater ease.     Time  4    Period  Weeks    Status  On-going      PT LONG TERM GOAL #6   Title  Pt will report being able to lift her grandchild up without increases and LBP and with proper form to demo improved core and functional strength and reduce her risk for reinjury with lifting.    Time  4    Period  Weeks    Status  On-going            Plan - 05/10/18 1117    Clinical Impression Statement  Reviewed goals for shifting to LB treatment and POC moving forward.  began core stabilization and LE strengthening exercises this session.  PT able to complete with cues for stabilization (clams) and no complaints with activities.  Pt got her appt time confused thinking it started at 10:45 rather than 10:30 so unable to get full session today.      Rehab Potential  Fair    PT Frequency  2x / week    PT Duration  4 weeks    PT Treatment/Interventions  ADLs/Self Care Home Management;Patient/family education;Manual techniques;Therapeutic activities;Therapeutic exercise;Traction;Aquatic Therapy;Cryotherapy;Electrical Stimulation;Moist Heat;Gait training;Stair training;Functional mobility training;Passive range of motion;Dry needling;Taping;Spinal Manipulations;Joint Manipulations    PT Next Visit Plan  Progress core, BLE, functional strengthening, general stretching and mobility work, manual for [employer] as needed.  Next session review lumbar excursions (all 3 motions) and begin lunges and hamstring stretches.  Give  printouts to update HEP.    PT Home Exercise Plan  isometrics, scapular retraction, ; 03/28/2018 - upper trap, levator stretches; 12/24: LTR, supine figure 4 (pulling to opposite shoulder), SKTC, DTKC    Consulted and Agree with Plan of Care  Patient       Patient will benefit from skilled therapeutic intervention in order to improve the following deficits and impairments:  Decreased activity tolerance, Decreased range of motion, Decreased strength, Increased muscle spasms, Impaired perceived functional ability, Impaired UE functional use, Pain, Abnormal gait, Decreased endurance, Hypomobility, Increased fascial restricitons, Postural dysfunction, Improper body mechanics, Decreased balance  Visit Diagnosis: Chronic bilateral low back pain without sciatica  Muscle weakness (generalized)     Problem List Patient Active Problem List   Diagnosis Date Noted  . Spondylosis without myelopathy or radiculopathy, cervical region 04/21/2018  . Neck pain 03/17/2018  . Chronic bilateral low back pain without sciatica 03/17/2018  . Encounter for screening colonoscopy 10/27/2017  . Failed conscious sedation during procedure 10/27/2017  . Chronic diarrhea 01/09/2015  . GERD (gastroesophageal reflux disease) 09/30/2010  . Constipation 09/30/2010   Lurena NidaAmy B Johanna Matto, PTA/CLT [phone]  Lurena NidaFrazier, Brighid Koch B 05/10/2018, 11:21 AM  [facility] [address] [place], [place], [address] Phone: [phone]   Fax:  [phone]  Name: Heather Davidson MRN: [id number] Date of Birth: 12/14/1960

## 2018-05-12 ENCOUNTER — Encounter (INDEPENDENT_AMBULATORY_CARE_PROVIDER_SITE_OTHER): Payer: Self-pay | Admitting: Orthopaedic Surgery

## 2018-05-12 ENCOUNTER — Ambulatory Visit (INDEPENDENT_AMBULATORY_CARE_PROVIDER_SITE_OTHER): Payer: BLUE CROSS/BLUE SHIELD | Admitting: Orthopaedic Surgery

## 2018-05-12 ENCOUNTER — Encounter (HOSPITAL_COMMUNITY): Payer: BLUE CROSS/BLUE SHIELD | Admitting: Physical Therapy

## 2018-05-12 VITALS — BP 112/65 | HR 106 | Ht 66.0 in | Wt 170.0 lb

## 2018-05-12 DIAGNOSIS — M545 Low back pain: Secondary | ICD-10-CM

## 2018-05-12 DIAGNOSIS — G8929 Other chronic pain: Secondary | ICD-10-CM | POA: Diagnosis not present

## 2018-05-12 DIAGNOSIS — M47812 Spondylosis without myelopathy or radiculopathy, cervical region: Secondary | ICD-10-CM | POA: Diagnosis not present

## 2018-05-12 NOTE — Progress Notes (Signed)
Office Visit Note   Patient: Heather Davidson           Date of Birth: July 13, 1960           MRN: [id number] Visit Date: 05/12/2018              Requested by: Carylon Perches, MD [address] [place], [place] [address] PCP: Carylon Perches, MD   Assessment & Plan: Visit Diagnoses:  1. Chronic bilateral low back pain without sciatica   2. Spondylosis without myelopathy or radiculopathy, cervical region     Plan: We will continue therapy for the lumbar spine.  Currently her back symptoms are worse than her cervical symptoms.  Cervical spine does show progression of uncovertebral disease and foraminal stenosis bilaterally at C5-6 with some progressive mild stenosis at C6-7.  If her lumbar symptoms persist we may need to consider reimaging since last MRI scan lumbar spine was 2014.  Currently patient has no radiculopathy nor myelopathy symptoms.  Follow-Up Instructions: Return if symptoms worsen or fail to improve.   Orders:  No orders of the defined types were placed in this encounter.  No orders of the defined types were placed in this encounter.     Procedures: No procedures performed   Clinical Data: No additional findings.   Subjective: Chief Complaint  Patient presents with  . Neck - Pain, Follow-up    MRI Cervical Spine Review    HPI 58 year old female returns with chronic neck pain symptoms and has had an MRI.  She states since her MRI her neck is actually been better she is having increasing back pain leg pain.  She is gone to physical therapy for neck use traction anti-inflammatories prednisone Dosepak.  She is had a total of 5 epidurals cervical and lumbar.  Currently back pain is worse when she stands she gets some relief with supine position.  Pain radiates into her buttocks and into her thigh.  She has difficulty getting into an upright position.  Cervical and lumbar MRI scan reports and images were reviewed with patient today.  She denies associated bowel or  bladder symptoms no fever or chills.  Review of Systems 14 point update obtained and reviewed unchanged from 03/17/2018 other than as mentioned in HPI.  Of note his chronic low back pain and chronic neck pain.   Objective: Vital Signs: BP 112/65   Pulse (!) 106   Ht 5\' 6"  (1.676 m)   Wt 170 lb (77.1 kg)   BMI 27.44 kg/m   Physical Exam Constitutional:      Appearance: She is well-developed.  HENT:     Head: Normocephalic.     Right Ear: External ear normal.     Left Ear: External ear normal.  Eyes:     Pupils: Pupils are equal, round, and reactive to light.  Neck:     Thyroid: No thyromegaly.     Trachea: No tracheal deviation.  Cardiovascular:     Rate and Rhythm: Normal rate.  Pulmonary:     Effort: Pulmonary effort is normal.  Abdominal:     Palpations: Abdomen is soft.  Skin:    General: Skin is warm and dry.  Neurological:     Mental Status: She is alert and oriented to person, place, and time.  Psychiatric:        Behavior: Behavior normal.     Ortho Exam patient has brachial plexus tenderness mild on the right moderate on the left.  Upper extremity reflexes are  2+.  Good shoulder range of motion without impingement right or left.  No isolated motor weakness upper extremities.  She has increased discomfort with cervical compression some relief with distraction.  Tenderness over the lumbar spine negative logroll to the hips.  There is sciatic notch tenderness both right and left.  Good hip flexion quad strength.  Anterior tib gastrocsoleus heel and toe walking is painful in her lower back but she is able to accomplish this without evidence of unilateral weakness. Specialty Comments:  No specialty comments available.  Imaging: CLINICAL DATA:  Neck pain extending into the upper extremities bilaterally for 31 years. Progressive upper extremity pain, left greater than right.  EXAM: MRI CERVICAL SPINE WITHOUT CONTRAST  TECHNIQUE: Multiplanar, multisequence MR  imaging of the cervical spine was performed. No intravenous contrast was administered.  COMPARISON:  MRI of cervical spine 11/10/2010  FINDINGS: Alignment: AP alignment is anatomic. There is some straightening of the normal cervical lordosis.  Vertebrae: Mild endplate marrow changes are present at C6-7. Marrow signal and vertebral body heights are otherwise normal.  Cord: Normal signal is present in the cervical and upper thoracic spinal cord to the lowest imaged level, T1-2.  Posterior Fossa, vertebral arteries, paraspinal tissues: Craniocervical junction is normal. Flow is present in the major intracranial arteries.  Disc levels:  C2-3: Asymmetric right-sided facet hypertrophy is present. There is no significant stenosis.  C3-4: Asymmetric right-sided facet hypertrophy is present. There is no significant stenosis.  C4-5: Negative.  C5-6: Progressive uncovertebral disease contributes to mild bilateral foraminal stenosis, right greater than left. The central canal is patent.  C6-7: A rightward disc osteophyte complex partially effaces the ventral CSF. Foramina are patent.  C7-T1: Negative.  IMPRESSION: 1. Progressive uncovertebral disease and foraminal stenosis bilaterally at C5-6, right greater than left. 2. Progressive mild central canal stenosis at C6-7. 3. Progressive asymmetric right-sided facet arthropathy at C2-3 and C3-4 without significant stenosis at these levels.   Electronically Signed   By: Marin Robertshristopher  Mattern M.D.   On: 05/03/2018 08:41  Study Result   EXAM: MRI LUMBAR SPINE WITHOUT CONTRAST  TECHNIQUE: Multiplanar, multisequence MR imaging was performed. No intravenous contrast was administered.  COMPARISON:  MRI dated 12/24/2010  FINDINGS: Normal conus tip at L1.  Normal paraspinal soft tissues.  T12-L1 and L1-2: Normal.  L2-3: Small focal disc protrusion into the right lateral recess slightly indenting the  thecal sac extending slightly inferiorly in the right lateral recess best seen on image number 13 of series 7. This does have a mass effect upon the right L3 nerve. This appears slightly more prominent than on the prior exam.  L3-4: The disc is normal. Minimal degenerative changes of the facet joints, unchanged.  L4-5: Tiny disc bulge into the left neural foramen with no neural impingement, slightly increased. Minimal degenerative changes of the right facet joint, stable.  L5-S1: Focal annular tear and tiny disc bulge into the left lateral recess without neural impingement, unchanged.  IMPRESSION: Progressive small disc protrusion at L2-3 into the right lateral recess with more mass effect upon the thecal sac and the right L3 nerve rootlets.  No other significant change.   Electronically Signed   By: Geanie CooleyJim  Maxwell M.D.   On: 04/21/2013 16:05     PMFS History: Patient Active Problem List   Diagnosis Date Noted  . Spondylosis without myelopathy or radiculopathy, cervical region 04/21/2018  . Neck pain 03/17/2018  . Chronic bilateral low back pain without sciatica 03/17/2018  . Encounter for screening  colonoscopy 10/27/2017  . Failed conscious sedation during procedure 10/27/2017  . Chronic diarrhea 01/09/2015  . GERD (gastroesophageal reflux disease) 09/30/2010  . Constipation 09/30/2010   Past Medical History:  Diagnosis Date  . Anxiety   . Clostridium difficile colitis 12/2010  . Depression   . GERD (gastroesophageal reflux disease)   . HTN (hypertension)    physician stopped BP med  . Seasonal allergies     Family History  Problem Relation Age of Onset  . Lung cancer Father        asbestosis  . COPD Mother        Died 6195  . Colon cancer Neg Hx     Past Surgical History:  Procedure Laterality Date  . ABDOMINAL HYSTERECTOMY    . CARPAL TUNNEL RELEASE Right 01/07/2018   Procedure: RIGHT CARPAL TUNNEL RELEASE;  Surgeon: Cindee SaltKuzma, Gary, MD;  Location: [facility];  Service: Orthopedics;  Laterality: Right;  FAB  . CERVICAL CONIZATION W/BX     for precervical cancer in remote past  . CESAREAN SECTION    . COLONOSCOPY  09/2007   anal papilla, normal colon and terminal ileum  . COLONOSCOPY WITH PROPOFOL N/A 02/10/2018   Procedure: COLONOSCOPY WITH PROPOFOL;  Surgeon: Corbin Adeourk, Robert M, MD;  [facility];  Service: Endoscopy;  Laterality: N/A;  1:30pm  . complete hysterectomy    . ESOPHAGOGASTRODUODENOSCOPY  09/2007   solitary inlet patch (bx neg), antral erosions  . TUBAL LIGATION     Social History   Occupational History    Employer: [employer]  Tobacco Use  . Smoking status: Current Every Day Smoker    Packs/day: 1.00    Years: 25.00    Pack years: 25.00    Types: Cigarettes  . Smokeless tobacco: Never Used  Substance and Sexual Activity  . Alcohol use: Yes    Alcohol/week: 0.0 standard drinks    Comment: 4 glass of red wine a night   . Drug use: No  . Sexual activity: Not on file

## 2018-05-17 ENCOUNTER — Ambulatory Visit (HOSPITAL_COMMUNITY): Payer: BLUE CROSS/BLUE SHIELD | Admitting: Physical Therapy

## 2018-05-17 DIAGNOSIS — M545 Low back pain: Secondary | ICD-10-CM | POA: Diagnosis not present

## 2018-05-17 DIAGNOSIS — M6281 Muscle weakness (generalized): Secondary | ICD-10-CM | POA: Diagnosis not present

## 2018-05-17 DIAGNOSIS — R29898 Other symptoms and signs involving the musculoskeletal system: Secondary | ICD-10-CM | POA: Diagnosis not present

## 2018-05-17 DIAGNOSIS — G8929 Other chronic pain: Secondary | ICD-10-CM

## 2018-05-17 DIAGNOSIS — M5412 Radiculopathy, cervical region: Secondary | ICD-10-CM | POA: Diagnosis not present

## 2018-05-17 NOTE — Patient Instructions (Addendum)
Strengthening: Straight Leg Raise (Phase 1)    Tighten muscles on front of right thigh, then lift leg _18___ inches from surface, keeping knee locked.  Repeat _10___ times per set. Do _1___ sets per session. Do 2____ sessions per day.  http://orth.exer.us/614   Copyright  VHI. All rights reserved.  Strengthening: Hip Abduction (Side-Lying)    Tighten muscles on front of left thigh, then lift leg _15___ inches from surface, keeping knee locked.  Repeat __10__ times per set. Do ___1_ sets per session. Do __2__ sessions per day.  http://orth.exer.us/622   Copyright  VHI. All rights reserved.  Straight Leg Raise (Prone)    Abdomen and head supported, keep left knee locked and raise leg at hip. Avoid arching low back. Repeat _10___ times per set. Do __1__ sets per session. Do __2__ sessions per day.  http://orth.exer.us/1112   Copyright  VHI. All rights reserved.

## 2018-05-17 NOTE — Therapy (Signed)
[facility] [address] [place], [place], [address] Phone: [phone]   Fax:  [phone]  Physical Therapy Treatment  Patient Details  Name: Heather Davidson MRN: [id number] Date of Birth: 04/20/1961 Referring Provider (PT): Annell GreeningMark Yates, MD   Encounter Date: 05/17/2018  PT End of Session - 05/17/18 1100    Visit Number  10   Progress note completed 8th visit   Number of Visits  16    Date for PT Re-Evaluation  05/24/18    Authorization Type  BCBS    Authorization - Visit Number  10    Authorization - Number of Visits  18    PT Start Time  1035    PT Stop Time  1115    PT Time Calculation (min)  40 min    Activity Tolerance  Patient tolerated treatment well    Behavior During Therapy  [facility] for tasks assessed/performed       Past Medical History:  Diagnosis Date  . Anxiety   . Clostridium difficile colitis 12/2010  . Depression   . GERD (gastroesophageal reflux disease)   . HTN (hypertension)    physician stopped BP med  . Seasonal allergies     Past Surgical History:  Procedure Laterality Date  . ABDOMINAL HYSTERECTOMY    . CARPAL TUNNEL RELEASE Right 01/07/2018   Procedure: RIGHT CARPAL TUNNEL RELEASE;  Surgeon: Cindee SaltKuzma, Gary, MD;  Location: [facility];  Service: Orthopedics;  Laterality: Right;  FAB  . CERVICAL CONIZATION W/BX     for precervical cancer in remote past  . CESAREAN SECTION    . COLONOSCOPY  09/2007   anal papilla, normal colon and terminal ileum  . COLONOSCOPY WITH PROPOFOL N/A 02/10/2018   Procedure: COLONOSCOPY WITH PROPOFOL;  Surgeon: Corbin Adeourk, Robert M, MD;  [facility];  Service: Endoscopy;  Laterality: N/A;  1:30pm  . complete hysterectomy    . ESOPHAGOGASTRODUODENOSCOPY  09/2007   solitary inlet patch (bx neg), antral erosions  . TUBAL LIGATION      There were no vitals filed for this visit.  Subjective Assessment - 05/17/18 1033    Subjective  Pt states she is doing well  with her HEP     Limitations  Sitting    Currently in Pain?  Yes    Pain Score  6     Pain Location  Back    Pain Orientation  Lower    Pain Descriptors / Indicators  Aching    Pain Type  Chronic pain    Pain Onset  More than a month ago    Pain Frequency  Intermittent    Aggravating Factors   weight bearing     Pain Relieving Factors  stretching and meds     Effect of Pain on Daily Activities  limits                        [facility] Adult PT Treatment/Exercise - 05/17/18 0001      Exercises   Exercises  Lumbar      Lumbar Exercises: Stretches   Active Hamstring Stretch  3 reps;30 seconds    Prone on Elbows Stretch  1 rep;60 seconds    Press Ups  5 reps    Figure 4 Stretch  3 reps;30 seconds    Other Lumbar Stretch Exercise  3 D hip excursion       Lumbar Exercises: Standing   Heel Raises  10 reps    Functional Squats  10 reps    Forward Lunge  10 reps    Side Lunge  10 reps    Other Standing Lumbar Exercises  vector stances 5" x 3       Lumbar Exercises: Supine   Bridge  10 reps;Limitations    Bridge Limitations  10seconds       Lumbar Exercises: Sidelying   Hip Abduction  15 reps      Lumbar Exercises: Prone   Straight Leg Raise  15 reps    Other Prone Lumbar Exercises  heel squeeze                PT Short Term Goals - 05/10/18 1110      PT SHORT TERM GOAL #1   Title  PT to be I in HEP to improve cervical and scapular strength to improve tolerance of functional use of pt UE's.     Time  2    Period  Weeks    Status  Achieved      PT SHORT TERM GOAL #2   Title  PT to cervical strength to be 4/5 to allow pt pain to be decreased to no greater than a 6/10 to allow pt to be able to rest better.     Baseline  12/17: cervical strength 4/5; pain has been averaging about a 6/10 over the last 1-2 weeks    Time  2    Period  Weeks    Status  Achieved      PT SHORT TERM GOAL #3   Title  PT to be able to complete light activty, ie folding  clothes, for 20 minutes without having increased pain.     Baseline  12/17: still having trouble with this     Time  2    Period  Weeks    Status  On-going      PT SHORT TERM GOAL #4   Title  Pt will have improved lumbar ROM to [facility] and without pain in order to demo reduced soft tissue restrictions and improved joint mobility.     Time  2    Period  Weeks    Status  On-going      PT SHORT TERM GOAL #5   Title  Pt will have improved proximal hip strength to 4/5 in order to reduce LBP.    Time  2    Period  Weeks    Status  On-going      PT SHORT TERM GOAL #6   Title  Pt will report being able to cook for 45 mins without increases in LBP to demo improved functional strength and overall function at home.     Time  2    Period  Weeks    Status  New      PT SHORT TERM GOAL #7   Title  Pt will report being able to sweep, mop, and vacuum for 30 mins before needing a rest break to demo improved core and functional strength and tolerance to flexion ROM.     Time  2    Period  Weeks    Status  New        PT Long Term Goals - 05/10/18 1111      PT LONG TERM GOAL #1   Title  Pt cervical strength to be at least a 4+/5 to allow pt pain level to be decreased by no greater than a 4/10 to  allow pt to be able to complete light housework tasks for 30 minutes or greater.     Baseline  12/17: 4/5 cervical MMT; pain has been averaging about a 6/10 over the last 1-2 weeks; still having difficulty completing light house work    Time  4    Period  Weeks    Status  On-going      PT LONG TERM GOAL #2   Title  PT to be able to stablilze her cervical spine while putting dishes and groceries up to allow pt to complete these tasks without experiencing increaed pain.     Baseline  12/17: no pain with this    Time  4    Period  Weeks    Status  Achieved      PT LONG TERM GOAL #3   Title  Pt will have improved proximal hip strength to 4+/5 in order to further reduce LBP and maximize her ability to  complete HH chores with greater ease.    Time  4    Period  Weeks    Status  On-going      PT LONG TERM GOAL #4   Title  Pt will report being able to stand and cook for 1+hours without increases in LBP or need for rest break to further demo improved core and functional strength and demo improved tolerance to standing.    Time  4    Period  Weeks    Status  On-going      PT LONG TERM GOAL #5   Title  Pt will report being able to sweep, mop, and vacuum for 1 hour before needing a break and without increases in LBP to further demo improved function and maximize her ability to complete HH chores with greater ease.     Time  4    Period  Weeks    Status  On-going      PT LONG TERM GOAL #6   Title  Pt will report being able to lift her grandchild up without increases and LBP and with proper form to demo improved core and functional strength and reduce her risk for reinjury with lifting.    Time  4    Period  Weeks    Status  On-going            Plan - 05/17/18 1101    Clinical Impression Statement  PT is to continue with lumbar rehab per Dr. Ophelia Charter note;  Added stretching and more standing stabilization exercises to pt program with pt needing verbal cuing multiple times for correct technique.      Rehab Potential  Fair    PT Frequency  2x / week    PT Duration  4 weeks    PT Treatment/Interventions  ADLs/Self Care Home Management;Patient/family education;Manual techniques;Therapeutic activities;Therapeutic exercise;Traction;Aquatic Therapy;Cryotherapy;Electrical Stimulation;Moist Heat;Gait training;Stair training;Functional mobility training;Passive range of motion;Dry needling;Taping;Spinal Manipulations;Joint Manipulations    PT Next Visit Plan  Progress core, BLE, functional strengthening, general stretching and mobility work, manual for [employer] as needed.      PT Home Exercise Plan  isometrics, scapular retraction, ; 03/28/2018 - upper trap, levator stretches; 12/24: LTR, supine figure  4 (pulling to opposite shoulder), SKTC, DTKC; 1/14:  added all 4 SLR     Consulted and Agree with Plan of Care  Patient       Patient will benefit from skilled therapeutic intervention in order to improve the following deficits and impairments:  Decreased activity tolerance, Decreased  range of motion, Decreased strength, Increased muscle spasms, Impaired perceived functional ability, Impaired UE functional use, Pain, Abnormal gait, Decreased endurance, Hypomobility, Increased fascial restricitons, Postural dysfunction, Improper body mechanics, Decreased balance  Visit Diagnosis: Chronic bilateral low back pain without sciatica  Muscle weakness (generalized)  Other symptoms and signs involving the musculoskeletal system     Problem List Patient Active Problem List   Diagnosis Date Noted  . Spondylosis without myelopathy or radiculopathy, cervical region 04/21/2018  . Neck pain 03/17/2018  . Chronic bilateral low back pain without sciatica 03/17/2018  . Encounter for screening colonoscopy 10/27/2017  . Failed conscious sedation during procedure 10/27/2017  . Chronic diarrhea 01/09/2015  . GERD (gastroesophageal reflux disease) 09/30/2010  . Constipation 09/30/2010    Virgina Organynthia Lessie Manigo, PT CLT [phone] 05/17/2018, 11:22 AM  [facility] [address] [place], [place], [address] Phone: [phone]   Fax:  [phone]  Name: Heather Davidson MRN: [id number] Date of Birth: 12/28/1960

## 2018-05-19 ENCOUNTER — Encounter (HOSPITAL_COMMUNITY): Payer: Self-pay | Admitting: Physical Therapy

## 2018-05-19 ENCOUNTER — Ambulatory Visit (HOSPITAL_COMMUNITY): Payer: BLUE CROSS/BLUE SHIELD | Admitting: Physical Therapy

## 2018-05-19 DIAGNOSIS — R29898 Other symptoms and signs involving the musculoskeletal system: Secondary | ICD-10-CM | POA: Diagnosis not present

## 2018-05-19 DIAGNOSIS — M6281 Muscle weakness (generalized): Secondary | ICD-10-CM

## 2018-05-19 DIAGNOSIS — M545 Low back pain, unspecified: Secondary | ICD-10-CM

## 2018-05-19 DIAGNOSIS — G8929 Other chronic pain: Secondary | ICD-10-CM

## 2018-05-19 DIAGNOSIS — M5412 Radiculopathy, cervical region: Secondary | ICD-10-CM | POA: Diagnosis not present

## 2018-05-19 NOTE — Therapy (Signed)
[facility] [address] [place], [place], [address] Phone: [phone]   Fax:  [phone]  Physical Therapy Treatment  Patient Details  Name: Heather Davidson MRN: [id number] Date of Birth: 01/06/1961 Referring Provider (PT): Annell GreeningMark Yates, MD   Encounter Date: 05/19/2018  PT End of Session - 05/19/18 1033    Visit Number  11   Progress note completed 8th visit   Number of Visits  16    Date for PT Re-Evaluation  05/24/18    Authorization Type  BCBS    Authorization - Visit Number  11    Authorization - Number of Visits  18    PT Start Time  1035    PT Stop Time  1115    PT Time Calculation (min)  40 min    Activity Tolerance  Patient tolerated treatment well    Behavior During Therapy  [facility] for tasks assessed/performed       Past Medical History:  Diagnosis Date  . Anxiety   . Clostridium difficile colitis 12/2010  . Depression   . GERD (gastroesophageal reflux disease)   . HTN (hypertension)    physician stopped BP med  . Seasonal allergies     Past Surgical History:  Procedure Laterality Date  . ABDOMINAL HYSTERECTOMY    . CARPAL TUNNEL RELEASE Right 01/07/2018   Procedure: RIGHT CARPAL TUNNEL RELEASE;  Surgeon: Cindee SaltKuzma, Gary, MD;  [facility];  Service: Orthopedics;  Laterality: Right;  FAB  . CERVICAL CONIZATION W/BX     for precervical cancer in remote past  . CESAREAN SECTION    . COLONOSCOPY  09/2007   anal papilla, normal colon and terminal ileum  . COLONOSCOPY WITH PROPOFOL N/A 02/10/2018   Procedure: COLONOSCOPY WITH PROPOFOL;  Surgeon: Corbin Adeourk, Robert M, MD;  [facility];  Service: Endoscopy;  Laterality: N/A;  1:30pm  . complete hysterectomy    . ESOPHAGOGASTRODUODENOSCOPY  09/2007   solitary inlet patch (bx neg), antral erosions  . TUBAL LIGATION      There were no vitals filed for this visit.  Subjective Assessment - 05/19/18 1033    Limitations  Lifting;Standing;House hold  activities    How long can you sit comfortably?  30 minutes with support    How long can you stand comfortably?  20 mins    How long can you walk comfortably?  a good 30 mins    Patient Stated Goals  be able to stand up and cook a big meal    Pain Onset  More than a month ago            [facility]/Exercise - 05/19/18 0001      Exercises   Exercises  Lumbar      Lumbar Exercises: Stretches   Active Hamstring Stretch  3 reps;30 seconds    Single Knee to Chest Stretch  2 reps;30 seconds    Prone on Elbows Stretch  1 rep;60 seconds    Press Ups  5 reps    Prone Mid Back Stretch  5 reps    Figure 4 Stretch  3 reps;30 seconds    Other Lumbar Stretch Exercise  --      Lumbar Exercises: Standing   Heel Raises  10 reps    Heel Raises Limitations  2# wt arms go into flexion combined with squats     Functional Squats  10 reps    Functional Squats Limitations  arms  to 90 degrees     Forward Lunge  15 reps    Side Lunge  15 reps    Other Standing Lumbar Exercises  hip extension kick backs with green t-band x 10 , vector stances 10" x 3     Other Standing Lumbar Exercises  wall arch x 5; wall push up x 10; side step with green tband  x 2 RT       Lumbar Exercises: Seated   Sit to Stand  10 reps      Lumbar Exercises: Supine   Bridge  --    Bridge Limitations  --      Lumbar Exercises: Sidelying   Hip Abduction  15 reps      Lumbar Exercises: Prone   Straight Leg Raise  15 reps    Other Prone Lumbar Exercises  heel squeeze                PT Short Term Goals - 05/10/18 1110      PT SHORT TERM GOAL #1   Title  PT to be I in HEP to improve cervical and scapular strength to improve tolerance of functional use of pt UE's.     Time  2    Period  Weeks    Status  Achieved      PT SHORT TERM GOAL #2   Title  PT to cervical strength to be 4/5 to allow pt pain to be decreased to no greater than a 6/10 to allow pt to be able to rest better.     Baseline   12/17: cervical strength 4/5; pain has been averaging about a 6/10 over the last 1-2 weeks    Time  2    Period  Weeks    Status  Achieved      PT SHORT TERM GOAL #3   Title  PT to be able to complete light activty, ie folding clothes, for 20 minutes without having increased pain.     Baseline  12/17: still having trouble with this     Time  2    Period  Weeks    Status  On-going      PT SHORT TERM GOAL #4   Title  Pt will have improved lumbar ROM to [facility] and without pain in order to demo reduced soft tissue restrictions and improved joint mobility.     Time  2    Period  Weeks    Status  On-going      PT SHORT TERM GOAL #5   Title  Pt will have improved proximal hip strength to 4/5 in order to reduce LBP.    Time  2    Period  Weeks    Status  On-going      PT SHORT TERM GOAL #6   Title  Pt will report being able to cook for 45 mins without increases in LBP to demo improved functional strength and overall function at home.     Time  2    Period  Weeks    Status  New      PT SHORT TERM GOAL #7   Title  Pt will report being able to sweep, mop, and vacuum for 30 mins before needing a rest break to demo improved core and functional strength and tolerance to flexion ROM.     Time  2    Period  Weeks    Status  New  PT Long Term Goals - 05/10/18 1111      PT LONG TERM GOAL #1   Title  Pt cervical strength to be at least a 4+/5 to allow pt pain level to be decreased by no greater than a 4/10 to allow pt to be able to complete light housework tasks for 30 minutes or greater.     Baseline  12/17: 4/5 cervical MMT; pain has been averaging about a 6/10 over the last 1-2 weeks; still having difficulty completing light house work    Time  4    Period  Weeks    Status  On-going      PT LONG TERM GOAL #2   Title  PT to be able to stablilze her cervical spine while putting dishes and groceries up to allow pt to complete these tasks without experiencing increaed pain.      Baseline  12/17: no pain with this    Time  4    Period  Weeks    Status  Achieved      PT LONG TERM GOAL #3   Title  Pt will have improved proximal hip strength to 4+/5 in order to further reduce LBP and maximize her ability to complete HH chores with greater ease.    Time  4    Period  Weeks    Status  On-going      PT LONG TERM GOAL #4   Title  Pt will report being able to stand and cook for 1+hours without increases in LBP or need for rest break to further demo improved core and functional strength and demo improved tolerance to standing.    Time  4    Period  Weeks    Status  On-going      PT LONG TERM GOAL #5   Title  Pt will report being able to sweep, mop, and vacuum for 1 hour before needing a break and without increases in LBP to further demo improved function and maximize her ability to complete HH chores with greater ease.     Time  4    Period  Weeks    Status  On-going      PT LONG TERM GOAL #6   Title  Pt will report being able to lift her grandchild up without increases and LBP and with proper form to demo improved core and functional strength and reduce her risk for reinjury with lifting.    Time  4    Period  Weeks    Status  On-going            Plan - 05/19/18 1034    Clinical Impression Statement  Pt needs minimal verbal cuing to perform exercises in correct technique.  Continuted to progress weight bearing stabiliaztion exercises for improved functional mobility.     Rehab Potential  Fair    PT Frequency  2x / week    PT Duration  4 weeks    PT Treatment/Interventions  ADLs/Self Care Home Management;Patient/family education;Manual techniques;Therapeutic activities;Therapeutic exercise;Traction;Aquatic Therapy;Cryotherapy;Electrical Stimulation;Moist Heat;Gait training;Stair training;Functional mobility training;Passive range of motion;Dry needling;Taping;Spinal Manipulations;Joint Manipulations    PT Next Visit Plan  Progress core, BLE, functional  strengthening, general stretching and mobility work, manual for [employer] as needed.      PT Home Exercise Plan  isometrics, scapular retraction, ; 03/28/2018 - upper trap, levator stretches; 12/24: LTR, supine figure 4 (pulling to opposite shoulder), SKTC, DTKC; 1/14:  added all 4 SLR     Consulted  and Agree with Plan of Care  Patient       Patient will benefit from skilled therapeutic intervention in order to improve the following deficits and impairments:  Decreased activity tolerance, Decreased range of motion, Decreased strength, Increased muscle spasms, Impaired perceived functional ability, Impaired UE functional use, Pain, Abnormal gait, Decreased endurance, Hypomobility, Increased fascial restricitons, Postural dysfunction, Improper body mechanics, Decreased balance  Visit Diagnosis: Chronic bilateral low back pain without sciatica  Muscle weakness (generalized)  Other symptoms and signs involving the musculoskeletal system  Radiculopathy, cervical region     Problem List Patient Active Problem List   Diagnosis Date Noted  . Spondylosis without myelopathy or radiculopathy, cervical region 04/21/2018  . Neck pain 03/17/2018  . Chronic bilateral low back pain without sciatica 03/17/2018  . Encounter for screening colonoscopy 10/27/2017  . Failed conscious sedation during procedure 10/27/2017  . Chronic diarrhea 01/09/2015  . GERD (gastroesophageal reflux disease) 09/30/2010  . Constipation 09/30/2010   Virgina Organ, PT CLT [phone] 05/19/2018, 11:14 AM  [facility] [address] [place], [place], [address] Phone: [phone]   Fax:  [phone]  Name: Eleesa Vanaken MRN: [id number] Date of Birth: 06-Oct-1960

## 2018-05-24 ENCOUNTER — Ambulatory Visit (HOSPITAL_COMMUNITY): Payer: BLUE CROSS/BLUE SHIELD | Admitting: Physical Therapy

## 2018-05-24 ENCOUNTER — Encounter (HOSPITAL_COMMUNITY): Payer: Self-pay | Admitting: Physical Therapy

## 2018-05-24 DIAGNOSIS — R29898 Other symptoms and signs involving the musculoskeletal system: Secondary | ICD-10-CM

## 2018-05-24 DIAGNOSIS — G8929 Other chronic pain: Secondary | ICD-10-CM | POA: Diagnosis not present

## 2018-05-24 DIAGNOSIS — M5412 Radiculopathy, cervical region: Secondary | ICD-10-CM | POA: Diagnosis not present

## 2018-05-24 DIAGNOSIS — M545 Low back pain: Principal | ICD-10-CM

## 2018-05-24 DIAGNOSIS — M6281 Muscle weakness (generalized): Secondary | ICD-10-CM | POA: Diagnosis not present

## 2018-05-24 NOTE — Patient Instructions (Addendum)
Functional Quadriceps: Sit to Stand    Sit on edge of chair, feet flat on floor. Stand upright, extending knees fully. Repeat _10___ times per set. Do _1___ sets per session. Do _2___ sessions per day.  http://orth.exer.us/734   Copyright  VHI. All rights reserved.  Wall Slide    Keep head, shoulders, and back against wall, with feet out in front and slightly wider than shoulder width. Slowly lower buttocks by sliding down wall until thighs are parallel to floor. Keep back flat. Repeat _10___ times per set. Do _1___ sets per session. Do __2__ sessions per day.  http://orth.exer.us/152   Copyright  VHI. All rights reserved.  Forward Lunge    Standing with feet shoulder width apart and stomach tight, step forward with left leg. Repeat _10___ times per set. Do _1___ sets per session. Do __2__ sessions per day.  http://orth.exer.us/1146   Copyright  VHI. All rights reserved.  Side Lunge    Stand with knees slightly bent, stomach tight. Step to side with right leg. Repeat _10___ times per set. Do _1___ sets per session. Do __2__ sessions per day.  http://orth.exer.us/1148   Copyright  VHI. All rights reserved.

## 2018-05-24 NOTE — Therapy (Addendum)
[facility] [address] [place], [place], [address] Phone: [phone]   Fax:  [phone]  Physical Therapy Treatment  Patient Details  Name: Heather Davidson MRN: [id number] Date of Birth: October 27, 1960 Referring Provider (PT): Annell Greening, MD   Encounter Date: 05/24/2018  PT End of Session - 05/24/18 1050    Visit Number  12   Progress note completed 8th visit   Number of Visits  16    Date for PT Re-Evaluation  05/24/18    Authorization Type  BCBS    Authorization - Visit Number  12    Authorization - Number of Visits  16    PT Start Time  1035    PT Stop Time  1120    PT Time Calculation (min)  45 min    Activity Tolerance  Patient tolerated treatment well    Behavior During Therapy  [facility] for tasks assessed/performed       Past Medical History:  Diagnosis Date  . Anxiety   . Clostridium difficile colitis 12/2010  . Depression   . GERD (gastroesophageal reflux disease)   . HTN (hypertension)    physician stopped BP med  . Seasonal allergies     Past Surgical History:  Procedure Laterality Date  . ABDOMINAL HYSTERECTOMY    . CARPAL TUNNEL RELEASE Right 01/07/2018   Procedure: RIGHT CARPAL TUNNEL RELEASE;  Surgeon: Cindee Salt, MD;  Location: [facility];  Service: Orthopedics;  Laterality: Right;  FAB  . CERVICAL CONIZATION W/BX     for precervical cancer in remote past  . CESAREAN SECTION    . COLONOSCOPY  09/2007   anal papilla, normal colon and terminal ileum  . COLONOSCOPY WITH PROPOFOL N/A 02/10/2018   Procedure: COLONOSCOPY WITH PROPOFOL;  Surgeon: Corbin Ade, MD;  [facility];  Service: Endoscopy;  Laterality: N/A;  1:30pm  . complete hysterectomy    . ESOPHAGOGASTRODUODENOSCOPY  09/2007   solitary inlet patch (bx neg), antral erosions  . TUBAL LIGATION      There were no vitals filed for this visit.  Subjective Assessment - 05/24/18 1040    Subjective  Pt states that her back pain  is about a 3; she has no questions on the exercises     Limitations  Lifting;Standing;House hold activities    How long can you sit comfortably?  30 minutes with support    How long can you stand comfortably?  20 mins    How long can you walk comfortably?  a good 30 mins    Patient Stated Goals  be able to stand up and cook a big meal    Currently in Pain?  Yes    Pain Score  3     Pain Location  Back    Pain Orientation  Lower;Left    Pain Descriptors / Indicators  Aching    Pain Type  Chronic pain    Pain Onset  More than a month ago    Pain Frequency  Intermittent    Aggravating Factors   activity     Pain Relieving Factors  stretching                        [facility] Adult PT Treatment/Exercise - 05/24/18 0001      Balance Poses: Yoga   Warrior I  2 reps;30 seconds    Warrior II  2 reps;30 seconds  Exercises   Exercises  Lumbar      Neck Exercises: Machines for Strengthening   Other Machines for Strengthening  hip extension 5 pl 10 x 2 reps       Lumbar Exercises: Stretches   Prone on Elbows Stretch  1 rep;60 seconds    Press Ups  10 reps    Prone Mid Back Stretch  5 reps      Lumbar Exercises: Standing   Wall Slides  10 reps    Other Standing Lumbar Exercises  hip excursion x 3; Tandem on foam with Palov w/red t-band     Other Standing Lumbar Exercises  wall push up x 15.       Lumbar Exercises: Seated   Sit to Stand  20 reps      Lumbar Exercises: Prone   Opposite Arm/Leg Raise  Right arm/Left leg;Left arm/Right leg;10 reps    Other Prone Lumbar Exercises  fire hydrant 10                PT Short Term Goals - 05/10/18 1110      PT SHORT TERM GOAL #1   Title  PT to be I in HEP to improve cervical and scapular strength to improve tolerance of functional use of pt UE's.     Time  2    Period  Weeks    Status  Achieved      PT SHORT TERM GOAL #2   Title  PT to cervical strength to be 4/5 to allow pt pain to be decreased to no greater  than a 6/10 to allow pt to be able to rest better.     Baseline  12/17: cervical strength 4/5; pain has been averaging about a 6/10 over the last 1-2 weeks    Time  2    Period  Weeks    Status  Achieved      PT SHORT TERM GOAL #3   Title  PT to be able to complete light activty, ie folding clothes, for 20 minutes without having increased pain.     Baseline  12/17: still having trouble with this     Time  2    Period  Weeks    Status  On-going      PT SHORT TERM GOAL #4   Title  Pt will have improved lumbar ROM to [facility] and without pain in order to demo reduced soft tissue restrictions and improved joint mobility.     Time  2    Period  Weeks    Status  On-going      PT SHORT TERM GOAL #5   Title  Pt will have improved proximal hip strength to 4/5 in order to reduce LBP.    Time  2    Period  Weeks    Status  On-going      PT SHORT TERM GOAL #6   Title  Pt will report being able to cook for 45 mins without increases in LBP to demo improved functional strength and overall function at home.     Time  2    Period  Weeks    Status  New      PT SHORT TERM GOAL #7   Title  Pt will report being able to sweep, mop, and vacuum for 30 mins before needing a rest break to demo improved core and functional strength and tolerance to flexion ROM.     Time  2  Period  Weeks    Status  New        PT Long Term Goals - 05/10/18 1111      PT LONG TERM GOAL #1   Title  Pt cervical strength to be at least a 4+/5 to allow pt pain level to be decreased by no greater than a 4/10 to allow pt to be able to complete light housework tasks for 30 minutes or greater.     Baseline  12/17: 4/5 cervical MMT; pain has been averaging about a 6/10 over the last 1-2 weeks; still having difficulty completing light house work    Time  4    Period  Weeks    Status  On-going      PT LONG TERM GOAL #2   Title  PT to be able to stablilze her cervical spine while putting dishes and groceries up to allow pt to  complete these tasks without experiencing increaed pain.     Baseline  12/17: no pain with this    Time  4    Period  Weeks    Status  Achieved      PT LONG TERM GOAL #3   Title  Pt will have improved proximal hip strength to 4+/5 in order to further reduce LBP and maximize her ability to complete HH chores with greater ease.    Time  4    Period  Weeks    Status  On-going      PT LONG TERM GOAL #4   Title  Pt will report being able to stand and cook for 1+hours without increases in LBP or need for rest break to further demo improved core and functional strength and demo improved tolerance to standing.    Time  4    Period  Weeks    Status  On-going      PT LONG TERM GOAL #5   Title  Pt will report being able to sweep, mop, and vacuum for 1 hour before needing a break and without increases in LBP to further demo improved function and maximize her ability to complete HH chores with greater ease.     Time  4    Period  Weeks    Status  On-going      PT LONG TERM GOAL #6   Title  Pt will report being able to lift her grandchild up without increases and LBP and with proper form to demo improved core and functional strength and reduce her risk for reinjury with lifting.    Time  4    Period  Weeks    Status  On-going            Plan - 05/24/18 1114    Clinical Impression Statement PT initally seen for cervical pain on 04/19/2018.  Cervical pain resolved and MD requested continued treatment for lumbar which was started on 04/27/2019.  Pt is doing well with lumbar rehab but needs cert updated.  Requesting 4 more visits to finish  Added higher level stabilization exercises with good form.  PT continues to improve in awareness of keeping core engaged.  Low back pain decreasing as expected.  PT will continue to benefit from skilled therapy to instruct in higher level core activities.     Rehab Potential  Fair    PT Frequency  2x / week    PT Duration  4 weeks    PT  Treatment/Interventions  ADLs/Self Care Home Management;Patient/family education;Manual techniques;Therapeutic activities;Therapeutic exercise;Traction;Aquatic  Therapy;Cryotherapy;Electrical Stimulation;Moist Heat;Gait training;Stair training;Functional mobility training;Passive range of motion;Dry needling;Taping;Spinal Manipulations;Joint Manipulations    PT Next Visit Plan  Begin instrucction for lifting from 12" to waist and waist to shoulder level.     PT Home Exercise Plan  isometrics, scapular retraction, ; 03/28/2018 - upper trap, levator stretches; 12/24: LTR, supine figure 4 (pulling to opposite shoulder), SKTC, DTKC; 1/14:  added all 4 SLR     Consulted and Agree with Plan of Care  Patient       Patient will benefit from skilled therapeutic intervention in order to improve the following deficits and impairments:  Decreased activity tolerance, Decreased range of motion, Decreased strength, Increased muscle spasms, Impaired perceived functional ability, Impaired UE functional use, Pain, Abnormal gait, Decreased endurance, Hypomobility, Increased fascial restricitons, Postural dysfunction, Improper body mechanics, Decreased balance  Visit Diagnosis: Chronic bilateral low back pain without sciatica  Muscle weakness (generalized)  Other symptoms and signs involving the musculoskeletal system     Problem List Patient Active Problem List   Diagnosis Date Noted  . Spondylosis without myelopathy or radiculopathy, cervical region 04/21/2018  . Neck pain 03/17/2018  . Chronic bilateral low back pain without sciatica 03/17/2018  . Encounter for screening colonoscopy 10/27/2017  . Failed conscious sedation during procedure 10/27/2017  . Chronic diarrhea 01/09/2015  . GERD (gastroesophageal reflux disease) 09/30/2010  . Constipation 09/30/2010   Virgina Organynthia , PT CLT [phone] 05/24/2018, 11:18 AM  [facility] [address]  [place], [place] Davidson, Heather Phone: [phone]   Fax:  Davidson  Name: Heather Davidson MRN: [id number] Date of Birth: 02/07/1961

## 2018-06-01 ENCOUNTER — Encounter (HOSPITAL_COMMUNITY): Payer: Self-pay | Admitting: Physical Therapy

## 2018-06-01 ENCOUNTER — Ambulatory Visit (HOSPITAL_COMMUNITY): Payer: BLUE CROSS/BLUE SHIELD | Admitting: Physical Therapy

## 2018-06-01 DIAGNOSIS — G8929 Other chronic pain: Secondary | ICD-10-CM | POA: Diagnosis not present

## 2018-06-01 DIAGNOSIS — R29898 Other symptoms and signs involving the musculoskeletal system: Secondary | ICD-10-CM | POA: Diagnosis not present

## 2018-06-01 DIAGNOSIS — M6281 Muscle weakness (generalized): Secondary | ICD-10-CM | POA: Diagnosis not present

## 2018-06-01 DIAGNOSIS — M545 Low back pain, unspecified: Secondary | ICD-10-CM

## 2018-06-01 DIAGNOSIS — M5412 Radiculopathy, cervical region: Secondary | ICD-10-CM | POA: Diagnosis not present

## 2018-06-01 NOTE — Therapy (Signed)
[facility] [address] [place], [place], [address] Phone: [phone]   Fax:  [phone]  Physical Therapy Treatment  Patient Details  Name: Heather Davidson MRN: [id number] Date of Birth: 07/19/1960 Referring Provider (PT): Annell GreeningMark Yates, MD   Encounter Date: 06/01/2018  PT End of Session - 06/01/18 1512    Visit Number  13   Progress note completed 8th visit   Number of Visits  16    Date for PT Re-Evaluation  05/24/18    Authorization Type  BCBS    Authorization - Visit Number  13    Authorization - Number of Visits  16    PT Start Time  1438    PT Stop Time  1520    PT Time Calculation (min)  42 min    Activity Tolerance  Patient tolerated treatment well    Behavior During Therapy  [facility] for tasks assessed/performed       Past Medical History:  Diagnosis Date  . Anxiety   . Clostridium difficile colitis 12/2010  . Depression   . GERD (gastroesophageal reflux disease)   . HTN (hypertension)    physician stopped BP med  . Seasonal allergies     Past Surgical History:  Procedure Laterality Date  . ABDOMINAL HYSTERECTOMY    . CARPAL TUNNEL RELEASE Right 01/07/2018   Procedure: RIGHT CARPAL TUNNEL RELEASE;  Surgeon: Cindee SaltKuzma, Gary, MD;  Location: [facility];  Service: Orthopedics;  Laterality: Right;  FAB  . CERVICAL CONIZATION W/BX     for precervical cancer in remote past  . CESAREAN SECTION    . COLONOSCOPY  09/2007   anal papilla, normal colon and terminal ileum  . COLONOSCOPY WITH PROPOFOL N/A 02/10/2018   Procedure: COLONOSCOPY WITH PROPOFOL;  Surgeon: Corbin Adeourk, Robert M, MD;  [facility];  Service: Endoscopy;  Laterality: N/A;  1:30pm  . complete hysterectomy    . ESOPHAGOGASTRODUODENOSCOPY  09/2007   solitary inlet patch (bx neg), antral erosions  . TUBAL LIGATION      There were no vitals filed for this visit.  Subjective Assessment - 06/01/18 1439    Subjective  PT states that she is  feeling better she has some Lt hip pain but her back is feeling well     Limitations  Lifting;Standing;House hold activities    How long can you sit comfortably?  30 minutes with support    How long can you stand comfortably?  20 mins    How long can you walk comfortably?  a good 30 mins    Patient Stated Goals  be able to stand up and cook a big meal    Currently in Pain?  Yes    Pain Score  5     Pain Location  Hip    Pain Orientation  Left    Pain Descriptors / Indicators  Aching    Pain Type  Chronic pain    Pain Onset  More than a month ago    Pain Frequency  Intermittent             [facility] Adult PT Treatment/Exercise - 06/01/18 0001      Balance Poses: Yoga   Warrior I  1 rep;60 seconds    Warrior II  1 rep;60 seconds      Exercises   Exercises  Lumbar      Neck Exercises: Paediatric nurseMachines for Strengthening   Other Machines for Strengthening  hip extension 5  pl 10 x 2 reps       Lumbar Exercises: Stretches   Prone on Elbows Stretch  1 rep;60 seconds    Press Ups  10 reps    Prone Mid Back Stretch  5 reps    Figure 4 Stretch  3 reps;30 seconds      Lumbar Exercises: Standing   Wall Slides  10 reps    Shoulder Adduction Limitations  lifting 4# waist to shoulder; box 12" to waist x 10 @    Other Standing Lumbar Exercises  Palvo green tband x 10 B; hip excursion x 3; Tandem on foam with Palov w/red t-band     Other Standing Lumbar Exercises  wall lift off with red t-band x 10 .wall push up x 15.       Lumbar Exercises: Seated   Sit to Stand  20 reps      Lumbar Exercises: Prone   Opposite Arm/Leg Raise  --    Other Prone Lumbar Exercises  fire hydrant 10       Lumbar Exercises: Quadruped   [place]  5 reps    Opposite Arm/Leg Raise  Right arm/Left leg;Left arm/Right leg;10 reps    Opposite Arm/Leg Raise Limitations  red t band                PT Short Term Goals - 05/10/18 1110      PT SHORT TERM GOAL #1   Title  PT to be I in HEP to improve  cervical and scapular strength to improve tolerance of functional use of pt UE's.     Time  2    Period  Weeks    Status  Achieved      PT SHORT TERM GOAL #2   Title  PT to cervical strength to be 4/5 to allow pt pain to be decreased to no greater than a 6/10 to allow pt to be able to rest better.     Baseline  12/17: cervical strength 4/5; pain has been averaging about a 6/10 over the last 1-2 weeks    Time  2    Period  Weeks    Status  Achieved      PT SHORT TERM GOAL #3   Title  PT to be able to complete light activty, ie folding clothes, for 20 minutes without having increased pain.     Baseline  12/17: still having trouble with this     Time  2    Period  Weeks    Status  On-going      PT SHORT TERM GOAL #4   Title  Pt will have improved lumbar ROM to [facility] and without pain in order to demo reduced soft tissue restrictions and improved joint mobility.     Time  2    Period  Weeks    Status  On-going      PT SHORT TERM GOAL #5   Title  Pt will have improved proximal hip strength to 4/5 in order to reduce LBP.    Time  2    Period  Weeks    Status  On-going      PT SHORT TERM GOAL #6   Title  Pt will report being able to cook for 45 mins without increases in LBP to demo improved functional strength and overall function at home.     Time  2    Period  Weeks    Status  New  PT SHORT TERM GOAL #7   Title  Pt will report being able to sweep, mop, and vacuum for 30 mins before needing a rest break to demo improved core and functional strength and tolerance to flexion ROM.     Time  2    Period  Weeks    Status  New        PT Long Term Goals - 05/10/18 1111      PT LONG TERM GOAL #1   Title  Pt cervical strength to be at least a 4+/5 to allow pt pain level to be decreased by no greater than a 4/10 to allow pt to be able to complete light housework tasks for 30 minutes or greater.     Baseline  12/17: 4/5 cervical MMT; pain has been averaging about a 6/10 over the  last 1-2 weeks; still having difficulty completing light house work    Time  4    Period  Weeks    Status  On-going      PT LONG TERM GOAL #2   Title  PT to be able to stablilze her cervical spine while putting dishes and groceries up to allow pt to complete these tasks without experiencing increaed pain.     Baseline  12/17: no pain with this    Time  4    Period  Weeks    Status  Achieved      PT LONG TERM GOAL #3   Title  Pt will have improved proximal hip strength to 4+/5 in order to further reduce LBP and maximize her ability to complete HH chores with greater ease.    Time  4    Period  Weeks    Status  On-going      PT LONG TERM GOAL #4   Title  Pt will report being able to stand and cook for 1+hours without increases in LBP or need for rest break to further demo improved core and functional strength and demo improved tolerance to standing.    Time  4    Period  Weeks    Status  On-going      PT LONG TERM GOAL #5   Title  Pt will report being able to sweep, mop, and vacuum for 1 hour before needing a break and without increases in LBP to further demo improved function and maximize her ability to complete HH chores with greater ease.     Time  4    Period  Weeks    Status  On-going      PT LONG TERM GOAL #6   Title  Pt will report being able to lift her grandchild up without increases and LBP and with proper form to demo improved core and functional strength and reduce her risk for reinjury with lifting.    Time  4    Period  Weeks    Status  On-going            Plan - 06/01/18 1521    Rehab Potential  Fair    PT Frequency  2x / week    PT Duration  4 weeks    PT Treatment/Interventions  ADLs/Self Care Home Management;Patient/family education;Manual techniques;Therapeutic activities;Therapeutic exercise;Traction;Aquatic Therapy;Cryotherapy;Electrical Stimulation;Moist Heat;Gait training;Stair training;Functional mobility training;Passive range of motion;Dry  needling;Taping;Spinal Manipulations;Joint Manipulations    PT Next Visit Plan  Begin instrucction for lifting from 12" to waist and waist to shoulder level.     PT Home Exercise Plan  isometrics,  scapular retraction, ; 03/28/2018 - upper trap, levator stretches; 12/24: LTR, supine figure 4 (pulling to opposite shoulder), SKTC, DTKC; 1/14:  added all 4 SLR     Consulted and Agree with Plan of Care  Patient       Patient will benefit from skilled therapeutic intervention in order to improve the following deficits and impairments:  Decreased activity tolerance, Decreased range of motion, Decreased strength, Increased muscle spasms, Impaired perceived functional ability, Impaired UE functional use, Pain, Abnormal gait, Decreased endurance, Hypomobility, Increased fascial restricitons, Postural dysfunction, Improper body mechanics, Decreased balance  Visit Diagnosis: Chronic bilateral low back pain without sciatica  Muscle weakness (generalized)     Problem List Patient Active Problem List   Diagnosis Date Noted  . Spondylosis without myelopathy or radiculopathy, cervical region 04/21/2018  . Neck pain 03/17/2018  . Chronic bilateral low back pain without sciatica 03/17/2018  . Encounter for screening colonoscopy 10/27/2017  . Failed conscious sedation during procedure 10/27/2017  . Chronic diarrhea 01/09/2015  . GERD (gastroesophageal reflux disease) 09/30/2010  . Constipation 09/30/2010    Virgina Organynthia Russell, PT CLT [phone] 06/01/2018, 3:22 PM  [facility] [address] [place], [place], [address] Phone: [phone]   Fax:  [phone]  Name: Heather Davidson MRN: [id number] Date of Birth: 06/07/1960

## 2018-06-06 ENCOUNTER — Ambulatory Visit (HOSPITAL_COMMUNITY): Payer: BLUE CROSS/BLUE SHIELD | Attending: Orthopaedic Surgery | Admitting: Physical Therapy

## 2018-06-06 DIAGNOSIS — M6281 Muscle weakness (generalized): Secondary | ICD-10-CM | POA: Diagnosis not present

## 2018-06-06 DIAGNOSIS — R29898 Other symptoms and signs involving the musculoskeletal system: Secondary | ICD-10-CM | POA: Insufficient documentation

## 2018-06-06 DIAGNOSIS — G8929 Other chronic pain: Secondary | ICD-10-CM | POA: Diagnosis not present

## 2018-06-06 DIAGNOSIS — M5412 Radiculopathy, cervical region: Secondary | ICD-10-CM | POA: Insufficient documentation

## 2018-06-06 DIAGNOSIS — M545 Low back pain: Secondary | ICD-10-CM | POA: Diagnosis not present

## 2018-06-06 NOTE — Therapy (Signed)
[place] [address] [place], [place], [address] Phone: [phone]   Fax:  [phone]  Physical Therapy Treatment  Patient Details  Name: Heather Davidson MRN: [id number] Date of Birth: 03/22/1961 Referring Provider (PT): Annell Greening, MD   Encounter Date: 06/06/2018  PT End of Session - 06/06/18 1639    Visit Number  14   Progress note completed 8th visit   Number of Visits  16    Date for PT Re-Evaluation  05/24/18    Authorization Type  BCBS     Authorization - Visit Number  14    Authorization - Number of Visits  16    PT Start Time  1120    PT Stop Time  1200    PT Time Calculation (min)  40 min    Activity Tolerance  Patient tolerated treatment well    Behavior During Therapy  [facility] for tasks assessed/performed       Past Medical History:  Diagnosis Date  . Anxiety   . Clostridium difficile colitis 12/2010  . Depression   . GERD (gastroesophageal reflux disease)   . HTN (hypertension)    physician stopped BP med  . Seasonal allergies     Past Surgical History:  Procedure Laterality Date  . ABDOMINAL HYSTERECTOMY    . CARPAL TUNNEL RELEASE Right 01/07/2018   Procedure: RIGHT CARPAL TUNNEL RELEASE;  Surgeon: Cindee Salt, MD;  Location: [facility];  Service: Orthopedics;  Laterality: Right;  FAB  . CERVICAL CONIZATION W/BX     for precervical cancer in remote past  . CESAREAN SECTION    . COLONOSCOPY  09/2007   anal papilla, normal colon and terminal ileum  . COLONOSCOPY WITH PROPOFOL N/A 02/10/2018   Procedure: COLONOSCOPY WITH PROPOFOL;  Surgeon: Corbin Ade, MD;  [facility];  Service: Endoscopy;  Laterality: N/A;  1:30pm  . complete hysterectomy    . ESOPHAGOGASTRODUODENOSCOPY  09/2007   solitary inlet patch (bx neg), antral erosions  . TUBAL LIGATION      There were no vitals filed for this visit.  Subjective Assessment - 06/06/18 1638    Subjective  pt reports no pain today.                        [facility] Adult PT Treatment/Exercise - 06/06/18 0001      Balance Poses: Yoga   Warrior I  1 rep;60 seconds  (Pended)     Warrior II  1 rep;60 seconds  (Pended)       Neck Exercises: Paediatric nurse for Strengthening  leg press 5 pl 10 x 2 reps   (Pended)       Lumbar Exercises: Stretches   Figure 4 Stretch  3 reps;30 seconds  (Pended)       Lumbar Exercises: Standing   Wall Slides  15 reps  (Pended)     Shoulder Adduction Limitations  lifting floor-wst, wst-shoulder 4 X each  (Pended)     Other Standing Lumbar Exercises  Tandem on foam with Pallof GTB 10 reps each side, 40 reps total  (Pended)     Other Standing Lumbar Exercises  wall lift off with red t-band x 15 .wall push up x 15.   (Pended)       Lumbar Exercises: Seated   Sit to Stand  20 reps  (Pended)    10 reps with each LE leading  Lumbar Exercises: Prone   Other Prone Lumbar Exercises  fire hydrant 15   (Pended)       Lumbar Exercises: Quadruped   [place]/Old Horse  5 reps  (Pended)     Opposite Arm/Leg Raise  Right arm/Left leg;Left arm/Right leg;10 reps  (Pended)     Opposite Arm/Leg Raise Limitations  red t band   (Pended)                PT Short Term Goals - 05/10/18 1110      PT SHORT TERM GOAL #1   Title  PT to be I in HEP to improve cervical and scapular strength to improve tolerance of functional use of pt UE's.     Time  2    Period  Weeks    Status  Achieved      PT SHORT TERM GOAL #2   Title  PT to cervical strength to be 4/5 to allow pt pain to be decreased to no greater than a 6/10 to allow pt to be able to rest better.     Baseline  12/17: cervical strength 4/5; pain has been averaging about a 6/10 over the last 1-2 weeks    Time  2    Period  Weeks    Status  Achieved      PT SHORT TERM GOAL #3   Title  PT to be able to complete light activty, ie folding clothes, for 20 minutes without having increased pain.      Baseline  12/17: still having trouble with this     Time  2    Period  Weeks    Status  On-going      PT SHORT TERM GOAL #4   Title  Pt will have improved lumbar ROM to [facility] and without pain in order to demo reduced soft tissue restrictions and improved joint mobility.     Time  2    Period  Weeks    Status  On-going      PT SHORT TERM GOAL #5   Title  Pt will have improved proximal hip strength to 4/5 in order to reduce LBP.    Time  2    Period  Weeks    Status  On-going      PT SHORT TERM GOAL #6   Title  Pt will report being able to cook for 45 mins without increases in LBP to demo improved functional strength and overall function at home.     Time  2    Period  Weeks    Status  New      PT SHORT TERM GOAL #7   Title  Pt will report being able to sweep, mop, and vacuum for 30 mins before needing a rest break to demo improved core and functional strength and tolerance to flexion ROM.     Time  2    Period  Weeks    Status  New        PT Long Term Goals - 05/10/18 1111      PT LONG TERM GOAL #1   Title  Pt cervical strength to be at least a 4+/5 to allow pt pain level to be decreased by no greater than a 4/10 to allow pt to be able to complete light housework tasks for 30 minutes or greater.     Baseline  12/17: 4/5 cervical MMT; pain has been averaging about a 6/10 over the last 1-2 weeks; still  having difficulty completing light house work    Time  4    Period  Weeks    Status  On-going      PT LONG TERM GOAL #2   Title  PT to be able to stablilze her cervical spine while putting dishes and groceries up to allow pt to complete these tasks without experiencing increaed pain.     Baseline  12/17: no pain with this    Time  4    Period  Weeks    Status  Achieved      PT LONG TERM GOAL #3   Title  Pt will have improved proximal hip strength to 4+/5 in order to further reduce LBP and maximize her ability to complete HH chores with greater ease.    Time  4    Period   Weeks    Status  On-going      PT LONG TERM GOAL #4   Title  Pt will report being able to stand and cook for 1+hours without increases in LBP or need for rest break to further demo improved core and functional strength and demo improved tolerance to standing.    Time  4    Period  Weeks    Status  On-going      PT LONG TERM GOAL #5   Title  Pt will report being able to sweep, mop, and vacuum for 1 hour before needing a break and without increases in LBP to further demo improved function and maximize her ability to complete HH chores with greater ease.     Time  4    Period  Weeks    Status  On-going      PT LONG TERM GOAL #6   Title  Pt will report being able to lift her grandchild up without increases and LBP and with proper form to demo improved core and functional strength and reduce her risk for reinjury with lifting.    Time  4    Period  Weeks    Status  On-going            Plan - 06/06/18 1641    Clinical Impression Statement  conitued with focus on improving LE stability, functional strength and ovearall body mechananics.  Pt with tendency to round back and not utilize deeper squat when retrieving item off floor.   Requires additional instruction with carrying and keeping load close to BOS.  Pt able to complete all other exercises with minimal cues today.     Rehab Potential  Fair    PT Frequency  2x / week    PT Duration  4 weeks    PT Treatment/Interventions  ADLs/Self Care Home Management;Patient/family education;Manual techniques;Therapeutic activities;Therapeutic exercise;Traction;Aquatic Therapy;Cryotherapy;Electrical Stimulation;Moist Heat;Gait training;Stair training;Functional mobility training;Passive range of motion;Dry needling;Taping;Spinal Manipulations;Joint Manipulations    PT Next Visit Plan  Continue high level stability exercises.  Continue to review functional body mechanics/lifting.     PT Home Exercise Plan  isometrics, scapular retraction, ;  03/28/2018 - upper trap, levator stretches; 12/24: LTR, supine figure 4 (pulling to opposite shoulder), SKTC, DTKC; 1/14:  added all 4 SLR     Consulted and Agree with Plan of Care  Patient       Patient will benefit from skilled therapeutic intervention in order to improve the following deficits and impairments:  Decreased activity tolerance, Decreased range of motion, Decreased strength, Increased muscle spasms, Impaired perceived functional ability, Impaired UE functional use, Pain, Abnormal gait, Decreased  endurance, Hypomobility, Increased fascial restricitons, Postural dysfunction, Improper body mechanics, Decreased balance  Visit Diagnosis: Chronic bilateral low back pain without sciatica  Muscle weakness (generalized)     Problem List Patient Active Problem List   Diagnosis Date Noted  . Spondylosis without myelopathy or radiculopathy, cervical region 04/21/2018  . Neck pain 03/17/2018  . Chronic bilateral low back pain without sciatica 03/17/2018  . Encounter for screening colonoscopy 10/27/2017  . Failed conscious sedation during procedure 10/27/2017  . Chronic diarrhea 01/09/2015  . GERD (gastroesophageal reflux disease) 09/30/2010  . Constipation 09/30/2010   Lurena NidaAmy B Odalis Jordan, PTA/CLT [phone]  Lurena NidaFrazier, [place] B 06/06/2018, 4:45 PM  [place] [address] [place], [place], [address] Phone: [phone]   Fax:  [phone]  Name: Mayer CamelJo Marie Kallal MRN: [id number] Date of Birth: 08/06/1960

## 2018-06-06 NOTE — Addendum Note (Signed)
Addended by: Bella Kennedy on: 06/06/2018 08:57 AM   Modules accepted: Orders

## 2018-06-09 ENCOUNTER — Encounter (HOSPITAL_COMMUNITY): Payer: Self-pay | Admitting: Physical Therapy

## 2018-06-09 ENCOUNTER — Ambulatory Visit (HOSPITAL_COMMUNITY): Payer: BLUE CROSS/BLUE SHIELD | Admitting: Physical Therapy

## 2018-06-09 DIAGNOSIS — M5412 Radiculopathy, cervical region: Secondary | ICD-10-CM | POA: Diagnosis not present

## 2018-06-09 DIAGNOSIS — M6281 Muscle weakness (generalized): Secondary | ICD-10-CM | POA: Diagnosis not present

## 2018-06-09 DIAGNOSIS — R29898 Other symptoms and signs involving the musculoskeletal system: Secondary | ICD-10-CM

## 2018-06-09 DIAGNOSIS — M545 Low back pain: Principal | ICD-10-CM

## 2018-06-09 DIAGNOSIS — G8929 Other chronic pain: Secondary | ICD-10-CM

## 2018-06-09 NOTE — Therapy (Signed)
[facility] [address] [place], [place], [address] Phone: [phone]   Fax:  [phone]   PHYSICAL THERAPY DISCHARGE SUMMARY  Visits from Start of Care: 15  Current functional level related to goals / functional outcomes: See below   Remaining deficits: See below   Education / Equipment: HEP  Plan: Patient agrees to discharge.  Patient goals were partially met. Patient is being discharged due to the patient's request.  ?????     Geraldine Solar PT, DPT   Physical Therapy Treatment  Patient Details  Name: Heather Davidson MRN: [id number] Date of Birth: 1960-07-30 Referring Provider (PT): Rodell Perna, MD   Encounter Date: 06/09/2018  PT End of Session - 06/09/18 1431    Visit Number  15   Progress note completed 8th visit   Number of Visits  16    Date for PT Re-Evaluation  05/24/18    Authorization Type  BCBS     Authorization - Visit Number  15    Authorization - Number of Visits  16    PT Start Time  [phone]    PT Stop Time  1033    PT Time Calculation (min)  38 min    Activity Tolerance  Patient tolerated treatment well    Behavior During Therapy  [facility] for tasks assessed/performed       Past Medical History:  Diagnosis Date  . Anxiety   . Clostridium difficile colitis 12/2010  . Depression   . GERD (gastroesophageal reflux disease)   . HTN (hypertension)    physician stopped BP med  . Seasonal allergies     Past Surgical History:  Procedure Laterality Date  . ABDOMINAL HYSTERECTOMY    . CARPAL TUNNEL RELEASE Right 01/07/2018   Procedure: RIGHT CARPAL TUNNEL RELEASE;  Surgeon: Daryll Brod, MD;  Location: [place];  Service: Orthopedics;  Laterality: Right;  FAB  . CERVICAL CONIZATION W/BX     for precervical cancer in remote past  . CESAREAN SECTION    . COLONOSCOPY  09/2007   anal papilla, normal colon and terminal ileum  . COLONOSCOPY WITH PROPOFOL N/A 02/10/2018   Procedure: COLONOSCOPY  WITH PROPOFOL;  Surgeon: Daneil Dolin, MD;  [facility];  Service: Endoscopy;  Laterality: N/A;  1:30pm  . complete hysterectomy    . ESOPHAGOGASTRODUODENOSCOPY  09/2007   solitary inlet patch (bx neg), antral erosions  . TUBAL LIGATION      There were no vitals filed for this visit.      [facility] PT Assessment - 06/09/18 0001      Assessment   Medical Diagnosis  LBP    Referring Provider (PT)  Rodell Perna, MD      Observation/Other Assessments   Focus on Therapeutic Outcomes (FOTO)   48% limited (was 68%)      AROM   Lumbar Flexion  WNL    Lumbar Extension  WNL    Lumbar - Right Side Bend  WNL    Lumbar - Left Side Bend  WNL    Lumbar - Right Rotation  WNL    Lumbar - Left Rotation  5% limited      Strength   Right Hip Flexion  5/5    Right Hip Extension  5/5    Right Hip ABduction  5/5    Left Hip Flexion  5/5    Left Hip Extension  5/5    Left Hip ABduction  5/5  Right Knee Flexion  5/5    Right Knee Extension  5/5    Left Knee Flexion  5/5    Left Knee Extension  5/5    Right Ankle Dorsiflexion  5/5    Left Ankle Dorsiflexion  5/5      Static Standing Balance   Static Standing Balance -  Activities   Single Leg Stance - Right Leg;Single Leg Stance - Left Leg    Static Standing - Comment/# of Minutes  30" both      Standardized Balance Assessment   Standardized Balance Assessment  Five Times Sit to Stand    Five times sit to stand comments   10.11 sec no UE's   was 13.8 sec                          PT Education - 06/09/18 1436    Education Details  encouraged to increase walking time/activity at home; to continue exercises given.    Person(s) Educated  Patient    Methods  Explanation    Comprehension  Verbalized understanding       PT Short Term Goals - 06/09/18 1007      PT SHORT TERM GOAL #1   Title  PT to be I in HEP to improve cervical and scapular strength to improve tolerance of functional use of pt UE's.      Time  2    Period  Weeks    Status  Achieved      PT SHORT TERM GOAL #2   Title  PT to cervical strength to be 4/5 to allow pt pain to be decreased to no greater than a 6/10 to allow pt to be able to rest better.     Baseline  12/17: cervical strength 4/5; pain has been averaging about a 6/10 over the last 1-2 weeks    Time  2    Period  Weeks    Status  Achieved      PT SHORT TERM GOAL #3   Title  PT to be able to complete light activty, ie folding clothes, for 20 minutes without having increased pain.     Baseline  12/17: still having trouble with this     Time  2    Period  Weeks    Status  Achieved      PT SHORT TERM GOAL #4   Title  Pt will have improved lumbar ROM to [facility] and without pain in order to demo reduced soft tissue restrictions and improved joint mobility.     Time  2    Period  Weeks    Status  Achieved      PT SHORT TERM GOAL #5   Title  Pt will have improved proximal hip strength to 4/5 in order to reduce LBP.    Time  2    Period  Weeks    Status  Achieved      PT SHORT TERM GOAL #6   Title  Pt will report being able to cook for 45 mins without increases in LBP to demo improved functional strength and overall function at home.     Baseline  2/6:  pt has not attemtped this yet but feels she can complete this.    Time  2    Period  Weeks    Status  Achieved      PT SHORT TERM GOAL #7   Title  Pt will  report being able to sweep, mop, and vacuum for 30 mins before needing a rest break to demo improved core and functional strength and tolerance to flexion ROM.     Baseline  2/6:  Pt can completed with rest breaks/stretches between    Time  2    Period  Weeks    Status  On-going        PT Long Term Goals - 06/09/18 1010      PT LONG TERM GOAL #1   Title  Pt cervical strength to be at least a 4+/5 to allow pt pain level to be decreased by no greater than a 4/10 to allow pt to be able to complete light housework tasks for 30 minutes or greater.      Baseline  12/17: 4/5 cervical MMT; pain has been averaging about a 6/10 over the last 1-2 weeks; still having difficulty completing light house work    Time  4    Period  Weeks    Status  On-going      PT LONG TERM GOAL #2   Title  PT to be able to stablilze her cervical spine while putting dishes and groceries up to allow pt to complete these tasks without experiencing increaed pain.     Baseline  12/17: no pain with this    Time  4    Period  Weeks    Status  Achieved      PT LONG TERM GOAL #3   Title  Pt will have improved proximal hip strength to 4+/5 in order to further reduce LBP and maximize her ability to complete HH chores with greater ease.    Time  4    Period  Weeks    Status  On-going      PT LONG TERM GOAL #4   Title  Pt will report being able to stand and cook for 1+hours without increases in LBP or need for rest break to further demo improved core and functional strength and demo improved tolerance to standing.    Time  4    Period  Weeks    Status  On-going      PT LONG TERM GOAL #5   Title  Pt will report being able to sweep, mop, and vacuum for 1 hour before needing a break and without increases in LBP to further demo improved function and maximize her ability to complete [place] chores with greater ease.     Time  4    Period  Weeks    Status  On-going      PT LONG TERM GOAL #6   Title  Pt will report being able to lift her grandchild up without increases and LBP and with proper form to demo improved core and functional strength and reduce her risk for reinjury with lifting.    Time  4    Period  Weeks    Status  On-going            Plan - 06/09/18 1431    Clinical Impression Statement  Pt requests for today to be her last day of therapy.  Testing reveals overall improvement in all areas.  Pt continues to be limited in her functional ability around the house, i.e. housework, however, states she has not pushed herself.  Pt reports significant improvement with  lower back but still with occasional cervical pain.  Pt has met all goals at this time and feels she is ready to continue independently.  Rehab Potential  Fair    PT Frequency  2x / week    PT Duration  4 weeks    PT Treatment/Interventions  ADLs/Self Care Home Management;Patient/family education;Manual techniques;Therapeutic activities;Therapeutic exercise;Traction;Aquatic Therapy;Cryotherapy;Electrical Stimulation;Moist Heat;Gait training;Stair training;Functional mobility training;Passive range of motion;Dry needling;Taping;Spinal Manipulations;Joint Manipulations    PT Next Visit Plan  discharge per patient request and goals met.     PT Home Exercise Plan  isometrics, scapular retraction, ; 03/28/2018 - upper trap, levator stretches; 12/24: LTR, supine figure 4 (pulling to opposite shoulder), SKTC, DTKC; 1/14:  added all 4 SLR     Consulted and Agree with Plan of Care  Patient       Patient will benefit from skilled therapeutic intervention in order to improve the following deficits and impairments:  Decreased activity tolerance, Decreased range of motion, Decreased strength, Increased muscle spasms, Impaired perceived functional ability, Impaired UE functional use, Pain, Abnormal gait, Decreased endurance, Hypomobility, Increased fascial restricitons, Postural dysfunction, Improper body mechanics, Decreased balance  Visit Diagnosis: Chronic bilateral low back pain without sciatica  Muscle weakness (generalized)  Other symptoms and signs involving the musculoskeletal system  Radiculopathy, cervical region     Problem List Patient Active Problem List   Diagnosis Date Noted  . Spondylosis without myelopathy or radiculopathy, cervical region 04/21/2018  . Neck pain 03/17/2018  . Chronic bilateral low back pain without sciatica 03/17/2018  . Encounter for screening colonoscopy 10/27/2017  . Failed conscious sedation during procedure 10/27/2017  . Chronic diarrhea 01/09/2015  .  GERD (gastroesophageal reflux disease) 09/30/2010  . Constipation 09/30/2010   Teena Irani, PTA/CLT [phone]  Teena Irani 06/09/2018, 2:37 PM  [facility] [address] [place], [place], [address] Phone: [phone]   Fax:  [phone]  Name: Heather Davidson MRN: [id number] Date of Birth: November 15, 1960

## 2018-06-14 ENCOUNTER — Encounter (HOSPITAL_COMMUNITY): Payer: BLUE CROSS/BLUE SHIELD

## 2018-07-11 IMAGING — CT CT CHEST LUNG CANCER SCREENING LOW DOSE W/O CM
1 of 2 series · 10 of 40 positions shown, 13 images · non-contrast
Comparison: Chest radiograph 12/01/2012.  No prior CT.

CLINICAL DATA: Thirty pack-year smoking history.  Asymptomatic.

EXAM:
CT CHEST WITHOUT CONTRAST LOW-DOSE FOR LUNG CANCER SCREENING
TECHNIQUE: Multidetector CT imaging of the chest was performed following the
standard protocol without IV contrast.

[ct lung segmentation data · axial · 0.71mm/px · z∈[+829,+829]mm · 10 of 290 frames shown]
[frame 1/290  mediastinal]
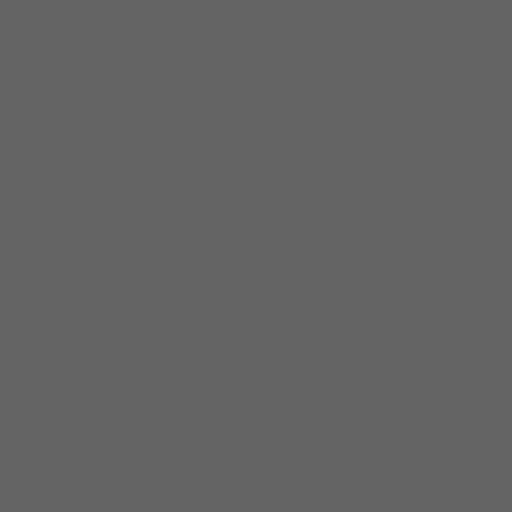
[frame 1/290  lung]
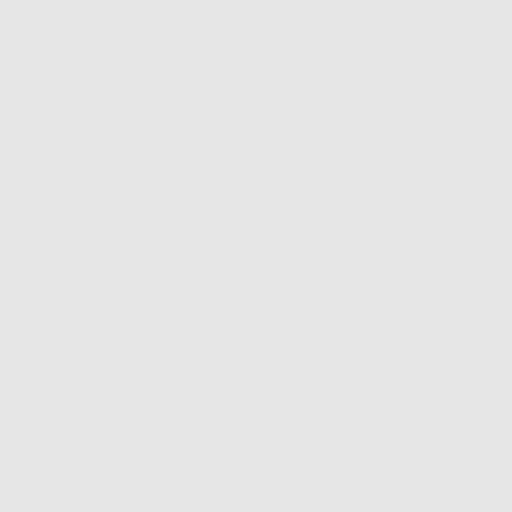
[frame 33/290  lung]
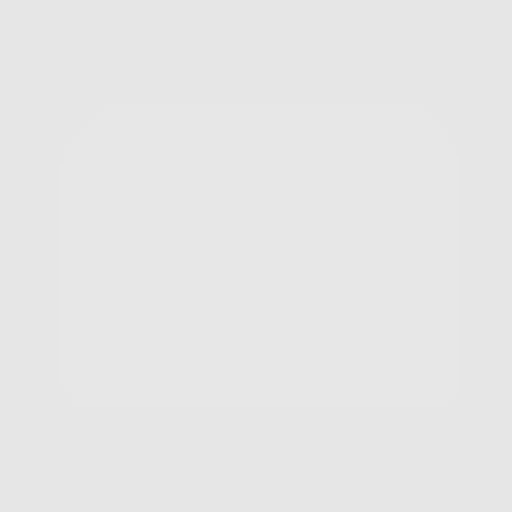
[frame 65/290  lung]
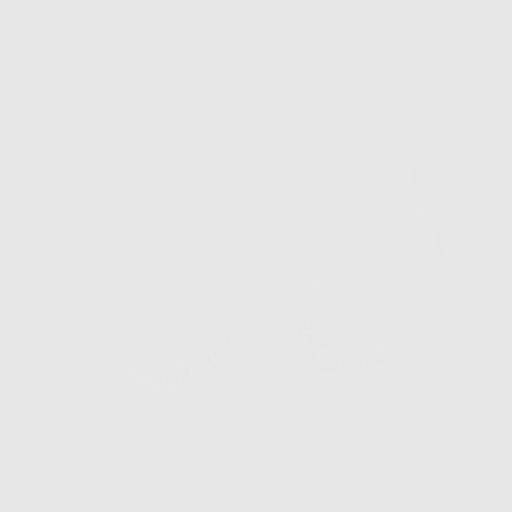
[frame 97/290  lung]
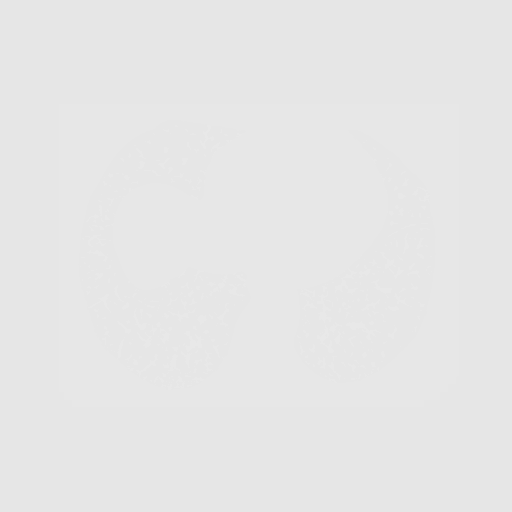
[frame 129/290  mediastinal]
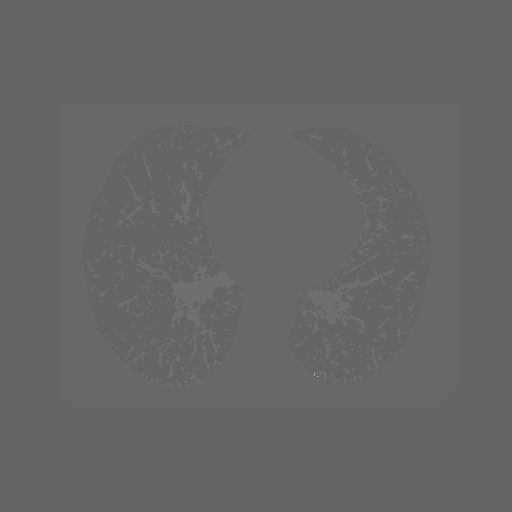
[frame 129/290  lung]
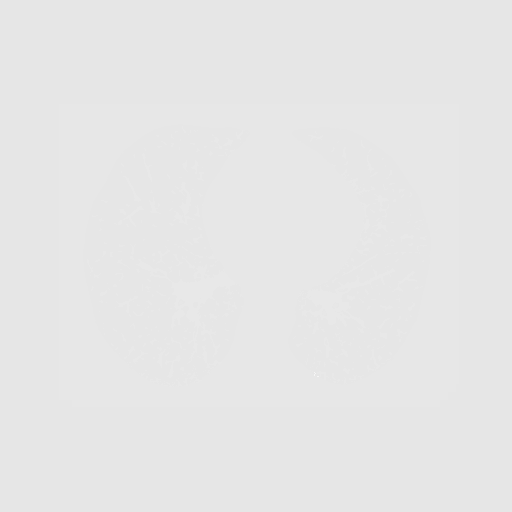
[frame 161/290  lung]
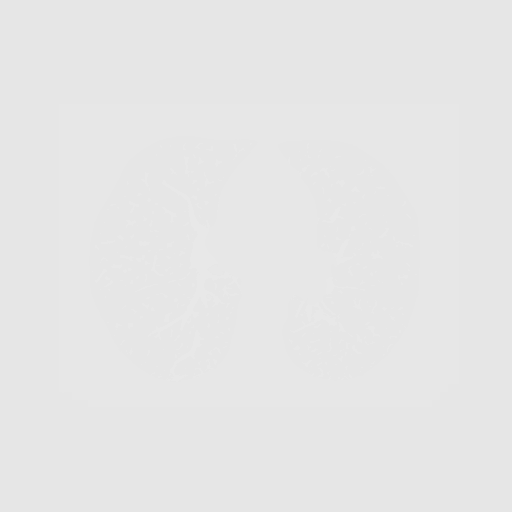
[frame 193/290  lung]
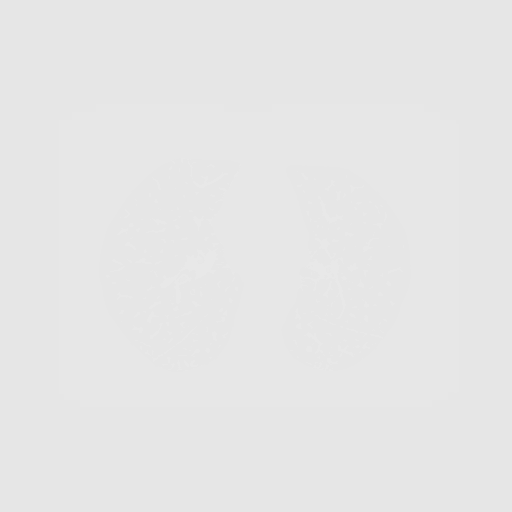
[frame 225/290  lung]
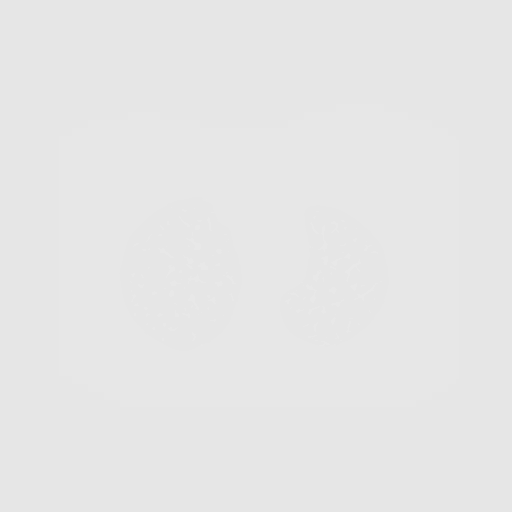
[frame 257/290  mediastinal]
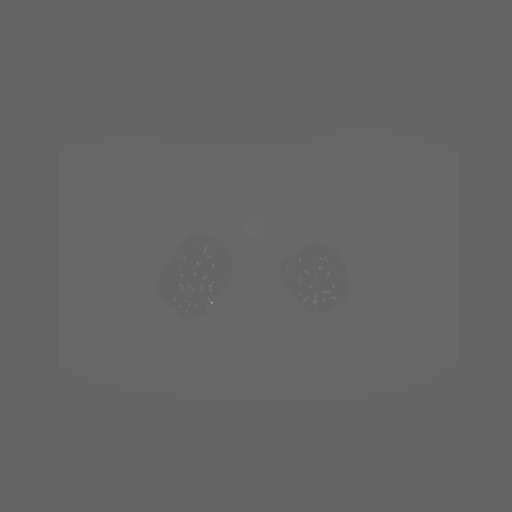
[frame 257/290  lung]
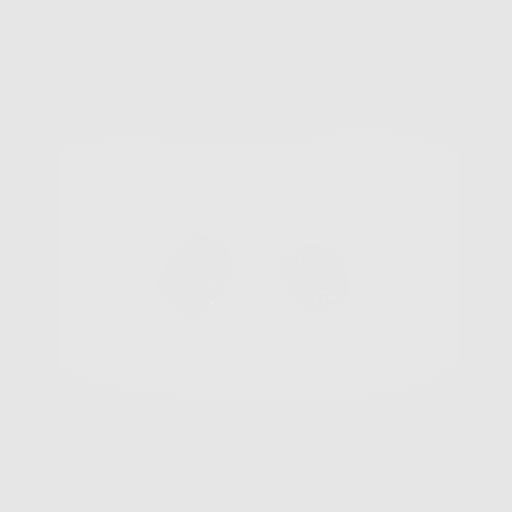
[frame 290/290  lung]
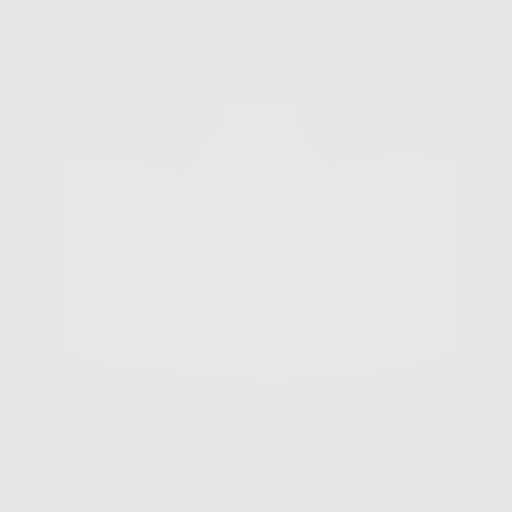

[10 of 40 positions shown; findings below may reference images not displayed]

FINDINGS: Cardiovascular: Aberrant right subclavian artery, traversing
posterior to the esophagus. Normal heart size, without pericardial
effusion. Lad coronary artery atherosclerosis on image 30/series 2.

Mediastinum/Nodes: No mediastinal or definite hilar adenopathy,
given limitations of unenhanced CT.

Lungs/Pleura: No pleural fluid. Mild centrilobular emphysema.
Minimal subpleural reticulation identified within the anterior upper
lobes bilaterally and the medial left upper lobe. No suspicious
pulmonary nodule or mass.

Upper Abdomen: Normal imaged portions of the liver, spleen, stomach,
pancreas, adrenal glands, kidneys.

Musculoskeletal: Remote anterior right rib fractures.
IMPRESSION: 1. Lung-RADS 1S, negative. Continue annual screening with low-dose
chest CT without contrast in 12 months.
2. The "S" modifier represents a potentially clinically significant
non pulmonary finding. Age advanced coronary artery atherosclerosis.
Recommend assessment of coronary risk factors and consideration of
medical therapy.
3. Suspicion of minimal interstitial lung disease, as evidenced by
upper lobe subpleural reticulation. Recommend attention on
follow-up.
4. Emphysema ([id number]).

## 2018-07-15 DIAGNOSIS — E785 Hyperlipidemia, unspecified: Secondary | ICD-10-CM | POA: Diagnosis not present

## 2018-07-15 DIAGNOSIS — Z79899 Other long term (current) drug therapy: Secondary | ICD-10-CM | POA: Diagnosis not present

## 2018-10-11 DIAGNOSIS — Z01419 Encounter for gynecological examination (general) (routine) without abnormal findings: Secondary | ICD-10-CM | POA: Diagnosis not present

## 2018-10-11 DIAGNOSIS — I10 Essential (primary) hypertension: Secondary | ICD-10-CM | POA: Insufficient documentation

## 2018-10-11 DIAGNOSIS — Z1231 Encounter for screening mammogram for malignant neoplasm of breast: Secondary | ICD-10-CM | POA: Diagnosis not present

## 2018-10-11 DIAGNOSIS — K589 Irritable bowel syndrome without diarrhea: Secondary | ICD-10-CM | POA: Insufficient documentation

## 2018-10-11 DIAGNOSIS — Z13820 Encounter for screening for osteoporosis: Secondary | ICD-10-CM | POA: Diagnosis not present

## 2018-10-11 DIAGNOSIS — M1990 Unspecified osteoarthritis, unspecified site: Secondary | ICD-10-CM | POA: Insufficient documentation

## 2018-10-11 DIAGNOSIS — Z6825 Body mass index (BMI) 25.0-25.9, adult: Secondary | ICD-10-CM | POA: Diagnosis not present

## 2018-10-11 DIAGNOSIS — G43909 Migraine, unspecified, not intractable, without status migrainosus: Secondary | ICD-10-CM | POA: Insufficient documentation

## 2018-10-11 DIAGNOSIS — L989 Disorder of the skin and subcutaneous tissue, unspecified: Secondary | ICD-10-CM | POA: Insufficient documentation

## 2018-10-11 DIAGNOSIS — N390 Urinary tract infection, site not specified: Secondary | ICD-10-CM | POA: Insufficient documentation

## 2018-10-13 DIAGNOSIS — M79604 Pain in right leg: Secondary | ICD-10-CM | POA: Diagnosis not present

## 2018-10-13 DIAGNOSIS — I10 Essential (primary) hypertension: Secondary | ICD-10-CM | POA: Diagnosis not present

## 2018-10-24 ENCOUNTER — Ambulatory Visit: Payer: BC Managed Care – PPO

## 2018-10-24 ENCOUNTER — Ambulatory Visit: Payer: BC Managed Care – PPO | Admitting: Orthopedic Surgery

## 2018-10-24 ENCOUNTER — Encounter: Payer: Self-pay | Admitting: Orthopedic Surgery

## 2018-10-24 ENCOUNTER — Other Ambulatory Visit: Payer: Self-pay

## 2018-10-24 ENCOUNTER — Ambulatory Visit (INDEPENDENT_AMBULATORY_CARE_PROVIDER_SITE_OTHER): Payer: BC Managed Care – PPO

## 2018-10-24 VITALS — BP 148/80 | HR 93 | Temp 96.6°F | Ht 66.0 in | Wt 160.0 lb

## 2018-10-24 DIAGNOSIS — M79671 Pain in right foot: Secondary | ICD-10-CM | POA: Diagnosis not present

## 2018-10-24 DIAGNOSIS — G8929 Other chronic pain: Secondary | ICD-10-CM | POA: Diagnosis not present

## 2018-10-24 DIAGNOSIS — F17200 Nicotine dependence, unspecified, uncomplicated: Secondary | ICD-10-CM

## 2018-10-24 DIAGNOSIS — M2241 Chondromalacia patellae, right knee: Secondary | ICD-10-CM | POA: Diagnosis not present

## 2018-10-24 DIAGNOSIS — M23321 Other meniscus derangements, posterior horn of medial meniscus, right knee: Secondary | ICD-10-CM

## 2018-10-24 DIAGNOSIS — M1710 Unilateral primary osteoarthritis, unspecified knee: Secondary | ICD-10-CM

## 2018-10-24 DIAGNOSIS — M25561 Pain in right knee: Secondary | ICD-10-CM | POA: Diagnosis not present

## 2018-10-24 DIAGNOSIS — M76821 Posterior tibial tendinitis, right leg: Secondary | ICD-10-CM

## 2018-10-24 MED ORDER — DICLOFENAC POTASSIUM 50 MG PO TABS: 50.0000 mg | ORAL_TABLET | Freq: Two times a day (BID) | ORAL | 3 refills | Status: DC

## 2018-10-24 NOTE — Progress Notes (Addendum)
NEW PROBLEM OFFICE VISIT  Chief Complaint  Patient presents with  . Knee Pain    Rt knee   . Ankle Pain    Bilat ankle right worse.     58 year old female complains of right knee pain bilateral ankle pain right worse than left.  She sprained her ankle and fractured a bone in her ankle 10 years ago.  [facility] her with a Aircast and nonoperative care she says her ankle is starting to hurt again primarily on the medial side of the joint posterior to the medial malleolus  She also has some intermittent giving way locking episodes right knee while walking, no history of trauma associated with mild to moderate nonradiating pain along the medial joint line     Review of Systems  Constitutional: Positive for diaphoresis.  Eyes: Positive for redness.  Respiratory: Positive for cough.   Cardiovascular: Positive for leg swelling.  Gastrointestinal: Positive for constipation.  Genitourinary: Positive for frequency, hematuria and urgency.  Musculoskeletal: Positive for back pain, falls, joint pain, myalgias and neck pain.  Neurological: Positive for tingling and headaches.  Endo/Heme/Allergies: Positive for environmental allergies and polydipsia. Bruises/bleeds easily.  All other systems reviewed and are negative.    Past Medical History:  Diagnosis Date  . Anxiety   . Clostridium difficile colitis 12/2010  . Depression   . GERD (gastroesophageal reflux disease)   . HTN (hypertension)    physician stopped BP med  . Seasonal allergies     Past Surgical History:  Procedure Laterality Date  . ABDOMINAL HYSTERECTOMY    . CARPAL TUNNEL RELEASE Right 01/07/2018   Procedure: RIGHT CARPAL TUNNEL RELEASE;  Surgeon: Cindee SaltKuzma, Gary, MD;  Location: [facility];  Service: Orthopedics;  Laterality: Right;  FAB  . CERVICAL CONIZATION W/BX     for precervical cancer in remote past  . CESAREAN SECTION    . COLONOSCOPY  09/2007   anal papilla, normal colon and  terminal ileum  . COLONOSCOPY WITH PROPOFOL N/A 02/10/2018   Procedure: COLONOSCOPY WITH PROPOFOL;  Surgeon: Corbin Adeourk, Robert M, MD;  Location: [facility];  Service: Endoscopy;  Laterality: N/A;  1:30pm  . complete hysterectomy    . ESOPHAGOGASTRODUODENOSCOPY  09/2007   solitary inlet patch (bx neg), antral erosions  . TUBAL LIGATION      Family History  Problem Relation Age of Onset  . Lung cancer Father        asbestosis  . COPD Mother        Died 7895  . Colon cancer Neg Hx    Social History   Tobacco Use  . Smoking status: Current Every Day Smoker    Packs/day: 1.00    Years: 25.00    Pack years: 25.00    Types: Cigarettes  . Smokeless tobacco: Never Used  Substance Use Topics  . Alcohol use: Yes    Alcohol/week: 0.0 standard drinks    Comment: 4 glass of red wine a night   . Drug use: No    Allergies  Allergen Reactions  . Ciprofloxacin Nausea And Vomiting    Current Meds  Medication Sig  . amLODipine (NORVASC) 2.5 MG tablet Take 2.5 mg by mouth daily.  Marland Kitchen. aspirin 81 MG tablet Take 81 mg by mouth daily.   Marland Kitchen. atorvastatin (LIPITOR) 20 MG tablet Take 40 mg by mouth daily.   . B Complex-C-Folic Acid (SUPER B COMPLEX/FA/VIT C) TABS Take 1 tablet by mouth daily.   .Marland Kitchen  docusate sodium (COLACE) 100 MG capsule Take 100 mg by mouth daily.  . eszopiclone (LUNESTA) 2 MG TABS tablet Take 2 mg by mouth at bedtime. Take immediately before bedtime  . levocetirizine (XYZAL) 5 MG tablet Take 5 mg by mouth daily.  Marland Kitchen losartan (COZAAR) 100 MG tablet Take 100 mg by mouth daily.    . methocarbamol (ROBAXIN) 500 MG tablet Take 500 mg by mouth 4 (four) times daily.  . montelukast (SINGULAIR) 10 MG tablet Take 10 mg by mouth at bedtime.   . Multiple Vitamin (MULTIVITAMIN) capsule Take 1 capsule by mouth daily.  Marland Kitchen omeprazole (PRILOSEC OTC) 20 MG tablet Take 20 mg by mouth daily.  . polyethylene glycol-electrolytes (TRILYTE) 420 g solution Take 4,000 mLs by mouth as directed.  . Probiotic  Product (REZYST IM) CHEW Chew 1 tablet by mouth daily.  . progesterone (PROMETRIUM) 100 MG capsule Take 100 mg by mouth daily.   . RESTASIS 0.05 % ophthalmic emulsion Place 1 drop into both eyes 2 (two) times daily.  Marland Kitchen topiramate (TOPAMAX) 25 MG capsule Take 25 mg by mouth daily.   . Turmeric 1053 MG TABS Take 1,053 mg by mouth daily.   . vitamin B-12 (CYANOCOBALAMIN) 1000 MCG tablet Take 1,000 mcg by mouth daily.    BP (!) 148/80   Pulse 93   Temp (!) 96.6 F (35.9 C)   Ht 5\' 6"  (1.676 m)   Wt 160 lb (72.6 kg)   BMI 25.82 kg/m   Physical Exam Vitals signs and nursing note reviewed.  Constitutional:      Appearance: Normal appearance.  Neurological:     Mental Status: She is alert and oriented to person, place, and time.     Gait: Gait is intact.  Psychiatric:        Mood and Affect: Mood normal.     Ortho Exam  Left knee normal alignment.  No tenderness.  Mild crepitus in the patellofemoral articulation full range of motion ligaments are stable strength and muscle tone are normal.  Skin is clean dry and intact without nodularity laceration ulceration or erythema.  Neurovascular exam is intact distally  Right knee tenderness in the posterior medial joint line crepitance in the patellofemoral joint no effusion full range of motion ligaments are stable strength and muscle tone are normal skin is clean dry and intact without nodularity laceration ulceration or erythema neurovascular damage intact  I could not get a pain response from the knee McMurray's maneuver or Apley's maneuver.  Right foot tenderness posterior tibial tendon no swelling mild tenderness in the plantar aspect of the foot mild flexible pes planus with weakness on single-leg heel rise    MEDICAL DECISION SECTION  Xrays were done at [facility] see report  My independent reading of xrays:  Right knee mild osteoarthritis medial compartment best seen on the lateral compartment x-ray  Right foot  normal Encounter Diagnoses  Name Primary?  . Posterior tibial tendon dysfunction (PTTD) of right lower extremity   . Patellofemoral chondrosis of right knee Yes  . Primary localized osteoarthritis of knee right    . Derangement of posterior horn of medial meniscus of right knee   . Smoking     PLAN: (Rx., injectx, surgery, frx, mri/ct) Ankle warm-and-form orthotics Patellofemoral pain exercise program at home Education on posterior tibial tendon dysfunction Smoking cessation counseling Diclofenac 50 twice a day Follow-up 4 weeks  Meds ordered this encounter  Medications  . diclofenac (CATAFLAM) 50 MG tablet  Sig: Take 1 tablet (50 mg total) by mouth 2 (two) times daily.    Dispense:  90 tablet    Refill:  3    Fuller CanadaStanley Odie Edmonds, MD  10/24/2018 11:05 AM

## 2018-10-24 NOTE — Patient Instructions (Signed)
Steps to Quit Smoking  Smoking tobacco can be harmful to your health and can affect almost every organ in your body. Smoking puts you, and those around you, at risk for developing many serious chronic diseases. Quitting smoking is difficult, but it is one of the best things that you can do for your health. It is never too late to quit. What are the benefits of quitting smoking? When you quit smoking, you lower your risk of developing serious diseases and conditions, such as:  Lung cancer or lung disease, such as COPD.  Heart disease.  Stroke.  Heart attack.  Infertility.  Osteoporosis and bone fractures. Additionally, symptoms such as coughing, wheezing, and shortness of breath may get better when you quit. You may also find that you get sick less often because your body is stronger at fighting off colds and infections. If you are pregnant, quitting smoking can help to reduce your chances of having a baby of low birth weight. How do I get ready to quit? When you decide to quit smoking, create a plan to make sure that you are successful. Before you quit:  Pick a date to quit. Set a date within the next two weeks to give you time to prepare.  Write down the reasons why you are quitting. Keep this list in places where you will see it often, such as on your bathroom mirror or in your car or wallet.  Identify the people, places, things, and activities that make you want to smoke (triggers) and avoid them. Make sure to take these actions: ? Throw away all cigarettes at home, at work, and in your car. ? Throw away smoking accessories, such as ashtrays and lighters. ? Clean your car and make sure to empty the ashtray. ? Clean your home, including curtains and carpets.  Tell your family, friends, and coworkers that you are quitting. Support from your loved ones can make quitting easier.  Talk with your health care provider about your options for quitting smoking.  Find out what treatment  options are covered by your health insurance. What strategies can I use to quit smoking? Talk with your healthcare provider about different strategies to quit smoking. Some strategies include:  Quitting smoking altogether instead of gradually lessening how much you smoke over a period of time. Research shows that quitting "cold turkey" is more successful than gradually quitting.  Attending in-person counseling to help you build problem-solving skills. You are more likely to have success in quitting if you attend several counseling sessions. Even short sessions of 10 minutes can be effective.  Finding resources and support systems that can help you to quit smoking and remain smoke-free after you quit. These resources are most helpful when you use them often. They can include: ? Online chats with a counselor. ? Telephone quitlines. ? Printed self-help materials. ? Support groups or group counseling. ? Text messaging programs. ? Mobile phone applications.  Taking medicines to help you quit smoking. (If you are pregnant or breastfeeding, talk with your health care provider first.) Some medicines contain nicotine and some do not. Both types of medicines help with cravings, but the medicines that include nicotine help to relieve withdrawal symptoms. Your health care provider may recommend: ? Nicotine patches, gum, or lozenges. ? Nicotine inhalers or sprays. ? Non-nicotine medicine that is taken by mouth. Talk with your health care provider about combining strategies, such as taking medicines while you are also receiving in-person counseling. Using these two strategies together makes you   more likely to succeed in quitting than if you used either strategy on its own. If you are pregnant or breastfeeding, talk with your health care provider about finding counseling or other support strategies to quit smoking. Do not take medicine to help you quit smoking unless told to do so by your health care  provider. What things can I do to make it easier to quit? Quitting smoking might feel overwhelming at first, but there is a lot that you can do to make it easier. Take these important actions:  Reach out to your family and friends and ask that they support and encourage you during this time. Call telephone quitlines, reach out to support groups, or work with a counselor for support.  Ask people who smoke to avoid smoking around you.  Avoid places that trigger you to smoke, such as bars, parties, or smoke-break areas at work.  Spend time around people who do not smoke.  Lessen stress in your life, because stress can be a smoking trigger for some people. To lessen stress, try: ? Exercising regularly. ? Deep-breathing exercises. ? Yoga. ? Meditating. ? Performing a body scan. This involves closing your eyes, scanning your body from head to toe, and noticing which parts of your body are particularly tense. Purposefully relax the muscles in those areas.  Download or purchase mobile phone or tablet apps (applications) that can help you stick to your quit plan by providing reminders, tips, and encouragement. There are many free apps, such as QuitGuide from the CDC (Centers for Disease Control and Prevention). You can find other support for quitting smoking (smoking cessation) through smokefree.gov and other websites. How will I feel when I quit smoking? Within the first 24 hours of quitting smoking, you may start to feel some withdrawal symptoms. These symptoms are usually most noticeable 2-3 days after quitting, but they usually do not last beyond 2-3 weeks. Changes or symptoms that you might experience include:  Mood swings.  Restlessness, anxiety, or irritation.  Difficulty concentrating.  Dizziness.  Strong cravings for sugary foods in addition to nicotine.  Mild weight gain.  Constipation.  Nausea.  Coughing or a sore throat.  Changes in how your medicines work in your  body.  A depressed mood.  Difficulty sleeping (insomnia). After the first 2-3 weeks of quitting, you may start to notice more positive results, such as:  Improved sense of smell and taste.  Decreased coughing and sore throat.  Slower heart rate.  Lower blood pressure.  Clearer skin.  The ability to breathe more easily.  Fewer sick days. Quitting smoking is very challenging for most people. Do not get discouraged if you are not successful the first time. Some people need to make many attempts to quit before they achieve long-term success. Do your best to stick to your quit plan, and talk with your health care provider if you have any questions or concerns. This information is not intended to replace advice given to you by your health care provider. Make sure you discuss any questions you have with your health care provider. Document Released: 04/14/2001 Document Revised: 11/24/2016 Document Reviewed: 09/04/2014 Elsevier Interactive Patient Education  2019 Elsevier Inc.  

## 2018-10-26 DIAGNOSIS — M4802 Spinal stenosis, cervical region: Secondary | ICD-10-CM | POA: Diagnosis not present

## 2018-10-26 DIAGNOSIS — M62411 Contracture of muscle, right shoulder: Secondary | ICD-10-CM | POA: Diagnosis not present

## 2018-10-26 DIAGNOSIS — M50223 Other cervical disc displacement at C6-C7 level: Secondary | ICD-10-CM | POA: Diagnosis not present

## 2018-10-26 DIAGNOSIS — M62412 Contracture of muscle, left shoulder: Secondary | ICD-10-CM | POA: Diagnosis not present

## 2018-11-08 DIAGNOSIS — M25561 Pain in right knee: Secondary | ICD-10-CM | POA: Diagnosis not present

## 2018-11-08 DIAGNOSIS — I10 Essential (primary) hypertension: Secondary | ICD-10-CM | POA: Diagnosis not present

## 2018-11-23 ENCOUNTER — Ambulatory Visit: Payer: BC Managed Care – PPO | Admitting: Orthopedic Surgery

## 2018-11-29 DIAGNOSIS — E785 Hyperlipidemia, unspecified: Secondary | ICD-10-CM | POA: Diagnosis not present

## 2018-11-29 DIAGNOSIS — Z79899 Other long term (current) drug therapy: Secondary | ICD-10-CM | POA: Diagnosis not present

## 2018-11-29 DIAGNOSIS — I10 Essential (primary) hypertension: Secondary | ICD-10-CM | POA: Diagnosis not present

## 2018-12-06 ENCOUNTER — Other Ambulatory Visit: Payer: Self-pay

## 2018-12-06 DIAGNOSIS — I700 Atherosclerosis of aorta: Secondary | ICD-10-CM | POA: Diagnosis not present

## 2018-12-06 DIAGNOSIS — I10 Essential (primary) hypertension: Secondary | ICD-10-CM | POA: Diagnosis not present

## 2018-12-06 DIAGNOSIS — Z20822 Contact with and (suspected) exposure to covid-19: Secondary | ICD-10-CM

## 2018-12-06 DIAGNOSIS — E785 Hyperlipidemia, unspecified: Secondary | ICD-10-CM | POA: Diagnosis not present

## 2018-12-06 DIAGNOSIS — R05 Cough: Secondary | ICD-10-CM | POA: Diagnosis not present

## 2018-12-07 LAB — NOVEL CORONAVIRUS, NAA: SARS-CoV-2, NAA: NOT DETECTED

## 2018-12-15 DIAGNOSIS — M25561 Pain in right knee: Secondary | ICD-10-CM | POA: Diagnosis not present

## 2018-12-15 DIAGNOSIS — M542 Cervicalgia: Secondary | ICD-10-CM | POA: Diagnosis not present

## 2018-12-15 DIAGNOSIS — M792 Neuralgia and neuritis, unspecified: Secondary | ICD-10-CM | POA: Diagnosis not present

## 2018-12-15 DIAGNOSIS — M79661 Pain in right lower leg: Secondary | ICD-10-CM | POA: Diagnosis not present

## 2018-12-15 DIAGNOSIS — M25511 Pain in right shoulder: Secondary | ICD-10-CM | POA: Diagnosis not present

## 2018-12-15 DIAGNOSIS — M25571 Pain in right ankle and joints of right foot: Secondary | ICD-10-CM | POA: Diagnosis not present

## 2019-01-02 ENCOUNTER — Other Ambulatory Visit: Payer: Self-pay

## 2019-01-02 ENCOUNTER — Encounter: Payer: Self-pay | Admitting: Orthopedic Surgery

## 2019-01-02 ENCOUNTER — Ambulatory Visit: Payer: BC Managed Care – PPO | Admitting: Orthopedic Surgery

## 2019-01-02 VITALS — Ht 66.0 in | Wt 148.0 lb

## 2019-01-02 DIAGNOSIS — M76821 Posterior tibial tendinitis, right leg: Secondary | ICD-10-CM

## 2019-01-02 DIAGNOSIS — M2241 Chondromalacia patellae, right knee: Secondary | ICD-10-CM

## 2019-01-02 NOTE — Progress Notes (Signed)
Progress Note   Patient ID: Heather Davidson, female   DOB: 1960/09/22, 58 y.o.   MRN: [id number]   Chief Complaint  Patient presents with  . Knee Pain    PRP injections by Dr Ace Gins helped     Encounter Diagnoses  Name Primary?  . Posterior tibial tendon dysfunction (PTTD) of right lower extremity Yes  . Patellofemoral chondrosis of right knee     HPI 58 year old female seen and diagnosed patellofemoral arthritis and Pez planus was advised to get [employer] orthotics and home exercise program which she did but she also had several PRP injections and says her pain is 95% better.  She is taking 1 diclofenac 50 mg daily she is able to walk almost 2 miles  ROS    Ht 5\' 6"  (1.676 m)   Wt 148 lb (67.1 kg)   BMI 23.89 kg/m   Physical Exam Vitals signs and nursing note reviewed.  Constitutional:      Appearance: Normal appearance.  Musculoskeletal:     Comments: She has a mild pes planus with no tenderness over the posterior tibial tendon today ankle motion and strength are normal  Neurological:     Mental Status: She is alert and oriented to person, place, and time.  Psychiatric:        Mood and Affect: Mood normal.      Medical decisions:  (Established problem worse, x-ray ,physical therapy, over-the-counter medicines, read outside film or summarize x-ray)  Data   Encounter Diagnoses  Name Primary?  . Posterior tibial tendon dysfunction (PTTD) of right lower extremity Yes  . Patellofemoral chondrosis of right knee     PLAN:   Currently asymptomatic we do recommend that she go to the process of warming the orthotic on the left side and remolding it as she was having some difficulty with that 1 and follow-up with [place] on an as-needed basis    Arther Abbott, MD 01/02/2019 9:16 AM

## 2019-01-23 DIAGNOSIS — M60811 Other myositis, right shoulder: Secondary | ICD-10-CM | POA: Diagnosis not present

## 2019-01-23 DIAGNOSIS — M542 Cervicalgia: Secondary | ICD-10-CM | POA: Diagnosis not present

## 2019-01-23 DIAGNOSIS — M60812 Other myositis, left shoulder: Secondary | ICD-10-CM | POA: Diagnosis not present

## 2019-01-23 DIAGNOSIS — M25511 Pain in right shoulder: Secondary | ICD-10-CM | POA: Diagnosis not present

## 2019-03-01 DIAGNOSIS — E785 Hyperlipidemia, unspecified: Secondary | ICD-10-CM | POA: Diagnosis not present

## 2019-03-01 DIAGNOSIS — Z79899 Other long term (current) drug therapy: Secondary | ICD-10-CM | POA: Diagnosis not present

## 2019-03-08 DIAGNOSIS — I10 Essential (primary) hypertension: Secondary | ICD-10-CM | POA: Diagnosis not present

## 2019-03-08 DIAGNOSIS — R454 Irritability and anger: Secondary | ICD-10-CM | POA: Diagnosis not present

## 2019-03-08 DIAGNOSIS — R945 Abnormal results of liver function studies: Secondary | ICD-10-CM | POA: Diagnosis not present

## 2019-03-10 ENCOUNTER — Other Ambulatory Visit: Payer: Self-pay

## 2019-03-10 DIAGNOSIS — Z20822 Contact with and (suspected) exposure to covid-19: Secondary | ICD-10-CM

## 2019-03-11 LAB — NOVEL CORONAVIRUS, NAA: SARS-CoV-2, NAA: NOT DETECTED

## 2019-03-15 DIAGNOSIS — M545 Low back pain: Secondary | ICD-10-CM | POA: Diagnosis not present

## 2019-03-15 DIAGNOSIS — M60812 Other myositis, left shoulder: Secondary | ICD-10-CM | POA: Diagnosis not present

## 2019-03-15 DIAGNOSIS — M25561 Pain in right knee: Secondary | ICD-10-CM | POA: Diagnosis not present

## 2019-03-15 DIAGNOSIS — M792 Neuralgia and neuritis, unspecified: Secondary | ICD-10-CM | POA: Diagnosis not present

## 2019-03-15 DIAGNOSIS — G5611 Other lesions of median nerve, right upper limb: Secondary | ICD-10-CM | POA: Diagnosis not present

## 2019-03-15 DIAGNOSIS — M542 Cervicalgia: Secondary | ICD-10-CM | POA: Diagnosis not present

## 2019-03-15 DIAGNOSIS — M62412 Contracture of muscle, left shoulder: Secondary | ICD-10-CM | POA: Diagnosis not present

## 2019-03-15 DIAGNOSIS — M7552 Bursitis of left shoulder: Secondary | ICD-10-CM | POA: Diagnosis not present

## 2019-03-15 DIAGNOSIS — G5622 Lesion of ulnar nerve, left upper limb: Secondary | ICD-10-CM | POA: Diagnosis not present

## 2019-03-15 DIAGNOSIS — G5612 Other lesions of median nerve, left upper limb: Secondary | ICD-10-CM | POA: Diagnosis not present

## 2019-03-15 DIAGNOSIS — M47812 Spondylosis without myelopathy or radiculopathy, cervical region: Secondary | ICD-10-CM | POA: Diagnosis not present

## 2019-03-16 ENCOUNTER — Other Ambulatory Visit (HOSPITAL_COMMUNITY): Payer: Self-pay | Admitting: Internal Medicine

## 2019-03-16 ENCOUNTER — Other Ambulatory Visit: Payer: Self-pay | Admitting: Internal Medicine

## 2019-03-16 DIAGNOSIS — Z87891 Personal history of nicotine dependence: Secondary | ICD-10-CM

## 2019-03-23 DIAGNOSIS — G894 Chronic pain syndrome: Secondary | ICD-10-CM | POA: Diagnosis not present

## 2019-03-23 DIAGNOSIS — G5621 Lesion of ulnar nerve, right upper limb: Secondary | ICD-10-CM | POA: Diagnosis not present

## 2019-03-23 DIAGNOSIS — M5416 Radiculopathy, lumbar region: Secondary | ICD-10-CM | POA: Diagnosis not present

## 2019-03-23 DIAGNOSIS — G5612 Other lesions of median nerve, left upper limb: Secondary | ICD-10-CM | POA: Diagnosis not present

## 2019-03-24 ENCOUNTER — Other Ambulatory Visit: Payer: Self-pay

## 2019-03-24 DIAGNOSIS — Z20822 Contact with and (suspected) exposure to covid-19: Secondary | ICD-10-CM

## 2019-03-26 LAB — NOVEL CORONAVIRUS, NAA: SARS-CoV-2, NAA: NOT DETECTED

## 2019-03-28 ENCOUNTER — Other Ambulatory Visit: Payer: Self-pay | Admitting: *Deleted

## 2019-03-28 DIAGNOSIS — Z20822 Contact with and (suspected) exposure to covid-19: Secondary | ICD-10-CM

## 2019-03-29 LAB — NOVEL CORONAVIRUS, NAA: SARS-CoV-2, NAA: NOT DETECTED

## 2019-03-31 DIAGNOSIS — M5417 Radiculopathy, lumbosacral region: Secondary | ICD-10-CM | POA: Diagnosis not present

## 2019-03-31 DIAGNOSIS — M5127 Other intervertebral disc displacement, lumbosacral region: Secondary | ICD-10-CM | POA: Diagnosis not present

## 2019-03-31 DIAGNOSIS — M47816 Spondylosis without myelopathy or radiculopathy, lumbar region: Secondary | ICD-10-CM | POA: Diagnosis not present

## 2019-03-31 DIAGNOSIS — M5126 Other intervertebral disc displacement, lumbar region: Secondary | ICD-10-CM | POA: Diagnosis not present

## 2019-04-03 DIAGNOSIS — M5416 Radiculopathy, lumbar region: Secondary | ICD-10-CM | POA: Diagnosis not present

## 2019-04-03 DIAGNOSIS — M5127 Other intervertebral disc displacement, lumbosacral region: Secondary | ICD-10-CM | POA: Diagnosis not present

## 2019-04-03 DIAGNOSIS — M5137 Other intervertebral disc degeneration, lumbosacral region: Secondary | ICD-10-CM | POA: Diagnosis not present

## 2019-04-03 DIAGNOSIS — G894 Chronic pain syndrome: Secondary | ICD-10-CM | POA: Diagnosis not present

## 2019-04-04 ENCOUNTER — Other Ambulatory Visit: Payer: Self-pay

## 2019-04-04 ENCOUNTER — Ambulatory Visit (HOSPITAL_COMMUNITY)
Admission: RE | Admit: 2019-04-04 | Discharge: 2019-04-04 | Disposition: A | Discharge status: Home / Self Care | Payer: BC Managed Care – PPO | Source: Ambulatory Visit | Attending: Internal Medicine | Admitting: Internal Medicine

## 2019-04-04 DIAGNOSIS — F17210 Nicotine dependence, cigarettes, uncomplicated: Secondary | ICD-10-CM | POA: Diagnosis not present

## 2019-04-04 DIAGNOSIS — M5137 Other intervertebral disc degeneration, lumbosacral region: Secondary | ICD-10-CM | POA: Diagnosis not present

## 2019-04-04 DIAGNOSIS — M5127 Other intervertebral disc displacement, lumbosacral region: Secondary | ICD-10-CM | POA: Diagnosis not present

## 2019-04-04 DIAGNOSIS — Z87891 Personal history of nicotine dependence: Secondary | ICD-10-CM | POA: Diagnosis not present

## 2019-04-04 DIAGNOSIS — M545 Low back pain: Secondary | ICD-10-CM | POA: Diagnosis not present

## 2019-04-04 DIAGNOSIS — M5416 Radiculopathy, lumbar region: Secondary | ICD-10-CM | POA: Diagnosis not present

## 2019-04-04 DIAGNOSIS — Z122 Encounter for screening for malignant neoplasm of respiratory organs: Secondary | ICD-10-CM | POA: Insufficient documentation

## 2019-04-04 DIAGNOSIS — I700 Atherosclerosis of aorta: Secondary | ICD-10-CM | POA: Insufficient documentation

## 2019-04-04 DIAGNOSIS — I2510 Atherosclerotic heart disease of native coronary artery without angina pectoris: Secondary | ICD-10-CM | POA: Insufficient documentation

## 2019-04-20 DIAGNOSIS — M47817 Spondylosis without myelopathy or radiculopathy, lumbosacral region: Secondary | ICD-10-CM | POA: Diagnosis not present

## 2019-04-20 DIAGNOSIS — G894 Chronic pain syndrome: Secondary | ICD-10-CM | POA: Diagnosis not present

## 2019-04-20 DIAGNOSIS — M25571 Pain in right ankle and joints of right foot: Secondary | ICD-10-CM | POA: Diagnosis not present

## 2019-04-20 DIAGNOSIS — M25561 Pain in right knee: Secondary | ICD-10-CM | POA: Diagnosis not present

## 2019-06-30 ENCOUNTER — Other Ambulatory Visit: Payer: BC Managed Care – PPO

## 2019-06-30 ENCOUNTER — Other Ambulatory Visit: Payer: Self-pay

## 2019-06-30 ENCOUNTER — Other Ambulatory Visit (HOSPITAL_COMMUNITY): Payer: Self-pay | Admitting: Internal Medicine

## 2019-06-30 ENCOUNTER — Ambulatory Visit: Payer: 59 | Attending: Internal Medicine

## 2019-06-30 ENCOUNTER — Other Ambulatory Visit: Payer: Self-pay | Admitting: Internal Medicine

## 2019-06-30 DIAGNOSIS — Z8701 Personal history of pneumonia (recurrent): Secondary | ICD-10-CM

## 2019-06-30 DIAGNOSIS — Z20822 Contact with and (suspected) exposure to covid-19: Secondary | ICD-10-CM | POA: Insufficient documentation

## 2019-07-01 LAB — NOVEL CORONAVIRUS, NAA: SARS-CoV-2, NAA: NOT DETECTED

## 2019-07-14 ENCOUNTER — Ambulatory Visit (HOSPITAL_COMMUNITY): Payer: 59

## 2019-07-24 ENCOUNTER — Ambulatory Visit (HOSPITAL_COMMUNITY): Payer: 59

## 2019-07-24 ENCOUNTER — Encounter (HOSPITAL_COMMUNITY): Payer: Self-pay

## 2019-07-24 ENCOUNTER — Ambulatory Visit (HOSPITAL_COMMUNITY)
Admission: RE | Admit: 2019-07-24 | Discharge: 2019-07-24 | Disposition: A | Discharge status: Home / Self Care | Payer: 59 | Source: Ambulatory Visit | Attending: Internal Medicine | Admitting: Internal Medicine

## 2019-07-24 ENCOUNTER — Other Ambulatory Visit: Payer: Self-pay

## 2019-07-24 DIAGNOSIS — Z8701 Personal history of pneumonia (recurrent): Secondary | ICD-10-CM | POA: Insufficient documentation

## 2019-07-28 ENCOUNTER — Ambulatory Visit: Payer: 59 | Attending: Internal Medicine

## 2019-07-28 DIAGNOSIS — Z23 Encounter for immunization: Secondary | ICD-10-CM

## 2019-07-28 NOTE — Progress Notes (Signed)
   Covid-19 Vaccination Clinic  Name:  Heather Davidson    MRN: [id number] DOB: 20-Oct-1960  07/28/2019  [name] was observed post Covid-19 immunization for 15 minutes without incident. She was provided with Vaccine Information Sheet and instruction to access the V-Safe system.   [name] was instructed to call 911 with any severe reactions post vaccine: Marland Kitchen Difficulty breathing  . Swelling of face and throat  . A fast heartbeat  . A bad rash all over body  . Dizziness and weakness   Immunizations Administered    Name Date Dose VIS Date Route   Moderna COVID-19 Vaccine 07/28/2019  8:38 AM 0.5 mL 04/04/2019 Intramuscular   Manufacturer: Moderna   Lot: [id number]   NDC: 39672-897-91

## 2019-07-31 ENCOUNTER — Ambulatory Visit (INDEPENDENT_AMBULATORY_CARE_PROVIDER_SITE_OTHER): Payer: 59 | Admitting: Orthopedic Surgery

## 2019-07-31 ENCOUNTER — Ambulatory Visit: Payer: 59

## 2019-07-31 ENCOUNTER — Other Ambulatory Visit: Payer: Self-pay

## 2019-07-31 VITALS — BP 119/72 | HR 80 | Temp 97.2°F | Ht 66.0 in | Wt 148.0 lb

## 2019-07-31 DIAGNOSIS — F40240 Claustrophobia: Secondary | ICD-10-CM | POA: Diagnosis not present

## 2019-07-31 DIAGNOSIS — M5410 Radiculopathy, site unspecified: Secondary | ICD-10-CM | POA: Diagnosis not present

## 2019-07-31 DIAGNOSIS — M5136 Other intervertebral disc degeneration, lumbar region: Secondary | ICD-10-CM | POA: Diagnosis not present

## 2019-07-31 MED ORDER — ALPRAZOLAM 0.5 MG PO TABS: 0.5000 mg | ORAL_TABLET | Freq: Every evening | ORAL | 0 refills | Status: AC | PRN

## 2019-07-31 MED ORDER — GABAPENTIN 100 MG PO CAPS: 100.0000 mg | ORAL_CAPSULE | Freq: Three times a day (TID) | ORAL | 2 refills | Status: DC

## 2019-07-31 MED ORDER — DICLOFENAC SODIUM 50 MG PO TBEC: 50.0000 mg | DELAYED_RELEASE_TABLET | Freq: Two times a day (BID) | ORAL | 2 refills | Status: DC

## 2019-07-31 NOTE — Progress Notes (Signed)
Chief Complaint  Patient presents with  . Leg Pain    Recheck on right PTTD.     Encounter Diagnoses  Name Primary?  . Radicular pain of right lower extremity   . DDD (degenerative disc disease), lumbar Yes   59 year old female presents for reevaluation of her right foot but her pain is lateral but feels nerve the "" gets tingling to touch and the pain runs from her right hip down to her right foot  She had an MRI back in 2014 showed she has some disc problems back then she has been seen in [place] and had some plasma injections or something which are no longer helping  Past Medical History:  Diagnosis Date  . Anxiety   . Clostridium difficile colitis 12/2010  . Depression   . GERD (gastroesophageal reflux disease)   . HTN (hypertension)    physician stopped BP med  . Seasonal allergies     BP 119/72   Pulse 80   Temp (!) 97.2 F (36.2 C)   Ht 5\' 6"  (1.676 m)   Wt 148 lb (67.1 kg)   BMI 23.89 kg/m   Exam shows tenderness in her back right buttock lateral leg hypersensitivity in the lateral portion of the right foot  X-ray today  We see scoliosis which appears to be structural it is in the lumbar region we also see facet arthritis L3-4 L4-5 L5-S1 disc spaces are maintained no spondylolysis or listhesis  Recommend physical therapy  Recommend continue diclofenac start Neurontin  MRI be ordered if therapy does not work  Encounter Diagnoses  Name Primary?  . Radicular pain of right lower extremity   . DDD (degenerative disc disease), lumbar Yes    Meds ordered this encounter  Medications  . diclofenac (VOLTAREN) 50 MG EC tablet    Sig: Take 1 tablet (50 mg total) by mouth 2 (two) times daily.    Dispense:  60 tablet    Refill:  2  . gabapentin (NEURONTIN) 100 MG capsule    Sig: Take 1 capsule (100 mg total) by mouth 3 (three) times daily.    Dispense:  90 capsule    Refill:  2

## 2019-07-31 NOTE — Patient Instructions (Addendum)
We are scheduling for physical therapy  He will be started on gabapentin continue the diclofenac

## 2019-07-31 NOTE — Addendum Note (Signed)
Addended by: Fuller Canada E on: 07/31/2019 12:00 PM   Modules accepted: Orders

## 2019-08-09 ENCOUNTER — Other Ambulatory Visit: Payer: Self-pay

## 2019-08-09 ENCOUNTER — Encounter (HOSPITAL_COMMUNITY): Payer: Self-pay | Admitting: Physical Therapy

## 2019-08-09 ENCOUNTER — Ambulatory Visit (HOSPITAL_COMMUNITY): Payer: 59 | Attending: Orthopedic Surgery | Admitting: Physical Therapy

## 2019-08-09 DIAGNOSIS — M25561 Pain in right knee: Secondary | ICD-10-CM | POA: Diagnosis present

## 2019-08-09 DIAGNOSIS — M25551 Pain in right hip: Secondary | ICD-10-CM | POA: Diagnosis present

## 2019-08-09 DIAGNOSIS — M6281 Muscle weakness (generalized): Secondary | ICD-10-CM | POA: Insufficient documentation

## 2019-08-09 DIAGNOSIS — M25571 Pain in right ankle and joints of right foot: Secondary | ICD-10-CM | POA: Insufficient documentation

## 2019-08-09 DIAGNOSIS — M545 Low back pain: Secondary | ICD-10-CM | POA: Diagnosis present

## 2019-08-09 DIAGNOSIS — G8929 Other chronic pain: Secondary | ICD-10-CM | POA: Diagnosis present

## 2019-08-09 NOTE — Therapy (Signed)
[facility] [address] [place], [place], [address] Phone: [phone]   Fax:  [phone]  Physical Therapy Treatment  Patient Details  Name: Heather Davidson MRN: [id number] Date of Birth: 11/01/1960 Referring Provider (PT): Ty Hilts   Encounter Date: 08/09/2019  PT End of Session - 08/09/19 0916    Visit Number  1    Number of Visits  12    Date for PT Re-Evaluation  10/04/19    Authorization Type  [employer], prior auth req. VL 30, $100 copay    Authorization Time Period  08/09/19 to to 10/04/2019    Progress Note Due on Visit  10    PT Start Time  0915    PT Stop Time  0954    PT Time Calculation (min)  39 min    Activity Tolerance  Patient tolerated treatment well    Behavior During Therapy  [facility] for tasks assessed/performed       Past Medical History:  Diagnosis Date  . Anxiety   . Clostridium difficile colitis 12/2010  . Depression   . GERD (gastroesophageal reflux disease)   . HTN (hypertension)    physician stopped BP med  . Seasonal allergies     Past Surgical History:  Procedure Laterality Date  . ABDOMINAL HYSTERECTOMY    . CARPAL TUNNEL RELEASE Right 01/07/2018   Procedure: RIGHT CARPAL TUNNEL RELEASE;  Surgeon: Cindee Salt, MD;  Location: [facility];  Service: Orthopedics;  Laterality: Right;  FAB  . CERVICAL CONIZATION W/BX     for precervical cancer in remote past  . CESAREAN SECTION    . COLONOSCOPY  09/2007   anal papilla, normal colon and terminal ileum  . COLONOSCOPY WITH PROPOFOL N/A 02/10/2018   Procedure: COLONOSCOPY WITH PROPOFOL;  Surgeon: Corbin Ade, MD;  [facility];  Service: Endoscopy;  Laterality: N/A;  1:30pm  . complete hysterectomy    . ESOPHAGOGASTRODUODENOSCOPY  09/2007   solitary inlet patch (bx neg), antral erosions  . TUBAL LIGATION      There were no vitals filed for this visit.  Subjective Assessment - 08/09/19 1633    Subjective  Patient  complains of right hip, knee and ankle pain that started last year. States she has tried orthotics but her foot is so sensitive that she doesn't  like to wear tight shoes as it bothers her. States that her right knee will swell some time. She lives off of tiger balm and volteran gel as it helps with her pain. Reports prior to this she was walking 5 miles a day but now she can barely walk. states that she has had back pain forever and her last injection was over 6 years ago.    Currently in Pain?  Yes    Pain Score  8     Pain Location  Hip    Pain Orientation  Right    Pain Descriptors / Indicators  Aching;Tiring;Tingling    Pain Type  Chronic pain    Pain Radiating Towards  down lateral aspect of right leg to top of foot    Pain Onset  More than a month ago    Pain Frequency  Constant    Aggravating Factors   walking, standing, sitting         [facility] PT Assessment - 08/09/19 0001      Assessment   Medical Diagnosis  right hip/foot pain    Referring Provider (PT)  Ty Hilts    Prior Therapy  yes for back      Precautions   Precautions  None      Balance Screen   Has the patient fallen in the past 6 months  Yes    How many times?  6   leg gives out on right   Has the patient had a decrease in activity level because of a fear of falling?   Yes    Is the patient reluctant to leave their home because of a fear of falling?   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       Observation/Other Assessments   Observations  hypermobile body type    Focus on Therapeutic Outcomes  (FOTO)   68% limited      ROM / Strength   AROM / PROM / Strength  AROM;Strength      AROM   AROM Assessment Site  Lumbar;Hip;Knee;Ankle    Right/Left Hip  Right;Left    Right Hip Extension  --   WNL no change in symptoms   Right Hip Flexion  --   WNL - minor discomfort in right hip   Right Hip External Rotation   --   WNL   Right Hip Internal Rotation   --   WNL - minor discomfort in right hip   Left Hip Extension  --   WNL no change in symptoms   Left Hip Flexion  --   WNL   Left Hip External Rotation   --   WNL   Left Hip Internal Rotation   --   WNL   Lumbar Flexion  WNL   no change in symptoms   Lumbar Extension  WNL   no change in symptoms   Lumbar - Right Side Bend  WNL   no change in symptoms   Lumbar - Left Side Bend  WNL   no change in symptoms   Lumbar - Right Rotation  WNL   no change in symptoms   Lumbar - Left Rotation  WNL   no change in symptoms     Strength   Strength Assessment Site  Hip;Knee;Ankle    Right/Left Hip  Right;Left    Right Hip Extension  4-/5   muscle shaking noted in test position   Right Hip ABduction  4-/5    Left Hip Extension  4+/5    Left Hip ABduction  4+/5      Flexibility   Soft Tissue Assessment /Muscle Length  yes    Hamstrings  90/90 test WNL B    Quadriceps  - Ely's B, WNL       Palpation   Spinal mobility  muscle guarding noted in lumbar spine no significant pain noted just soreness    Palpation comment  no significant tenderness noted in hips and back and legs or either side; tenderness noted in lateral right quadriceps  [place] Adult PT Treatment/Exercise - 08/09/19 0001      Exercises   Exercises  Knee/Hip      Knee/Hip Exercises: Supine   Bridges  3 sets;10 reps   3" holds            PT Education - 08/09/19 1644    Education Details  on current condition, findings and HEP    Person(s) Educated  Patient    Methods  Explanation    Comprehension  Verbalized  understanding       PT Short Term Goals - 08/09/19 1309      PT SHORT TERM GOAL #1   Title  Patient will be independent in self management strategies to improve quality of life and functional outcomes.    Time  4    Period  Weeks    Status  New    Target Date  09/06/19      PT SHORT TERM GOAL #2   Title  Patient will report at least 25% improvement in overall symptoms and/or functional ability.    Time  4    Period  Weeks    Status  New    Target Date  09/06/19        PT Long Term Goals - 08/09/19 1309      PT LONG TERM GOAL #1   Title  Patient will report at least 50% improvement in overall symptoms and/or functional ability.    Time  8    Period  Weeks    Status  New    Target Date  10/04/19      PT LONG TERM GOAL #2   Title  Patient willreport no falls over a week's time to demonstrate improved balance    Time  8    Period  Weeks    Status  New    Target Date  10/04/19      PT LONG TERM GOAL #3   Title  Patient will score with < 55% limitation on FOTO to demonstrate improved functional mobility.    Time  8    Period  Weeks    Status  New    Target Date  10/04/19            Plan - 08/09/19 9528    Clinical Impression Statement  Patient presents with right leg pain that starts at the lateral aspect of her hip and goes down her leg to her foot.  could not reproduce leg symptoms with hip movements or lumbar movements on this date. Will continue to investigate lumbar spine in future sessions to completely rule it out but symptoms seem to be stemming from severe weakness in right lower extremity. After muscle testing and bridge exercise patient reported her hip did feel better. Will focus on strength training to improve functional mobility and assess hip and back as warranted in future sessions. Patient would continue to benefit from skilled physical therapy to improve functional mobility.    Personal Factors and Comorbidities  Comorbidity 1;Comorbidity  2;Comorbidity 3+    Comorbidities  hx of neck and back pain, lung disease, depression, HTN    Examination-Activity Limitations  Bathing;Bed Mobility;Stairs;Squat;Bend;Sit;Stand;Dressing;Lift;Locomotion Level;Transfers    Examination-Participation Restrictions  Yard Work;Shop;Meal Prep;Community Activity;Cleaning    Stability/Clinical Decision Making  Stable/Uncomplicated    Clinical Decision Making  Low    Rehab Potential  Good    PT Frequency  --   1-2x/ week for total of 12 visits over 8 week certification  PT Duration  8 weeks    PT Treatment/Interventions  ADLs/Self Care Home Management;Aquatic Therapy;Biofeedback;Cryotherapy;Electrical Stimulation;Iontophoresis 4mg /ml Dexamethasone;Moist Heat;Traction;Balance training;Therapeutic exercise;Therapeutic activities;Functional mobility training;Stair training;Gait training;Neuromuscular re-education;Patient/family education;Manual techniques;Dry needling;Passive range of motion    PT Next Visit Plan  assess balance, core and R LE strengthening, assess right/left psoas for possible nerve impingment    PT Home Exercise Plan  4/7 bridges    Consulted and Agree with Plan of Care  Patient       Patient will benefit from skilled therapeutic intervention in order to improve the following deficits and impairments:  Pain, Difficulty walking, Decreased balance, Decreased activity tolerance, Decreased endurance, Hypermobility, Decreased strength  Visit Diagnosis: Pain in right hip - Plan: PT plan of care cert/re-cert  Chronic pain of right knee - Plan: PT plan of care cert/re-cert  Pain in right ankle and joints of right foot - Plan: PT plan of care cert/re-cert     Problem List Patient Active Problem List   Diagnosis Date Noted  . Spondylosis without myelopathy or radiculopathy, cervical region 04/21/2018  . Neck pain 03/17/2018  . Chronic bilateral low back pain without sciatica 03/17/2018  . Encounter for screening colonoscopy 10/27/2017   . Failed conscious sedation during procedure 10/27/2017  . Chronic diarrhea 01/09/2015  . GERD (gastroesophageal reflux disease) 09/30/2010  . Constipation 09/30/2010    4:44 PM, 08/09/19 10/09/19, DPT Physical Therapy with [facility]  [phone] office  [facility] [address] [place], [place], [place] Phone: [phone]   Fax:  [phone]  Name: Jammy Stlouis MRN: Mayer Camel Date of Birth: 11-21-1960

## 2019-08-17 ENCOUNTER — Ambulatory Visit (HOSPITAL_COMMUNITY): Payer: 59 | Admitting: Physical Therapy

## 2019-08-17 ENCOUNTER — Encounter (HOSPITAL_COMMUNITY): Payer: Self-pay | Admitting: Physical Therapy

## 2019-08-17 ENCOUNTER — Other Ambulatory Visit: Payer: Self-pay

## 2019-08-17 DIAGNOSIS — M25551 Pain in right hip: Secondary | ICD-10-CM | POA: Diagnosis not present

## 2019-08-17 DIAGNOSIS — M25571 Pain in right ankle and joints of right foot: Secondary | ICD-10-CM

## 2019-08-17 DIAGNOSIS — M25561 Pain in right knee: Secondary | ICD-10-CM

## 2019-08-17 DIAGNOSIS — G8929 Other chronic pain: Secondary | ICD-10-CM

## 2019-08-17 DIAGNOSIS — M545 Low back pain, unspecified: Secondary | ICD-10-CM

## 2019-08-17 DIAGNOSIS — M6281 Muscle weakness (generalized): Secondary | ICD-10-CM

## 2019-08-17 NOTE — Therapy (Signed)
[place], [place], [address] Phone: [phone]   Fax:  [phone]  Physical Therapy Treatment  Patient Details  Name: Heather Davidson MRN: [id number] Date of Birth: August 10, 1960 Referring Provider (PT): Adonis Huguenin   Encounter Date: 08/17/2019  PT End of Session - 08/17/19 0830    Visit Number  2    Number of Visits  12    Date for PT Re-Evaluation  10/04/19   POC dates 08/09/19 to 10/04/19   Authorization Type  [employer], prior auth req. VL 30, $100 copay    Authorization Time Period  --    Authorization - Visit Number  2    Authorization - Number of Visits  30    Progress Note Due on Visit  10    PT Start Time  0830    PT Stop Time  0910    PT Time Calculation (min)  40 min    Activity Tolerance  Patient tolerated treatment well    Behavior During Therapy  WFL for tasks assessed/performed       Past Medical History:  Diagnosis Date  . Anxiety   . Clostridium difficile colitis 12/2010  . Depression   . GERD (gastroesophageal reflux disease)   . HTN (hypertension)    physician stopped BP med  . Seasonal allergies     Past Surgical History:  Procedure Laterality Date  . ABDOMINAL HYSTERECTOMY    . CARPAL TUNNEL RELEASE Right 01/07/2018   Procedure: RIGHT CARPAL TUNNEL RELEASE;  Surgeon: Daryll Brod, MD;  Location: [facility];  Service: Orthopedics;  Laterality: Right;  FAB  . CERVICAL CONIZATION W/BX     for precervical cancer in remote past  . CESAREAN SECTION    . COLONOSCOPY  09/2007   anal papilla, normal colon and terminal ileum  . COLONOSCOPY WITH PROPOFOL N/A 02/10/2018   Procedure: COLONOSCOPY WITH PROPOFOL;  Surgeon: Daneil Dolin, MD;  [facility];  Service: Endoscopy;  Laterality: N/A;  1:30pm  . complete hysterectomy    . ESOPHAGOGASTRODUODENOSCOPY  09/2007   solitary inlet patch (bx neg), antral erosions  . TUBAL LIGATION      There were no vitals  filed for this visit.  Subjective Assessment - 08/17/19 0839    Subjective  States she feels the same. States she did feel good after she does it but she has been very painful and has been doing the exercise about once every other day. States she has spent too much time sitting down and leaning forward. States she is currently about 8/10 pain and she feels like she needs a massage.    Currently in Pain?  Yes    Pain Score  8     Pain Location  Hip    Pain Orientation  Right    Pain Descriptors / Indicators  Aching;Tingling    Pain Type  Chronic pain    Pain Radiating Towards  radiating down right leg into knee and foot    Pain Onset  More than a month ago         [facility] PT Assessment - 08/17/19 0001      Assessment   Medical Diagnosis  right hip/foot pain    Referring Provider (PT)  Adonis Huguenin    Prior Therapy  yes for back                   [facility] Adult PT Treatment/Exercise -  08/17/19 0001      Knee/Hip Exercises: Standing   Other Standing Knee Exercises  self mobilization to posterior chain muscles with lacrosse ball at wall.       Knee/Hip Exercises: Seated   Other Seated Knee/Hip Exercises  self massage with mobilization stick - to right leg      Knee/Hip Exercises: Prone   Hamstring Curl  15 reps;2 seconds   2 sets R      Manual Therapy   Manual Therapy  Soft tissue mobilization    Manual therapy comments  all manual interventions performed independently of other interventions    Soft tissue mobilization  IASTM to right LE and lumbar spine in prone with percussion gun to educate patient in use of possible benefits of percussion gun.              PT Education - 08/17/19 0904    Education Details  on ways to perform self massage at home. educated patient on importance of HEP compliance and benefits of muscle activation.    Person(s) Educated  Patient    Methods  Explanation    Comprehension  Verbalized understanding       PT Short Term  Goals - 08/09/19 1309      PT SHORT TERM GOAL #1   Title  Patient will be independent in self management strategies to improve quality of life and functional outcomes.    Time  4    Period  Weeks    Status  New    Target Date  09/06/19      PT SHORT TERM GOAL #2   Title  Patient will report at least 25% improvement in overall symptoms and/or functional ability.    Time  4    Period  Weeks    Status  New    Target Date  09/06/19        PT Long Term Goals - 08/09/19 1309      PT LONG TERM GOAL #1   Title  Patient will report at least 50% improvement in overall symptoms and/or functional ability.    Time  8    Period  Weeks    Status  New    Target Date  10/04/19      PT LONG TERM GOAL #2   Title  Patient willreport no falls over a week's time to demonstrate improved balance    Time  8    Period  Weeks    Status  New    Target Date  10/04/19      PT LONG TERM GOAL #3   Title  Patient will score with < 55% limitation on FOTO to demonstrate improved functional mobility.    Time  8    Period  Weeks    Status  New    Target Date  10/04/19            Plan - 08/17/19 0914    Clinical Impression Statement  Focus today was on demonstrating different ways for patient to achieve self massage at home as she feels that is most beneficial for her. Educated her also on importance of muscle activation/strengthening in regards to decreasing pain and improving cause of symptoms. Patient verbalized understanding. Reported decreased pain end of session.    Personal Factors and Comorbidities  Comorbidity 1;Comorbidity 2;Comorbidity 3+    Comorbidities  hx of neck and back pain, lung disease, depression, HTN    Examination-Activity Limitations  Bathing;Bed Mobility;Stairs;Squat;Bend;Sit;Stand;Dressing;Lift;Locomotion Level;Transfers  Examination-Participation Restrictions  Yard Work;Shop;Meal Prep;Community Activity;Cleaning    Stability/Clinical Decision Making  Stable/Uncomplicated     Rehab Potential  Good    PT Frequency  --   1-2x/ week for total of 12 visits over 8 week certification   PT Duration  8 weeks    PT Treatment/Interventions  ADLs/Self Care Home Management;Aquatic Therapy;Biofeedback;Cryotherapy;Electrical Stimulation;Iontophoresis 4mg /ml Dexamethasone;Moist Heat;Traction;Balance training;Therapeutic exercise;Therapeutic activities;Functional mobility training;Stair training;Gait training;Neuromuscular re-education;Patient/family education;Manual techniques;Dry needling;Passive range of motion    PT Next Visit Plan  assess balance, core and R LE strengthening, assess right/left psoas for possible nerve impingment    PT Home Exercise Plan  4/7 bridges; 4/15 self massage with ball/stick/masssager    Consulted and Agree with Plan of Care  Patient       Patient will benefit from skilled therapeutic intervention in order to improve the following deficits and impairments:  Pain, Difficulty walking, Decreased balance, Decreased activity tolerance, Decreased endurance, Hypermobility, Decreased strength  Visit Diagnosis: Pain in right hip  Chronic pain of right knee  Pain in right ankle and joints of right foot  Chronic bilateral low back pain without sciatica  Muscle weakness (generalized)     Problem List Patient Active Problem List   Diagnosis Date Noted  . Spondylosis without myelopathy or radiculopathy, cervical region 04/21/2018  . Neck pain 03/17/2018  . Chronic bilateral low back pain without sciatica 03/17/2018  . Encounter for screening colonoscopy 10/27/2017  . Failed conscious sedation during procedure 10/27/2017  . Chronic diarrhea 01/09/2015  . GERD (gastroesophageal reflux disease) 09/30/2010  . Constipation 09/30/2010    9:15 AM, 08/17/19 08/19/19, DPT Physical Therapy with [facility]  [phone] office  [facility] [address] [place], [place],  [place] Phone: [phone]   Fax:  [phone]  Name: Heather Davidson MRN: Mayer Camel Date of Birth: 1961-03-11

## 2019-08-22 ENCOUNTER — Other Ambulatory Visit (HOSPITAL_COMMUNITY): Payer: Self-pay | Admitting: Internal Medicine

## 2019-08-22 DIAGNOSIS — R519 Headache, unspecified: Secondary | ICD-10-CM

## 2019-08-23 ENCOUNTER — Ambulatory Visit (HOSPITAL_COMMUNITY): Payer: 59 | Admitting: Physical Therapy

## 2019-08-23 ENCOUNTER — Other Ambulatory Visit: Payer: Self-pay

## 2019-08-23 ENCOUNTER — Encounter (HOSPITAL_COMMUNITY): Payer: Self-pay | Admitting: Physical Therapy

## 2019-08-23 DIAGNOSIS — M545 Low back pain, unspecified: Secondary | ICD-10-CM

## 2019-08-23 DIAGNOSIS — M25571 Pain in right ankle and joints of right foot: Secondary | ICD-10-CM

## 2019-08-23 DIAGNOSIS — M6281 Muscle weakness (generalized): Secondary | ICD-10-CM

## 2019-08-23 DIAGNOSIS — G8929 Other chronic pain: Secondary | ICD-10-CM

## 2019-08-23 DIAGNOSIS — M25551 Pain in right hip: Secondary | ICD-10-CM | POA: Diagnosis not present

## 2019-08-23 NOTE — Patient Instructions (Signed)
Access Code: [email or web address] URL: [email or web address] Date: 08/23/2019 Prepared by: Revonda Humphrey  Exercises Supine Diaphragmatic Breathing - 2 sets - 15 reps Supine Transversus Abdominis Bracing - Hands on Stomach - 2 sets - 15 reps - 5 hold Supine Transversus Abdominis Bracing with Double Leg Fallout - 2 sets - 15 reps Hooklying Single Knee to Chest Stretch - 1 sets - 5 reps - 10 hold

## 2019-08-23 NOTE — Therapy (Addendum)
[place], [place], [address] Phone: [phone]   Fax:  [phone]  Physical Therapy Treatment and Discharge  Patient Details  Name: Heather Davidson MRN: [id number] Date of Birth: 03-22-1961 Referring Provider (PT): [facility] THERAPY DISCHARGE SUMMARY  Visits from Start of Care: 3  Current functional level related to goals / functional outcomes: Unable to assess secondary to unplanned discharge. Patient chooses to discharge secondary to wanting to wait for MRI results.    Remaining deficits: Unable to assess secondary to unplanned discharge     Education / Equipment: See below Plan: Patient agrees to discharge.  Patient goals were not met. Patient is being discharged due to the patient's request.  ?????        Encounter Date: 08/23/2019  PT End of Session - 08/23/19 1000    Visit Number  3    Number of Visits  12    Date for PT Re-Evaluation  10/04/19   POC dates 08/09/19 to 10/04/19   Authorization Type  [employer], prior auth req. VL 30, $100 copay    Authorization - Visit Number  3    Authorization - Number of Visits  30    Progress Note Due on Visit  10    PT Start Time  1001    PT Stop Time  1041    PT Time Calculation (min)  40 min    Activity Tolerance  Patient tolerated treatment well    Behavior During Therapy  WFL for tasks assessed/performed       Past Medical History:  Diagnosis Date  . Anxiety   . Clostridium difficile colitis 12/2010  . Depression   . GERD (gastroesophageal reflux disease)   . HTN (hypertension)    physician stopped BP med  . Seasonal allergies     Past Surgical History:  Procedure Laterality Date  . ABDOMINAL HYSTERECTOMY    . CARPAL TUNNEL RELEASE Right 01/07/2018   Procedure: RIGHT CARPAL TUNNEL RELEASE;  Surgeon: Daryll Brod, MD;  Location: [facility];  Service: Orthopedics;  Laterality: Right;  FAB  . CERVICAL  CONIZATION W/BX     for precervical cancer in remote past  . CESAREAN SECTION    . COLONOSCOPY  09/2007   anal papilla, normal colon and terminal ileum  . COLONOSCOPY WITH PROPOFOL N/A 02/10/2018   Procedure: COLONOSCOPY WITH PROPOFOL;  Surgeon: Daneil Dolin, MD;  [facility];  Service: Endoscopy;  Laterality: N/A;  1:30pm  . complete hysterectomy    . ESOPHAGOGASTRODUODENOSCOPY  09/2007   solitary inlet patch (bx neg), antral erosions  . TUBAL LIGATION      There were no vitals filed for this visit.  Subjective Assessment - 08/23/19 1001    Subjective  States after last session her right hip and leg and lower back was very sore and so she rested and used heated and then felt better the next day. States she did a little yard work and had to take it easier. States she hasn't done it since last session.  States currently she is just having low back pain 4/10 and reports it as dull. Mild symptoms in right hip.    Currently in Pain?  Yes    Pain Score  4     Pain Location  Back    Pain Orientation  Mid;Left;Right    Pain Descriptors / Indicators  Aching    Pain Type  Chronic  pain    Pain Onset  More than a month ago         [facility] PT Assessment - 08/23/19 0001      Assessment   Medical Diagnosis  right hip/foot pain    Referring Provider (PT)  Adonis Huguenin                   [facility] Adult PT Treatment/Exercise - 08/23/19 0001      Knee/Hip Exercises: Stretches   Other Knee/Hip Stretches  SKC - 10" holds x15 B      Knee/Hip Exercises: Supine   Other Supine Knee/Hip Exercises  diaphragmatic breathing - 5 minutes  - prior demo and verbal/tactile cues - supine     Other Supine Knee/Hip Exercises  TRA contraction - prior demo - many cues to activate muscle - x15 5" holds --> wihtbent knee fall outs and PT tactile cues - pt countingoutload to assist with breathing - 10 minutes practice      Manual Therapy   Manual Therapy  Soft tissue mobilization     Manual therapy comments  all manual interventions performed independently of other interventions    Soft tissue mobilization  TPR and STM to R Psoas and iliacus- increased tingling in right leg noted.              PT Education - 08/23/19 1024    Education Details  Educated patient about psoas muscle, nerves traveling through muscle and how nerve Impingement may happen- used pictures for anatomy during explanation. Educated patient in diaphragmatic breathing and how it can change intraabdominal pressure.    Person(s) Educated  Patient    Methods  Explanation    Comprehension  Verbalized understanding       PT Short Term Goals - 08/09/19 1309      PT SHORT TERM GOAL #1   Title  Patient will be independent in self management strategies to improve quality of life and functional outcomes.    Time  4    Period  Weeks    Status  New    Target Date  09/06/19      PT SHORT TERM GOAL #2   Title  Patient will report at least 25% improvement in overall symptoms and/or functional ability.    Time  4    Period  Weeks    Status  New    Target Date  09/06/19        PT Long Term Goals - 08/09/19 1309      PT LONG TERM GOAL #1   Title  Patient will report at least 50% improvement in overall symptoms and/or functional ability.    Time  8    Period  Weeks    Status  New    Target Date  10/04/19      PT LONG TERM GOAL #2   Title  Patient willreport no falls over a week's time to demonstrate improved balance    Time  8    Period  Weeks    Status  New    Target Date  10/04/19      PT LONG TERM GOAL #3   Title  Patient will score with < 55% limitation on FOTO to demonstrate improved functional mobility.    Time  8    Period  Weeks    Status  New    Target Date  10/04/19            Plan - 08/23/19  1038    Clinical Impression Statement  Focused on educating patient on intra-abdominal pressure. Added diaphragmatic breathing and deep TRA activation. Difficulties with TRA  activation secondary to patient wanting to hold breath. Required prior demonstrate, verbal and tactile cues as well as practice in clinic. Educated patient on importance of compliance with HEP to have best outcomes. Patient verbalized understanding. Will continue to work on breathing, deep core activation and reassess Psoas/iliacus muscles in future sessions.    Personal Factors and Comorbidities  Comorbidity 1;Comorbidity 2;Comorbidity 3+    Comorbidities  hx of neck and back pain, lung disease, depression, HTN    Examination-Activity Limitations  Bathing;Bed Mobility;Stairs;Squat;Bend;Sit;Stand;Dressing;Lift;Locomotion Level;Transfers    Examination-Participation Restrictions  Yard Work;Shop;Meal Prep;Community Activity;Cleaning    Stability/Clinical Decision Making  Stable/Uncomplicated    Rehab Potential  Good    PT Frequency  --   1-2x/ week for total of 12 visits over 8 week certification   PT Duration  8 weeks    PT Treatment/Interventions  ADLs/Self Care Home Management;Aquatic Therapy;Biofeedback;Cryotherapy;Electrical Stimulation;Iontophoresis 12m/ml Dexamethasone;Moist Heat;Traction;Balance training;Therapeutic exercise;Therapeutic activities;Functional mobility training;Stair training;Gait training;Neuromuscular re-education;Patient/family education;Manual techniques;Dry needling;Passive range of motion    PT Next Visit Plan  assess balance, core and R LE strengthening, assess right/left psoas for possible nerve impingment    PT Home Exercise Plan  4/7 bridges; 4/15 self massage with ball/stick/masssager; 4/21 diaphragmatic breathing, TRA contraction    Consulted and Agree with Plan of Care  Patient       Patient will benefit from skilled therapeutic intervention in order to improve the following deficits and impairments:  Pain, Difficulty walking, Decreased balance, Decreased activity tolerance, Decreased endurance, Hypermobility, Decreased strength  Visit Diagnosis: Pain in right  hip  Chronic pain of right knee  Pain in right ankle and joints of right foot  Chronic bilateral low back pain without sciatica  Muscle weakness (generalized)     Problem List Patient Active Problem List   Diagnosis Date Noted  . Spondylosis without myelopathy or radiculopathy, cervical region 04/21/2018  . Neck pain 03/17/2018  . Chronic bilateral low back pain without sciatica 03/17/2018  . Encounter for screening colonoscopy 10/27/2017  . Failed conscious sedation during procedure 10/27/2017  . Chronic diarrhea 01/09/2015  . GERD (gastroesophageal reflux disease) 09/30/2010  . Constipation 09/30/2010   10:49 AM, 08/23/19 MJerene Pitch DPT [facility] [phone]  [place] [address]Phone: [phone]  Fax:  [phone] Name: JOneisha AmmonsMRN: [id number] of Birth: [birth year]

## 2019-08-26 ENCOUNTER — Encounter (HOSPITAL_COMMUNITY): Payer: Self-pay | Admitting: *Deleted

## 2019-08-26 ENCOUNTER — Other Ambulatory Visit: Payer: Self-pay

## 2019-08-26 ENCOUNTER — Emergency Department (HOSPITAL_COMMUNITY)
Admission: EM | Admit: 2019-08-26 | Discharge: 2019-08-26 | Disposition: A | Discharge status: Home / Self Care | Payer: 59 | Attending: Emergency Medicine | Admitting: Emergency Medicine

## 2019-08-26 DIAGNOSIS — Z7982 Long term (current) use of aspirin: Secondary | ICD-10-CM | POA: Diagnosis not present

## 2019-08-26 DIAGNOSIS — I10 Essential (primary) hypertension: Secondary | ICD-10-CM | POA: Insufficient documentation

## 2019-08-26 DIAGNOSIS — R55 Syncope and collapse: Secondary | ICD-10-CM

## 2019-08-26 DIAGNOSIS — E871 Hypo-osmolality and hyponatremia: Secondary | ICD-10-CM | POA: Insufficient documentation

## 2019-08-26 DIAGNOSIS — F17210 Nicotine dependence, cigarettes, uncomplicated: Secondary | ICD-10-CM | POA: Insufficient documentation

## 2019-08-26 DIAGNOSIS — Z79899 Other long term (current) drug therapy: Secondary | ICD-10-CM | POA: Diagnosis not present

## 2019-08-26 DIAGNOSIS — R42 Dizziness and giddiness: Secondary | ICD-10-CM | POA: Diagnosis present

## 2019-08-26 LAB — BASIC METABOLIC PANEL
Anion gap: 12 (ref 5–15)
BUN: 15 mg/dL (ref 6–20)
CO2: 26 mmol/L (ref 22–32)
Calcium: 8.8 mg/dL — ABNORMAL LOW (ref 8.9–10.3)
Chloride: 85 mmol/L — ABNORMAL LOW (ref 98–111)
Creatinine, Ser: 0.73 mg/dL (ref 0.44–1.00)
GFR calc Af Amer: >60 mL/min (ref >60)
GFR calc non Af Amer: >60 mL/min (ref >60)
Glucose, Bld: 102 mg/dL — ABNORMAL HIGH (ref 70–99)
Potassium: 3.3 mmol/L — ABNORMAL LOW (ref 3.5–5.1)
Sodium: 123 mmol/L — ABNORMAL LOW (ref 135–145)

## 2019-08-26 LAB — CBC WITH DIFFERENTIAL/PLATELET
Abs Immature Granulocytes: 0.07 10*3/uL (ref 0.00–0.07)
Basophils Absolute: 0 10*3/uL (ref 0.0–0.1)
Basophils Relative: 0 %
Eosinophils Absolute: 0.1 10*3/uL (ref 0.0–0.5)
Eosinophils Relative: 1 %
HCT: 36.4 % (ref 36.0–46.0)
Hemoglobin: 13 g/dL (ref 12.0–15.0)
Immature Granulocytes: 1 %
Lymphocytes Relative: 13 %
Lymphs Abs: 1.7 10*3/uL (ref 0.7–4.0)
MCH: 34 pg (ref 26.0–34.0)
MCHC: 35.7 g/dL (ref 30.0–36.0)
MCV: 95.3 fL (ref 80.0–100.0)
Monocytes Absolute: 1.2 10*3/uL — ABNORMAL HIGH (ref 0.1–1.0)
Monocytes Relative: 9 %
Neutro Abs: 9.4 10*3/uL — ABNORMAL HIGH (ref 1.7–7.7)
Neutrophils Relative %: 76 %
Platelets: 306 10*3/uL (ref 150–400)
RBC: 3.82 MIL/uL — ABNORMAL LOW (ref 3.87–5.11)
RDW: 13.5 % (ref 11.5–15.5)
WBC: 12.4 10*3/uL — ABNORMAL HIGH (ref 4.0–10.5)
nRBC: 0 % (ref 0.0–0.2)

## 2019-08-26 LAB — CBG MONITORING, ED: Glucose-Capillary: 138 mg/dL — ABNORMAL HIGH (ref 70–99)

## 2019-08-26 MED ORDER — SODIUM CHLORIDE 0.9 % IV BOLUS
1000.0000 mL | Freq: Once | INTRAVENOUS | Status: AC
  Administered 2019-08-26: 18:00:00 1000 mL via INTRAVENOUS

## 2019-08-26 MED ORDER — SODIUM CHLORIDE 0.9 % IV SOLN: INTRAVENOUS | Status: DC

## 2019-08-26 NOTE — Discharge Instructions (Signed)
Stop taking your diuretic medication.  Continue your other blood pressure medications.  Follow-up with your doctor to have your sodium level rechecked.  Return to the ED for confusion, weakness or other concerning symptoms

## 2019-08-26 NOTE — ED Triage Notes (Signed)
Patient at the [facility] and had been eating while she became dizzy after going to the toilet.  EMS reports 79/56 for initial BP, CBG 98.  Patient receive 500 NS bolus, BP improved.  Patient reports taking a neighbors Hydrocodone for her chronic back pain while at the [facility] today just before the near syncopal episode.

## 2019-08-26 NOTE — ED Provider Notes (Signed)
[facility] Provider Note   CSN: [id number] Arrival date & time: 08/26/19  3818     History Chief Complaint  Patient presents with  . Near Syncope    Heather Davidson is a 59 y.o. female.  HPI   Patient presents to the ED for evaluation after a syncopal episode.  Patient states she was eating at a restaurant today.  She started to feel lightheaded and decided to go to the bathroom.  Patient states she continued to feel dizzy and then fell to the ground.  Patient did have a few drinks of alcohol today.  She also took a hydrocodone from a friend.  She has taken hydrocodone in the past so she does not think that is related.  She denies any trouble with chest pain or shortness of breath.  No fevers or chills.  Right now she feels fine.  When EMS arrived her blood pressure was low at 79/56.  Blood sugar was normal.  Past Medical History:  Diagnosis Date  . Anxiety   . Clostridium difficile colitis 12/2010  . Depression   . GERD (gastroesophageal reflux disease)   . HTN (hypertension)    physician stopped BP med  . Seasonal allergies     Patient Active Problem List   Diagnosis Date Noted  . Spondylosis without myelopathy or radiculopathy, cervical region 04/21/2018  . Neck pain 03/17/2018  . Chronic bilateral low back pain without sciatica 03/17/2018  . Encounter for screening colonoscopy 10/27/2017  . Failed conscious sedation during procedure 10/27/2017  . Chronic diarrhea 01/09/2015  . GERD (gastroesophageal reflux disease) 09/30/2010  . Constipation 09/30/2010    Past Surgical History:  Procedure Laterality Date  . ABDOMINAL HYSTERECTOMY    . CARPAL TUNNEL RELEASE Right 01/07/2018   Procedure: RIGHT CARPAL TUNNEL RELEASE;  Surgeon: Daryll Brod, MD;  Location: [facility];  Service: Orthopedics;  Laterality: Right;  FAB  . CERVICAL CONIZATION W/BX     for precervical cancer in remote past  . CESAREAN SECTION    . COLONOSCOPY  09/2007   anal papilla, normal colon and terminal ileum  . COLONOSCOPY WITH PROPOFOL N/A 02/10/2018   Procedure: COLONOSCOPY WITH PROPOFOL;  Surgeon: Daneil Dolin, MD;  [facility];  Service: Endoscopy;  Laterality: N/A;  1:30pm  . complete hysterectomy    . ESOPHAGOGASTRODUODENOSCOPY  09/2007   solitary inlet patch (bx neg), antral erosions  . TUBAL LIGATION       OB History   No obstetric history on file.     Family History  Problem Relation Age of Onset  . Lung cancer Father        asbestosis  . COPD Mother        Died 95  . Colon cancer Neg Hx     Social History   Tobacco Use  . Smoking status: Current Every Day Smoker    Packs/day: 1.00    Years: 25.00    Pack years: 25.00    Types: Cigarettes  . Smokeless tobacco: Never Used  Substance Use Topics  . Alcohol use: Yes    Alcohol/week: 0.0 standard drinks    Comment: 4 glass of red wine a night   . Drug use: No    Home Medications Prior to Admission medications   Medication Sig Start Date End Date Taking? Authorizing Provider  amLODipine (NORVASC) 5 MG tablet Take 5 mg by mouth at bedtime. 08/21/19  Yes [provider]  aspirin 81  MG tablet Take 81 mg by mouth daily.    Yes [provider]  atorvastatin (LIPITOR) 80 MG tablet Take 80 mg by mouth in the morning.  08/21/19  Yes [provider]  cetirizine (ZYRTEC) 10 MG tablet Take 10 mg by mouth daily.   Yes [provider]  citalopram (CELEXA) 20 MG tablet Take 20 mg by mouth daily. 07/31/19  Yes [provider]  diclofenac (VOLTAREN) 50 MG EC tablet Take 1 tablet (50 mg total) by mouth 2 (two) times daily. 07/31/19  Yes Vickki Hearing, MD  eszopiclone (LUNESTA) 2 MG TABS tablet Take 2 mg by mouth at bedtime. Take immediately before bedtime   Yes [provider]  gabapentin (NEURONTIN) 100 MG capsule Take 1 capsule (100 mg total) by mouth 3 (three) times daily. 07/31/19  Yes Vickki Hearing, MD   ketoconazole (NIZORAL) 2 % cream Apply 1 application topically in the morning and at bedtime. 14d course starting on 08/21/2019 to apply to corners of mouth 08/21/19  Yes [provider]  losartan (COZAAR) 100 MG tablet Take 100 mg by mouth daily.     Yes [provider]  methocarbamol (ROBAXIN) 500 MG tablet Take 500 mg by mouth 3 (three) times daily.    Yes [provider]  montelukast (SINGULAIR) 10 MG tablet Take 10 mg by mouth in the morning.  02/24/12  Yes [provider]  Multiple Vitamin (MULTIVITAMIN) capsule Take 1 capsule by mouth daily.   Yes [provider]  omeprazole (PRILOSEC OTC) 20 MG tablet Take 20 mg by mouth in the morning.    Yes [provider]  pilocarpine (SALAGEN) 5 MG tablet Take 5 mg by mouth in the morning and at bedtime. 08/11/19  Yes [provider]  polyethylene glycol powder (GLYCOLAX/MIRALAX) 17 GM/SCOOP powder Take 17 g by mouth daily as needed for mild constipation or moderate constipation.   Yes [provider]  RESTASIS 0.05 % ophthalmic emulsion Place 1 drop into both eyes 2 (two) times daily. 03/05/12  Yes [provider]  topiramate (TOPAMAX) 25 MG capsule Take 25 mg by mouth in the morning.    Yes [provider]  vitamin B-12 (CYANOCOBALAMIN) 1000 MCG tablet Take 1,000 mcg by mouth daily.   Yes [provider]  ALPRAZolam Prudy Feeler) 0.5 MG tablet Take 1 tablet (0.5 mg total) by mouth at bedtime as needed for anxiety. 07/31/19   Vickki Hearing, MD    Allergies    Ciprofloxacin and Zithromax [azithromycin]  Review of Systems   Review of Systems  Constitutional: Negative for fever.  Gastrointestinal: Positive for constipation. Negative for blood in stool.  Neurological: Negative for speech difficulty, numbness and headaches.  All other systems reviewed and are negative.   Physical Exam Updated Vital Signs BP (!) 141/82 (BP Location: Right Arm)   Pulse 78    Temp 98.2 F (36.8 C) (Oral)   Resp 14   Ht 1.676 m (5\' 6" )   Wt 68 kg   SpO2 100%   BMI 24.21 kg/m   Physical Exam Vitals and nursing note reviewed.  Constitutional:      General: She is not in acute distress.    Appearance: She is well-developed.  HENT:     Head: Normocephalic and atraumatic.     Right Ear: External ear normal.     Left Ear: External ear normal.  Eyes:     General: No scleral icterus.  Right eye: No discharge.        Left eye: No discharge.     Conjunctiva/sclera: Conjunctivae normal.  Neck:     Trachea: No tracheal deviation.  Cardiovascular:     Rate and Rhythm: Normal rate and regular rhythm.  Pulmonary:     Effort: Pulmonary effort is normal. No respiratory distress.     Breath sounds: Normal breath sounds. No stridor. No wheezing or rales.  Abdominal:     General: Bowel sounds are normal. There is no distension.     Palpations: Abdomen is soft.     Tenderness: There is no abdominal tenderness. There is no guarding or rebound.  Musculoskeletal:        General: No tenderness.     Cervical back: Neck supple.  Skin:    General: Skin is warm and dry.     Findings: No rash.  Neurological:     Mental Status: She is alert.     Cranial Nerves: No cranial nerve deficit (no facial droop, extraocular movements intact, no slurred speech).     Sensory: No sensory deficit.     Motor: No abnormal muscle tone or seizure activity.     Coordination: Coordination normal.     ED Results / Procedures / Treatments   Labs (all labs ordered are listed, but only abnormal results are displayed) Labs Reviewed  BASIC METABOLIC PANEL - Abnormal; Notable for the following components:      Result Value   Sodium 123 (*)    Potassium 3.3 (*)    Chloride 85 (*)    Glucose, Bld 102 (*)    Calcium 8.8 (*)    All other components within normal limits  CBC WITH DIFFERENTIAL/PLATELET - Abnormal; Notable for the following components:   WBC 12.4 (*)    RBC 3.82 (*)     Neutro Abs 9.4 (*)    Monocytes Absolute 1.2 (*)    All other components within normal limits  CBG MONITORING, ED - Abnormal; Notable for the following components:   Glucose-Capillary 138 (*)    All other components within normal limits    EKG EKG Interpretation  Date/Time:  Saturday August 26 2019 16:03:54 EDT Ventricular Rate:  77 PR Interval:    QRS Duration: 90 QT Interval:  416 QTC Calculation: 471 R Axis:   41 Text Interpretation: Sinus rhythm Low voltage, precordial leads No old tracing to compare Confirmed by Linwood Dibbles [phone]) on 08/26/2019 4:18:09 PM   Radiology No results found.  Procedures Procedures (including critical care time)  Medications Ordered in ED Medications  sodium chloride 0.9 % bolus 1,000 mL (0 mLs Intravenous Stopped 08/26/19 1859)    And  0.9 %  sodium chloride infusion (has no administration in time range)    ED Course  I have reviewed the triage vital signs and the nursing notes.  Pertinent labs & imaging results that were available during my care of the patient were reviewed by me and considered in my medical decision making (see chart for details).  Clinical Course as of Aug 25 1898  Sat Aug 26, 2019  1844 Labs reviewed.  WBC elevated.  Electrolytes notable for hyponatremia.  Old labs reviewed.  Similar values in the past   [JK]  1857 Patient reexamined.  No abdominal tenderness.  She states she feels better after having a bowel movement.   [JK]    Clinical Course User Index [JK] Linwood Dibbles, MD   MDM Rules/Calculators/A&P  Pt presented to the ED for evaluation of a syncopal episode.  Symptoms suggestive of vasovagal event.  Patient was initially hypotensive EMS arrival.  Here in the ED she has been normotensive and asymptomatic.  Patient's labs are notable for slight increase in her white blood cell count but she is not having any abdominal tenderness.  I doubt diverticulitis colitis or other acute emergency  regarding her abdomen.  Patient does have hyponatremia.  She has had this in the past.  Patient states previously it occurred when she was on a diuretic.  She states her doctor started her on a diuretic again recently to help with her blood pressure.  I do not have the name of this medication listed but I have asked her to stop her diuretic medication.  Patient was given normal saline IV.  She is not confused.  She feels well and like to go home.  I do not think she needs be admitted for hyponatremia.  Discussed cessation of her diuretic and have her follow-up with her doctor to have that rechecked this week. Final Clinical Impression(s) / ED Diagnoses Final diagnoses:  Hyponatremia  Near syncope    Rx / DC Orders ED Discharge Orders    None       Linwood Dibbles, MD 08/26/19 [phone]

## 2019-08-28 ENCOUNTER — Telehealth (HOSPITAL_COMMUNITY): Payer: Self-pay | Admitting: Physical Therapy

## 2019-08-28 ENCOUNTER — Other Ambulatory Visit: Payer: Self-pay

## 2019-08-28 ENCOUNTER — Encounter: Payer: Self-pay | Admitting: Orthopedic Surgery

## 2019-08-28 ENCOUNTER — Ambulatory Visit (INDEPENDENT_AMBULATORY_CARE_PROVIDER_SITE_OTHER): Payer: 59 | Admitting: Orthopedic Surgery

## 2019-08-28 VITALS — BP 158/91 | HR 92 | Ht 66.0 in | Wt 153.0 lb

## 2019-08-28 DIAGNOSIS — M5410 Radiculopathy, site unspecified: Secondary | ICD-10-CM | POA: Diagnosis not present

## 2019-08-28 DIAGNOSIS — M5136 Other intervertebral disc degeneration, lumbar region: Secondary | ICD-10-CM

## 2019-08-28 NOTE — Progress Notes (Signed)
Chief Complaint  Patient presents with  . Back Pain    has increased pain/ wants MRI     Encounter Diagnoses  Name Primary?  . Radicular pain of right lower extremity Yes  . DDD (degenerative disc disease), lumbar     59 year old with degenerative disc disease radicular right leg pain currently on gabapentin and diclofenac history of prior injections in the back.  I sent her for physical therapy to see if we can control her pain and if not we both agree that she should have an MRI she is here for follow-up  Review of systems dizziness, had syncope presented to the ER  Still having back pain and getting worse with leg pain as well   Physical Exam Vitals and nursing note reviewed.  Constitutional:      Appearance: Normal appearance.  Musculoskeletal:     Lumbar back: Spasms, tenderness and bony tenderness present. No swelling, edema, deformity, signs of trauma or lacerations. Decreased range of motion. Positive right straight leg raise test. Negative left straight leg raise test. Scoliosis present.  Neurological:     Mental Status: She is alert and oriented to person, place, and time.     Motor: No weakness, tremor, atrophy or abnormal muscle tone.     Gait: Gait is intact.     Deep Tendon Reflexes: Babinski sign absent on the right side. Babinski sign absent on the left side.     Reflex Scores:      Patellar reflexes are 2+ on the right side and 2+ on the left side.      Achilles reflexes are 2+ on the right side and 2+ on the left side. Psychiatric:        Mood and Affect: Mood normal.      Straight leg raise on the right was positive  See prior notes for full history and physical  Recommend MRI lumbar spine I will call her with results she will continue with gabapentin and meloxicam

## 2019-08-28 NOTE — Telephone Encounter (Signed)
Pt's saw MD and he has ordered MRI -pt request to be D/c today from PT.

## 2019-08-28 NOTE — Patient Instructions (Addendum)
Schedule MRI   Dr Romeo Apple will call with the results of the scan and further plans

## 2019-08-29 ENCOUNTER — Ambulatory Visit (INDEPENDENT_AMBULATORY_CARE_PROVIDER_SITE_OTHER): Payer: 59 | Admitting: Internal Medicine

## 2019-08-29 ENCOUNTER — Encounter: Payer: Self-pay | Admitting: Internal Medicine

## 2019-08-29 ENCOUNTER — Telehealth: Payer: Self-pay | Admitting: Internal Medicine

## 2019-08-29 VITALS — BP 140/76 | HR 87 | Temp 98.3°F | Ht 66.0 in | Wt 154.8 lb

## 2019-08-29 DIAGNOSIS — R9389 Abnormal findings on diagnostic imaging of other specified body structures: Secondary | ICD-10-CM | POA: Diagnosis not present

## 2019-08-29 DIAGNOSIS — F17200 Nicotine dependence, unspecified, uncomplicated: Secondary | ICD-10-CM | POA: Diagnosis not present

## 2019-08-29 DIAGNOSIS — J449 Chronic obstructive pulmonary disease, unspecified: Secondary | ICD-10-CM | POA: Diagnosis not present

## 2019-08-29 MED ORDER — BUPROPION HCL ER (SR) 150 MG PO TB12: 150.0000 mg | ORAL_TABLET | Freq: Two times a day (BID) | ORAL | 5 refills | Status: DC

## 2019-08-29 MED ORDER — ALBUTEROL SULFATE HFA 108 (90 BASE) MCG/ACT IN AERS: 2.0000 | INHALATION_SPRAY | Freq: Four times a day (QID) | RESPIRATORY_TRACT | 5 refills | Status: AC | PRN

## 2019-08-29 MED ORDER — STIOLTO RESPIMAT 2.5-2.5 MCG/ACT IN AERS: 2.0000 | INHALATION_SPRAY | Freq: Every day | RESPIRATORY_TRACT | 5 refills | Status: DC

## 2019-08-29 NOTE — Patient Instructions (Addendum)
The patient should have follow up scheduled with myself in 6 weeks. Can be a televisit.   Schedule Full set of PFTs  We still start coming down on your Celexa. Start taking 10 mg (which is half a tablet) for the next week and then start taking 10 mg every other day for another week. Then can discontinue. Start taking wellbutrin after that.   Bupropion -- Bupropion (brand names: Zyban, Wellbutrin) is an antidepressant that can be used to help you stop smoking.  Start taking it once per day for three days, then increase to twice daily starting four weeks before the quit date.  You should typically continue for 7 to 12 weeks after you quit smoking.  Please do not stop taking this medication abruptly.  When you are ready to stop we will have you go to once daily for two weeks and then you can do once every other day for a week before you stop.   Bupropion is generally well-tolerated, but it may cause dry mouth and difficulty sleeping. The drug should not be used by people who have a seizure disorder or bipolar (manic-depressive) disorder, and it is not recommended for those who have head trauma, anorexia nervosa, or bulimia, or for those who drink alcohol excessively.  Start taking Stiolto Once a day for your breathing. This is the controller medication.   Take the albuterol rescue inhaler every 4 to 6 hours as needed for wheezing or shortness of breath. You can also take it 15 minutes before exercise or exertional activity. Side effects include heart racing or pounding, jitters or anxiety. If you have a history of an irregular heart rhythm, it can make this worse. Can also give some patients a hard time sleeping.  To inhale the aerosol using an inhaler, follow these steps:  1. Remove the protective dust cap from the end of the mouthpiece. If the dust cap was not placed on the mouthpiece, check the mouthpiece for dirt or other objects. Be sure that the canister is fully and firmly inserted in the  mouthpiece. 2. If you are using the inhaler for the first time or if you have not used the inhaler in more than 14 days, you will need to prime it. You may also need to prime the inhaler if it has been dropped. Ask your pharmacist or check the manufacturer's information if this happens. To prime the inhaler, shake it well and then press down on the canister 4 times to release 4 sprays into the air, away from your face. Be careful not to get albuterol in your eyes. 3. Shake the inhaler well. 4. Breathe out as completely as possible through your mouth. 4. Hold the canister with the mouthpiece on the bottom, facing you and the canister pointing upward. Place the open end of the mouthpiece into your mouth. Close your lips tightly around the mouthpiece. 6. Breathe in slowly and deeply through the mouthpiece.At the same time, press down once on the container to spray the medication into your mouth. 7. Try to hold your breath for 10 seconds. remove the inhaler, and breathe out slowly. 8. If you were told to use 2 puffs, wait 1 minute and then repeat steps 3-7. 9. Replace the protective cap on the inhaler. 10. Clean your inhaler regularly. Follow the manufacturer's directions carefully and ask your doctor or pharmacist if you have any questions about cleaning your inhaler.  Check the back of the inhaler to keep track of the total number of  doses left on the inhaler.      Understanding COPD   What is COPD? COPD stands for chronic obstructive pulmonary (lung) disease. COPD is a general term used for several lung diseases.  COPD is an umbrella term and encompasses other  common diseases in this group like chronic bronchitis and emphysema. Chronic asthma may also be included in this group. While some patients with COPD have only chronic bronchitis or emphysema, most patients have a combination of both.  You might hear these terms used in exchange for one another.   COPD adds to the work of the heart.  Diseased lungs may reduce the amount of oxygen that goes to the blood. High blood pressure in blood vessels from the heart to the lungs makes it difficult for the heart to pump. Lung disease can also cause the body to produce too many red blood cells which may make the blood thicker and harder to pump.   Patients who have COPD with low oxygen levels may develop an enlarged heart (cor pulmonale). This condition weakens the heart and causes increased shortness of breath and swelling in the legs and feet.   Chronic bronchitis Chronic bronchitis is irritation and inflammation (swelling) of the lining in the bronchial tubes (air passages). The irritation causes coughing and an excess amount of mucus in the airways. The swelling makes it difficult to get air in and out of the lungs. The small, hair-like structures on the inside of the airways (called cilia) may be damaged by the irritation. The cilia are then unable to help clean mucus from the airways.  Bronchitis is generally considered to be chronic when you have: a productive cough (cough up mucus) and shortness of breath that lasts about 3 months or more each year for 2 or more years in a row. Your doctor may define chronic bronchitis differently.   Emphysema Emphysema is the destruction, or breakdown, of the walls of the alveoli (air sacs) located at the end of the bronchial tubes. The damaged alveoli are not able to exchange oxygen and carbon dioxide between the lungs and the blood. The bronchioles lose their elasticity and collapse when you exhale, trapping air in the lungs. The trapped air keeps fresh air and oxygen from entering the lungs.   Who is affected by COPD? Emphysema and chronic bronchitis affect approximately 16 million people in the [place], or close to 11 percent of the population.   Symptoms of COPD   Shortness of breath   Shortness of breath with mild exercise (walking, using the stairs, etc.)   Chronic, productive cough  (with mucus)   A feeling of "tightness" in the chest   Wheezing   What causes COPD? The two primary causes of COPD are cigarette smoking and alpha1-antitrypsin (AAT) deficiency. Air pollution and occupational dusts may also contribute to COPD, especially when the person exposed to these substances is a cigarette smoker.  Cigarette smoke causes COPD by irritating the airways and creating inflammation that narrows the airways, making it more difficult to breathe. Cigarette smoke also causes the cilia to stop working properly so mucus and trapped particles are not cleaned from the airways. As a result, chronic cough and excess mucus production develop, leading to chronic bronchitis.  In some people, chronic bronchitis and infections can lead to destruction of the small airways, or emphysema.  AAT deficiency, an inherited disorder, can also lead to emphysema. Alpha antitrypsin (AAT) is a protective material produced in the liver and transported to  the lungs to help combat inflammation. When there is not enough of the chemical AAT, the body is no longer protected from an enzyme in the white blood cells.   How is COPD diagnosed?  To diagnose COPD, the physician needs to know: . Do you smoke?  . Have you had chronic exposure to dust or air pollutants?  . Do other members of your family have lung disease?  Marland Kitchen Are you short of breath?  . Do you get short of breath with exercise?  Marland Kitchen Do you have chronic cough and/or wheezing?  Marland Kitchen Do you cough up excess mucus?  To help with the diagnosis, the physician will conduct a thorough physical exam which includes:  1. Listening to your lungs and heart  2. Checking your blood pressure and pulse  3. Examining your nose and throat  4. Checking your feet and ankles for swelling   Laboratory and other tests Several laboratory and other tests are needed to confirm a diagnosis of COPD. These tests may include:  . Chest X-ray to look for lung changes that could be  caused by COPD  .  Spirometry and pulmonary function tests (PFTs) to determine lung volume and air flow  . Pulse oximetry to measure the saturation of oxygen in the blood  . Arterial blood gases (ABGs) to determine the amount of oxygen and carbon dioxide in the blood  . Exercise testing to determine if the oxygen level in the blood drops during exercise   Treatment In the beginning stages of COPD, there is minimal shortness of breath that may be noticed only during exercise. As the disease progresses, shortness of breath may worsen and you may need to wear an oxygen device.   To help control other symptoms of COPD, the following treatments and lifestyle changes may be prescribed.  . Quitting smoking  . Avoiding cigarette smoke and other irritants  . Taking medications including: a. bronchodilators b. anti-inflammatory agents c. oxygen d. antibiotics  . Maintaining a healthy diet  . Following a structured exercise program such as pulmonary rehabilitation . Preventing respiratory infections  . Controlling stress   If your COPD progresses, you may be eligible to be evaluated for lung volume reduction surgery or lung transplantation. You may also be eligible to participate in certain clinical trials (research studies). Ask your health care providers about studies being conducted in your hospital.   What is the outlook? Although COPD can not be cured, its symptoms can be treated and your quality of life can be improved. Your prognosis or outlook for the future will depend on how well your lungs are functioning, your symptoms, and how well you respond to and follow your treatment plan.   What are the benefits of quitting smoking? Quitting smoking can lower your chances of getting or dying from heart disease, lung disease, kidney failure, infection, or cancer. It can also lower your chances of getting osteoporosis, a condition that makes your bones weak. Plus, quitting smoking can help your skin  look younger and reduce the chances that you will have problems with sex.  Quitting smoking will improve your health no matter how old you are, and no matter how long or how much you have smoked.  What should I do if I want to quit smoking? The letters in the word "START" can help you remember the steps to take: S = Set a quit date. T = Tell family, friends, and the people around you that you plan to quit.  A = Anticipate or plan ahead for the tough times you'll face while quitting. R = Remove cigarettes and other tobacco products from your home, car, and work. T = Talk to your doctor about getting help to quit.  How can my doctor or nurse help? Your doctor or nurse can give you advice on the best way to quit. He or she can also put you in touch with counselors or other people you can call for support. Plus, your doctor or nurse can give you medicines to: ?Reduce your craving for cigarettes ?Reduce the unpleasant symptoms that happen when you stop smoking (called "withdrawal symptoms"). You can also get help from a free phone line (1-800-QUIT-NOW) or go online to [employer]dkwww.smokefree.gov.  What are the symptoms of withdrawal? The symptoms include: ?Trouble sleeping ?Being irritable, anxious or restless ?Getting frustrated or angry ?Having trouble thinking clearly  Some people who stop smoking become temporarily depressed. Some people need treatment for depression, such as counseling or antidepressant medicines. Depressed people might: ?No longer enjoy or care about doing the things they used to like to do ?Feel sad, down, hopeless, nervous, or cranky most of the day, almost every day ?Lose or gain weight ?Sleep too much or too little ?Feel tired or like they have no energy ?Feel guilty or like they are worth nothing ?Forget things or feel confused ?Move and speak more slowly than usual ?Act restless or have trouble staying still ?Think about death or suicide  If you think you might be  depressed, see your doctor or nurse. Only someone trained in mental health can tell for sure if you are depressed. If you ever feel like you might hurt yourself, go straight to the nearest emergency department. Or you can call for an ambulance (in the [place] and [place], dial 9-1-1) or call your doctor or nurse right away and tell them it is an emergency. You can also reach the [place] National Suicide Prevention Lifeline at 671-853-37331-409-773-4036 or http://hill.com/www.suicidepreventionlifeline.org.  How do medicines help you stop smoking? Different medicines work in different ways: ?Nicotine replacement therapy eases withdrawal and reduces your body's craving for nicotine, the main drug found in cigarettes. There are different forms of nicotine replacement, including skin patches, lozenges, gum, nasal sprays, and "puffers" or inhalers. Many can be bought without a prescription, while others might require one. ?Bupropion is a prescription medicine that reduces your desire to smoke. This medicine is sold under the brand names Zyban and Wellbutrin. It is also available in a generic version, which is cheaper than brand name medicines. ?Varenicline (brand names: Chantix, Champix) is a prescription medicine that reduces withdrawal symptoms and cigarette cravings. If you think you'd like to take varenicline and you have a history of depression, anxiety, or heart disease, discuss this with your doctor or nurse before taking the medicine. Varenicline can also increase the effects of alcohol in some people. It's a good idea to limit drinking while you're taking it, at least until you know how it affects you.  How does counseling work? Counseling can happen during formal office visits or just over the phone. A counselor can help you: ?Figure out what triggers your smoking and what to do instead ?Overcome cravings ?Figure out what went wrong when you tried to quit before  What works best? Studies show that people have the best luck at quitting  if they take medicines to help them quit and work with a Veterinary surgeoncounselor. It might also be helpful to combine nicotine replacement with one  of the prescription medicines that help people quit. In some cases, it might even make sense to take bupropion and varenicline together.  What about e-cigarettes? Sometimes people wonder if using electronic cigarettes, or "e-cigarettes," might help them quit smoking. Using e-cigarettes is also called "vaping." Doctors do not recommend e-cigarettes in place of medicines and counseling. That's because e-cigarettes still contain nicotine as well as other substances that might be harmful. It's not clear how they can affect a person's health in the long term.  Will I gain weight if I quit? Yes, you might gain a few pounds. But quitting smoking will have a much more positive effect on your health than weighing a few pounds more. Plus, you can help prevent some weight gain by being more active and eating less. Taking the medicine bupropion might help control weight gain.   What else can I do to improve my chances of quitting? You can: ?Start exercising. ?Stay away from smokers and places that you associate with smoking. If people close to you smoke, ask them to quit with you. ?Keep gum, hard candy, or something to put in your mouth handy. If you get a craving for a cigarette, try one of these instead. ?Don't give up, even if you start smoking again. It takes most people a few tries before they succeed.  What if I am pregnant and I smoke? If you are pregnant, it's really important for the health of your baby that you quit. Ask your doctor what options you have, and what is safest for your baby

## 2019-08-29 NOTE — Telephone Encounter (Signed)
Pt called back with list of acceptable inhalers from insurance company. They are:  fluticafome- salmeterol Ipratropium- albuteral advair diskus advair hfa anoro ellitta breo ellipta Budesonide- formoterol fumarapa trelegy ellipta

## 2019-08-29 NOTE — Telephone Encounter (Signed)
Spoke with a Associate Professor at [employer]. States that [employer] is not covered by her insurance. There is also an interaction between Wellbutrin and Citalopram. Pharmacy tech has already spoke to the pt and she states that Citalopram is being discontinued by one of her providers.  Contacted the pt. She is going to contact her insurance company and find out what inhalers are covered by her drug plan. Will await her call back.

## 2019-08-29 NOTE — Telephone Encounter (Signed)
The following are preferred inhalers since the Stiolto is not covered:  fluticafome- salmeterol Ipratropium- albuteral advair diskus advair hfa anoro ellitta breo ellipta Budesonide- formoterol fumarapa trelegy ellipta  Please advise if any of these are appropriate substitutes, thanks

## 2019-08-29 NOTE — Progress Notes (Signed)
Heather Davidson    [id number]    [birth year]  Primary Care Physician:Fagan, Channing Mutters, MD  Referring Physician: Carylon Perches, MD [address] [place],  [place] [address] Reason for Consultation: cough Date of Consultation: 08/29/2019  Chief complaint:   Chief Complaint  Patient presents with  . Consult    Abnormal CT, ground glass opacity.  pna in 2020.  Cough since pna thick clear.     HPI: She presents today for new patient evaluation of shortness of breath and cough. Started in October 2020 whenshe was diagnosed with pneumonia and treated as an outpatient. She managed her symptoms at home, was treated with azithromycin which gave her nausea. She was not treated with prednisone. Has had prednisone for bronchitis in the past but none recently. She denies wheezing. More recently she is having dyspnea with ADLS like taking out the trash.   Cough is worse first thing in the morning and again later in the afternoon. Cough is productive with white sputum, no blood.   She had two falls this weekend in the setting of drinking alcohol and using hydrocodone and being on diuretics. She has DJD and has lower extremity pain and back pain undergoing work up.   Dad had mesothelioma from asbestos exposure in a shipyard during WW2. Mother had COPD.     Social history:  Occupation: She works as a Diplomatic Services operational officer at [employer]: lives at home with husband. Lives in [place].  Smoking history: 1 ppd smoker sometimes more x 33 years - 33 pack year smoking history.   Social History   Occupational History    Employer: [employer]  Tobacco Use  . Smoking status: Current Every Day Smoker    Packs/day: 1.00    Years: 25.00    Pack years: 25.00    Types: Cigarettes  . Smokeless tobacco: Never Used  Substance and Sexual Activity  . Alcohol use: Yes    Alcohol/week: 0.0 standard drinks    Comment: 4 glass of red wine a night   . Drug use: No  . Sexual activity: Not on file     Relevant family history:  Family History  Problem Relation Age of Onset  . Lung cancer Father        asbestosis  . COPD Mother        Died 29  . Colon cancer Neg Hx     Past Medical History:  Diagnosis Date  . Anxiety   . Clostridium difficile colitis 12/2010  . Depression   . GERD (gastroesophageal reflux disease)   . HTN (hypertension)    physician stopped BP med  . Seasonal allergies     Past Surgical History:  Procedure Laterality Date  . ABDOMINAL HYSTERECTOMY    . CARPAL TUNNEL RELEASE Right 01/07/2018   Procedure: RIGHT CARPAL TUNNEL RELEASE;  Surgeon: Cindee Salt, MD;  Location: [facility];  Service: Orthopedics;  Laterality: Right;  FAB  . CERVICAL CONIZATION W/BX     for precervical cancer in remote past  . CESAREAN SECTION    . COLONOSCOPY  09/2007   anal papilla, normal colon and terminal ileum  . COLONOSCOPY WITH PROPOFOL N/A 02/10/2018   Procedure: COLONOSCOPY WITH PROPOFOL;  Surgeon: Corbin Ade, MD;  [facility];  Service: Endoscopy;  Laterality: N/A;  1:30pm  . complete hysterectomy    . ESOPHAGOGASTRODUODENOSCOPY  09/2007   solitary inlet patch (bx neg), antral erosions  .  TUBAL LIGATION     Review of systems: Review of Systems  Constitutional: Negative for chills, fever and weight loss.  HENT: Negative for congestion, sinus pain and sore throat.   Eyes: Negative for discharge and redness.  Respiratory: Positive for cough and shortness of breath. Negative for hemoptysis, sputum production and wheezing.   Cardiovascular: Negative for chest pain, palpitations and leg swelling.  Gastrointestinal: Negative for heartburn, nausea and vomiting.  Musculoskeletal: Positive for joint pain. Negative for myalgias.  Skin: Negative for rash.  Neurological: Negative for dizziness, tremors, focal weakness and headaches.  Endo/Heme/Allergies: Negative for environmental allergies.  Psychiatric/Behavioral: Negative for depression. The  patient is not nervous/anxious.   All other systems reviewed and are negative.   Physical Exam: Blood pressure 140/76, pulse 87, temperature 98.3 F (36.8 C), temperature source Temporal, height 5\' 6"  (1.676 m), weight 154 lb 12.8 oz (70.2 kg), SpO2 100 %. Gen:      No acute distress Lungs:    No increased respiratory effort, symmetric chest wall excursion, diminished breath sounds bilaterally, no wheezes or crackles CV:         Regular rate and rhythm; no murmurs, rubs, or gallops.  No pedal edema Abd:      + bowel sounds; soft, non-tender; no distension MSK: no acute synovitis of DIP or PIP joints, no mechanics hands.  Skin:      Warm and dry; no rashes Neuro: normal speech, no focal facial asymmetry Psych: alert and oriented x3, normal mood and affect   Data Reviewed/Medical Decision Making:  Independent interpretation of tests: Imaging: . Review of patient's CT scans from Dec 2020 and March 2021 reviewed.  images revealed improved lower lobe predominant groundglass opacities most consistent with a viral pneumonia.  There is persistence of middle and lower lobe predominant mild bronchiectasis. The patient's images have been independently reviewed by me.    PFTs: None on file  Labs:  Lab Results  Component Value Date   WBC 12.4 (H) 08/26/2019   HGB 13.0 08/26/2019   HCT 36.4 08/26/2019   MCV 95.3 08/26/2019   PLT 306 08/26/2019   Lab Results  Component Value Date   NA 123 (L) 08/26/2019   K 3.3 (L) 08/26/2019   CL 85 (L) 08/26/2019   CO2 26 08/26/2019     Immunization status:  Immunization History  Administered Date(s) Administered  . Influenza, High Dose Seasonal PF 02/01/2019  . Moderna SARS-COVID-2 Vaccination 07/28/2019  . Zoster Recombinat (Shingrix) 02/02/2017, 06/01/2017    . I reviewed prior external note(s) from Dr. Willey Blade, ED visits . I reviewed the result(s) of the labs and imaging as noted above.  . I have ordered PFTs  Assessment:  Tobacco Use  Disorder Abnormal CT Chest - mild bronchiectasis (and ground glass opacities which have improved.) Differential includes smoking related ILD, COPD, less likely HP. This is not a UIP or NSIP pattern ILD.  COPD - new diagnosis  I personally spent 7 minutes counseling the patient regarding tobacco use disorder.  Patient is symptomatic from tobacco use disorder due to the following condition: Chronic bronchitis.  The patient's response was pre- contemplative.  We discussed nicotine replacement therapy, Wellbutrin, Chantix.  We identified to gather patient specific barriers to change.  The patient is open to future discussions about tobacco cessation.    Plan/Recommendations: We discussed COPD disease progression management at length today.  Smoking cessation is paramount in this management.  She is contemplative and interested in quitting and would like  to try Wellbutrin.  We will taper her Celexa over the next 2 weeks and she will then start Wellbutrin.  We will obtain a full set of PFTs  We will start LAMA-LABA today, with as needed albuterol  I spent 62 minutes in the care of this patient today including pre-charting, chart review, review of results, face-to-face care, coordination of care and communication with consultants etc.).  Return to Care: Return in about 6 weeks (around 10/10/2019).  Durel Salts, MD Pulmonary and Critical Care Medicine [facility] Office:[phone]  CC: Carylon Perches, MD

## 2019-08-30 ENCOUNTER — Ambulatory Visit (HOSPITAL_COMMUNITY): Payer: 59 | Admitting: Physical Therapy

## 2019-08-30 ENCOUNTER — Ambulatory Visit: Payer: 59

## 2019-08-30 MED ORDER — ANORO ELLIPTA 62.5-25 MCG/INH IN AEPB: 1.0000 | INHALATION_SPRAY | Freq: Every day | RESPIRATORY_TRACT | 5 refills | Status: DC

## 2019-08-30 NOTE — Telephone Encounter (Signed)
Anoro would be ok. I have sent to pharmacy

## 2019-08-30 NOTE — Telephone Encounter (Signed)
LMTCB for the pt x 1  

## 2019-08-31 ENCOUNTER — Other Ambulatory Visit: Payer: 59 | Admitting: Adult Health

## 2019-08-31 NOTE — Telephone Encounter (Signed)
ATC patient per DPR  Left detailed message letting patient know that Anoro has been sent to her pharmacy by Dr. Celine Mans. Nothing further needed at this time.

## 2019-09-05 ENCOUNTER — Ambulatory Visit: Payer: 59

## 2019-09-06 ENCOUNTER — Encounter (HOSPITAL_COMMUNITY): Payer: Self-pay | Admitting: Physical Therapy

## 2019-09-12 ENCOUNTER — Telehealth: Payer: Self-pay | Admitting: Internal Medicine

## 2019-09-12 DIAGNOSIS — J410 Simple chronic bronchitis: Secondary | ICD-10-CM

## 2019-09-12 MED ORDER — FLUTICASONE-SALMETEROL 250-50 MCG/DOSE IN AEPB
1.0000 | INHALATION_SPRAY | Freq: Two times a day (BID) | RESPIRATORY_TRACT | 5 refills | Status: DC
Start: 2019-09-12 — End: 2020-01-26

## 2019-09-12 MED ORDER — BREO ELLIPTA 200-25 MCG/INH IN AEPB: 1.0000 | INHALATION_SPRAY | Freq: Every day | RESPIRATORY_TRACT | 3 refills | Status: DC

## 2019-09-12 NOTE — Telephone Encounter (Signed)
Spoke with pt, aware of recs. Pt states that her pharmacy has already called her to let her know that Virgel Bouquet is not preferred by insurance, will cost over $200/30 day supply.  Pharmacist states that generic Advair is preferred by insurance.    Dr. Celine Mans please advise if ok to switch to generic Advair.  Thanks!

## 2019-09-12 NOTE — Telephone Encounter (Signed)
Spoke with pt, she states she is losing her voice since starting Anoro. She states she does wash her mouth out after every use. The list of preferred medications are below. Please advise on which inhaler she can use instead of Anoro.   The following are preferred inhalers since the Stiolto is not covered:  fluticafome- salmeterol Ipratropium- albuteral advair diskus advair hfa anoro ellitta breo ellipta Budesonide- formoterol fumarapa trelegy ellipta  Please advise if any of these are appropriate substitutes, thanks

## 2019-09-12 NOTE — Telephone Encounter (Signed)
advair generic is fine. sent

## 2019-09-12 NOTE — Telephone Encounter (Signed)
Spoke with pt, aware of recs.  rx sent to preferred pharmacy.  Nothing further needed at this time- will close encounter.   

## 2019-09-12 NOTE — Telephone Encounter (Signed)
Please let her know that all inhalers will have the side effect of losing her voice, and this becomes even more likely with steroid inhalers.   I recommend she not just rinse her mouth but actually gargle - with an alcohol-based mouthwash. Need to make sure to check the label on the back to ensure it has alcohol in it since most mouthwashes are now alcohol- free.  Ok to stop anoro and can try once daily Breo. Sent to pharmacy

## 2019-09-12 NOTE — Telephone Encounter (Signed)
Duplicate encounter- see other phone note dated for today.

## 2019-09-13 ENCOUNTER — Encounter (HOSPITAL_COMMUNITY): Payer: Self-pay | Admitting: Physical Therapy

## 2019-09-14 ENCOUNTER — Telehealth: Payer: Self-pay | Admitting: Orthopedic Surgery

## 2019-09-14 NOTE — Telephone Encounter (Signed)
Notified patient; voiced understanding. Aware to bring in the [facility] card in order for a new pre-authorization to be started.

## 2019-09-14 NOTE — Telephone Encounter (Signed)
When she gets card she will have to get it to me.   Thanks

## 2019-09-14 NOTE — Telephone Encounter (Signed)
Patient called to relay the following information:  (1) states her MRI appointment(s) at [facility] have been rescheduled from May date(s) to 10/05/19; states she requested to have the spine MRI ordered by Dr Romeo Apple done at the same time as the brain MRI's ordered by PCP  (2) states she is changing insurance from [facility] which has already pre-authorized MRI to [employer]  (3) states [facility] [employer] will term 10/02/19, and that [employer] "gold" will begin 10/03/19    - I relayed that the MRI's would need to be re-approved by her new plan, and to be aware she may not have the new ID card by 10/05/19

## 2019-09-18 ENCOUNTER — Ambulatory Visit (HOSPITAL_COMMUNITY): Payer: 59

## 2019-09-18 ENCOUNTER — Encounter (HOSPITAL_COMMUNITY): Payer: Self-pay

## 2019-09-19 ENCOUNTER — Ambulatory Visit (HOSPITAL_COMMUNITY): Payer: 59

## 2019-09-20 ENCOUNTER — Encounter (HOSPITAL_COMMUNITY): Payer: Self-pay | Admitting: Physical Therapy

## 2019-09-25 ENCOUNTER — Ambulatory Visit (HOSPITAL_COMMUNITY): Payer: 59

## 2019-10-05 ENCOUNTER — Other Ambulatory Visit: Payer: Self-pay

## 2019-10-05 ENCOUNTER — Ambulatory Visit (HOSPITAL_COMMUNITY)
Admission: RE | Admit: 2019-10-05 | Discharge: 2019-10-05 | Disposition: A | Discharge status: Home / Self Care | Payer: BC Managed Care – PPO | Source: Ambulatory Visit | Attending: Internal Medicine | Admitting: Internal Medicine

## 2019-10-05 ENCOUNTER — Ambulatory Visit (HOSPITAL_COMMUNITY): Payer: BC Managed Care – PPO

## 2019-10-05 DIAGNOSIS — R519 Headache, unspecified: Secondary | ICD-10-CM | POA: Diagnosis not present

## 2019-10-09 ENCOUNTER — Other Ambulatory Visit (HOSPITAL_COMMUNITY)
Admission: RE | Admit: 2019-10-09 | Discharge: 2019-10-09 | Disposition: A | Discharge status: Home / Self Care | Payer: BC Managed Care – PPO | Source: Ambulatory Visit | Attending: Pulmonary Disease | Admitting: Pulmonary Disease

## 2019-10-09 DIAGNOSIS — Z01812 Encounter for preprocedural laboratory examination: Secondary | ICD-10-CM | POA: Insufficient documentation

## 2019-10-09 DIAGNOSIS — Z20822 Contact with and (suspected) exposure to covid-19: Secondary | ICD-10-CM | POA: Insufficient documentation

## 2019-10-09 LAB — SARS CORONAVIRUS 2 (TAT 6-24 HRS): SARS Coronavirus 2: NEGATIVE

## 2019-10-12 ENCOUNTER — Encounter: Payer: Self-pay | Admitting: Primary Care

## 2019-10-12 ENCOUNTER — Ambulatory Visit: Payer: 59 | Admitting: Primary Care

## 2019-10-12 ENCOUNTER — Ambulatory Visit (INDEPENDENT_AMBULATORY_CARE_PROVIDER_SITE_OTHER): Payer: BC Managed Care – PPO | Admitting: Internal Medicine

## 2019-10-12 ENCOUNTER — Ambulatory Visit: Payer: 59 | Admitting: Internal Medicine

## 2019-10-12 ENCOUNTER — Other Ambulatory Visit: Payer: Self-pay

## 2019-10-12 VITALS — BP 132/60 | HR 85 | Temp 97.8°F | Ht 66.0 in | Wt 149.2 lb

## 2019-10-12 DIAGNOSIS — R0602 Shortness of breath: Secondary | ICD-10-CM

## 2019-10-12 DIAGNOSIS — F17200 Nicotine dependence, unspecified, uncomplicated: Secondary | ICD-10-CM | POA: Insufficient documentation

## 2019-10-12 DIAGNOSIS — J42 Unspecified chronic bronchitis: Secondary | ICD-10-CM | POA: Diagnosis not present

## 2019-10-12 DIAGNOSIS — K219 Gastro-esophageal reflux disease without esophagitis: Secondary | ICD-10-CM

## 2019-10-12 DIAGNOSIS — J449 Chronic obstructive pulmonary disease, unspecified: Secondary | ICD-10-CM | POA: Diagnosis not present

## 2019-10-12 LAB — PULMONARY FUNCTION TEST
DL/VA % pred: 63 %
DL/VA: 2.65 ml/min/mmHg/L
DLCO cor % pred: 73 %
DLCO cor: 15.98 ml/min/mmHg
DLCO unc % pred: 72 %
DLCO unc: 15.78 ml/min/mmHg
FEF 25-75 Post: 2.96 L/sec
FEF 25-75 Pre: 2.63 L/sec
FEF2575-%Change-Post: 12 %
FEF2575-%Pred-Post: 119 %
FEF2575-%Pred-Pre: 106 %
FEV1-%Change-Post: 1 %
FEV1-%Pred-Post: 113 %
FEV1-%Pred-Pre: 111 %
FEV1-Post: 3.13 L
FEV1-Pre: 3.07 L
FEV1FVC-%Change-Post: 3 %
FEV1FVC-%Pred-Pre: 98 %
FEV6-%Change-Post: -1 %
FEV6-%Pred-Post: 113 %
FEV6-%Pred-Pre: 115 %
FEV6-Post: 3.91 L
FEV6-Pre: 3.96 L
FEV6FVC-%Change-Post: 0 %
FEV6FVC-%Pred-Post: 103 %
FEV6FVC-%Pred-Pre: 103 %
FVC-%Change-Post: -1 %
FVC-%Pred-Post: 109 %
FVC-%Pred-Pre: 111 %
FVC-Post: 3.91 L
FVC-Pre: 3.98 L
Post FEV1/FVC ratio: 80 %
Post FEV6/FVC ratio: 100 %
Pre FEV1/FVC ratio: 77 %
Pre FEV6/FVC Ratio: 99 %
RV % pred: 121 %
RV: 2.54 L
TLC % pred: 121 %
TLC: 6.51 L

## 2019-10-12 LAB — CBC WITH DIFFERENTIAL/PLATELET
Basophils Absolute: 0 10*3/uL (ref 0.0–0.1)
Basophils Relative: 0.4 % (ref 0.0–3.0)
Eosinophils Absolute: 0.1 10*3/uL (ref 0.0–0.7)
Eosinophils Relative: 0.8 % (ref 0.0–5.0)
HCT: 32.2 % — ABNORMAL LOW (ref 36.0–46.0)
Hemoglobin: 10.7 g/dL — ABNORMAL LOW (ref 12.0–15.0)
Lymphocytes Relative: 17 % (ref 12.0–46.0)
Lymphs Abs: 1.7 10*3/uL (ref 0.7–4.0)
MCHC: 33.1 g/dL (ref 30.0–36.0)
MCV: 100.6 fl — ABNORMAL HIGH (ref 78.0–100.0)
Monocytes Absolute: 0.7 10*3/uL (ref 0.1–1.0)
Monocytes Relative: 7 % (ref 3.0–12.0)
Neutro Abs: 7.7 10*3/uL (ref 1.4–7.7)
Neutrophils Relative %: 74.8 % (ref 43.0–77.0)
Platelets: 353 10*3/uL (ref 150.0–400.0)
RBC: 3.2 Mil/uL — ABNORMAL LOW (ref 3.87–5.11)
RDW: 14.8 % (ref 11.5–15.5)
WBC: 10.3 10*3/uL (ref 4.0–10.5)

## 2019-10-12 NOTE — Assessment & Plan Note (Signed)
-   Continue omeprazole 20mg  in the morning - Try over the counter Pepcid at bedtime for persistent reflux

## 2019-10-12 NOTE — Assessment & Plan Note (Signed)
-   Continues to smoke, she is not ready to quit. She has not started Wellbutrin - Strongly encourage smoking cessation - Due for LDCT in Dec 2021

## 2019-10-12 NOTE — Assessment & Plan Note (Addendum)
-   Persistent dyspnea with ADLs. Hx pneumonia in 2020.  - PFTs 10/12/2019 showed no overt obstruction or restriction. There was some evidence of hyperinflation with mid-flow reversibility. Mild diffusion defect  - CT chest 07/25/19 showed spectrum of findings in the lungs which may suggest interstitial lung disease such as chronic hypersensitivity pneumonitis - Breo Ellipta was too expensive, this was changed to Advair 250-84mcg  - Advised patient use Advair one puff TWICE daily (rinse mouth after use) - Checking hypersensitivity panel, CBC with diff and IgE - FU in 3-4 months with Dr. Celine Mans

## 2019-10-12 NOTE — Progress Notes (Signed)
@Patient  ID: Heather Davidson, female    DOB: Dec 25, 1960, 59 y.o.   MRN: [id number]  Chief Complaint  Patient presents with  . Follow-up    PFT today-sob with exertion,esp. with bending over in garden, cough-clear    Referring provider: Asencion Noble, MD  HPI: 59 year old female, current every day smoker. PMH significant for abnormal CT chest, bilateral pneumonia in 2020, degenerative disc disease. Patient of Dr. Shearon Stalls, seen for initial consult on 08/29/19 for shortness of breath and cough. Diagnosed with PNA in October 2020 in the outpatient setting treated with azithromycin but she ahd a reactoin to this and was placed onm a different antibiotic. She did not require oral prednisone. She reports symptoms of dyspnea with ADLS.   10/12/2019 Reports shortness of breath with bending over. States that BREO is too expensive, Advair disc sent to pharmacy as alternative. She was only taking Advair once a day because that it how BREO was previously prescribed to her. She starts coughing around 4pm in the afternoon. Outside in the garden a lot. Eosinophil absolute 100. She takes generic zyrtec daily. PCP started her on Singulair 10mg  daily. She was taking potassium supplement while on diuretic but these have been stopped. She does still have reflux symptoms, worse in the afternoon. She religiously takes omeprazole 20mg  in the morning. She struggles with her weight, her weight went up to close to 200lbs after coming off hormones 1 year ago. She is currently 149lbs. She stopped exercising d/t back pain, she has been very stress out d/t her husband being sick with pneumonia and other things in her life. Typically she has a healthy diet. 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 She has falledn 12 times this year. LDCT in December 2020 showed LUNG RADS 0S, bilateral opacities. Repeat CT chest in March showed regression widespread areas groundf-glass attenuation seen  on prior exam, likely reflective of resolved multilobar pneumonia. There is a spectrum of findings in the lungs which may suggest interstitial lung disease such as chronic hypersensitivity pneumonitis. She is a current smoker, denies vapping. She has not started Wellbutrin yet, she would like to quit smoking after she gets through this current stressful period in her life. Denies knowledge of mold in her house, hot tub, beach house, birds, feather/down. Due for LDCT in December 2021.    Social history: Occupation: She works as a Network engineer at [employer]: lives at home with husband. [place] in [place].  Smoking history: 1 ppd smoker sometimes more x 33 years - 33 pack year smoking history.   PFTs: 10/12/2019 - FVC 3.91 (109%), FEV1 3.13 (113%), ratio 80, TLC 121%, DLCOcor 15.98 (73%) No obstruction. Mild diffusion defect. NO significant BD response, mid-flow reversibility  Imaging: . Review of patient's CT scans from Dec 2020 and March 2021 reviewed.  images revealed improved lower lobe predominant groundglass opacities most consistent with a viral pneumonia.  There is persistence of middle and lower lobe predominant mild bronchiectasis. The patient's images have been independently reviewed by me.   Allergies  Allergen Reactions  . Ciprofloxacin Nausea And Vomiting  . Zithromax [Azithromycin] Nausea And Vomiting    Immunization History  Administered Date(s) Administered  . Influenza, High Dose Seasonal PF 02/01/2019  . Moderna SARS-COVID-2 Vaccination 07/28/2019  . Zoster Recombinat (Shingrix) 02/02/2017, 06/01/2017    Past Medical History:  Diagnosis Date  . Anxiety   . Clostridium difficile colitis 12/2010  . Depression   .  GERD (gastroesophageal reflux disease)   . HTN (hypertension)    physician stopped BP med  . Seasonal allergies     Tobacco History: Social History   Tobacco Use  Smoking Status Current Every Day Smoker  . Packs/day: 1.00  . Years: 25.00  .  Pack years: 25.00  . Types: Cigarettes  Smokeless Tobacco Never Used   Ready to quit: Not Answered Counseling given: Not Answered   Outpatient Medications Prior to Visit  Medication Sig Dispense Refill  . albuterol (VENTOLIN HFA) 108 (90 Base) MCG/ACT inhaler Inhale 2 puffs into the lungs every 6 (six) hours as needed for wheezing or shortness of breath. 18 g 5  . ALPRAZolam (XANAX) 0.5 MG tablet Take 1 tablet (0.5 mg total) by mouth at bedtime as needed for anxiety. 1 tablet 0  . amLODipine (NORVASC) 5 MG tablet Take 5 mg by mouth at bedtime.    Marland Kitchen aspirin 81 MG tablet Take 81 mg by mouth daily.     Marland Kitchen atorvastatin (LIPITOR) 80 MG tablet Take 80 mg by mouth in the morning.     . cetirizine (ZYRTEC) 10 MG tablet Take 10 mg by mouth daily.    . eszopiclone (LUNESTA) 2 MG TABS tablet Take 2 mg by mouth at bedtime. Take immediately before bedtime    . Fluticasone-Salmeterol (ADVAIR DISKUS) 250-50 MCG/DOSE AEPB Inhale 1 puff into the lungs in the morning and at bedtime. 60 each 5  . gabapentin (NEURONTIN) 100 MG capsule Take 1 capsule (100 mg total) by mouth 3 (three) times daily. 90 capsule 2  . ketoconazole (NIZORAL) 2 % cream Apply 1 application topically in the morning and at bedtime. 14d course starting on 08/21/2019 to apply to corners of mouth    . losartan (COZAAR) 100 MG tablet Take 100 mg by mouth daily.      . methocarbamol (ROBAXIN) 500 MG tablet Take 500 mg by mouth 3 (three) times daily.     . montelukast (SINGULAIR) 10 MG tablet Take 10 mg by mouth in the morning.     . Multiple Vitamin (MULTIVITAMIN) capsule Take 1 capsule by mouth daily.    Marland Kitchen omeprazole (PRILOSEC OTC) 20 MG tablet Take 20 mg by mouth in the morning.     . pilocarpine (SALAGEN) 5 MG tablet Take 5 mg by mouth in the morning and at bedtime.    . polyethylene glycol powder (GLYCOLAX/MIRALAX) 17 GM/SCOOP powder Take 17 g by mouth daily as needed for mild constipation or moderate constipation.    . RESTASIS 0.05 %  ophthalmic emulsion Place 1 drop into both eyes 2 (two) times daily.    Marland Kitchen topiramate (TOPAMAX) 25 MG capsule Take 25 mg by mouth in the morning.     . vitamin B-12 (CYANOCOBALAMIN) 1000 MCG tablet Take 1,000 mcg by mouth daily.    Marland Kitchen buPROPion (WELLBUTRIN SR) 150 MG 12 hr tablet Take 1 tablet (150 mg total) by mouth 2 (two) times daily. (Patient not taking: Reported on 10/12/2019) 60 tablet 5  . citalopram (CELEXA) 20 MG tablet Take 20 mg by mouth daily. (Patient not taking: Reported on 10/12/2019)    . diclofenac (VOLTAREN) 50 MG EC tablet Take 1 tablet (50 mg total) by mouth 2 (two) times daily. (Patient not taking: Reported on 10/12/2019) 60 tablet 2  . fluticasone furoate-vilanterol (BREO ELLIPTA) 200-25 MCG/INH AEPB Inhale 1 puff into the lungs daily. (Patient not taking: Reported on 10/12/2019) 1 each 3  . potassium chloride SA (KLOR-CON) 20 MEQ  tablet Take 20 mEq by mouth 2 (two) times daily. (Patient not taking: Reported on 10/12/2019)    . umeclidinium-vilanterol (ANORO ELLIPTA) 62.5-25 MCG/INH AEPB Inhale 1 puff into the lungs daily. (Patient not taking: Reported on 10/12/2019) 1 each 5   No facility-administered medications prior to visit.   Review of Systems  Review of Systems  Constitutional: Negative.   Respiratory: Positive for cough. Negative for chest tightness and wheezing.   Cardiovascular: Negative.    Physical Exam  BP 132/60 (BP Location: Right Arm, Cuff Size: Normal)   Pulse 85   Temp 97.8 F (36.6 C) (Oral)   Ht 5\' 6"  (1.676 m)   Wt 149 lb 3.2 oz (67.7 kg)   SpO2 96%   BMI 24.08 kg/m  Physical Exam Constitutional:      Appearance: Normal appearance.  Cardiovascular:     Rate and Rhythm: Normal rate and regular rhythm.  Pulmonary:     Comments: CTA Skin:    General: Skin is warm and dry.  Neurological:     General: No focal deficit present.     Mental Status: She is alert and oriented to person, place, and time. Mental status is at baseline.  Psychiatric:         Mood and Affect: Mood normal.        Behavior: Behavior normal.        Thought Content: Thought content normal.        Judgment: Judgment normal.      Lab Results:  CBC    Component Value Date/Time   WBC 12.4 (H) 08/26/2019 1705   RBC 3.82 (L) 08/26/2019 1705   HGB 13.0 08/26/2019 1705   HCT 36.4 08/26/2019 1705   PLT 306 08/26/2019 1705   MCV 95.3 08/26/2019 1705   MCH 34.0 08/26/2019 1705   MCHC 35.7 08/26/2019 1705   RDW 13.5 08/26/2019 1705   LYMPHSABS 1.7 08/26/2019 1705   MONOABS 1.2 (H) 08/26/2019 1705   EOSABS 0.1 08/26/2019 1705   BASOSABS 0.0 08/26/2019 1705    BMET    Component Value Date/Time   NA 123 (L) 08/26/2019 1705   K 3.3 (L) 08/26/2019 1705   CL 85 (L) 08/26/2019 1705   CO2 26 08/26/2019 1705   GLUCOSE 102 (H) 08/26/2019 1705   BUN 15 08/26/2019 1705   CREATININE 0.73 08/26/2019 1705   CALCIUM 8.8 (L) 08/26/2019 1705   GFRNONAA >60 08/26/2019 1705   GFRAA >60 08/26/2019 1705    BNP No results found for: BNP  ProBNP No results found for: PROBNP  Imaging: MR BRAIN WO CONTRAST  Result Date: 10/06/2019 CLINICAL DATA:  Headache for 2 months. EXAM: MRI HEAD WITHOUT CONTRAST TECHNIQUE: Multiplanar, multiecho pulse sequences of the brain and surrounding structures were obtained without intravenous contrast. COMPARISON:  None. FINDINGS: Brain: Periventricular and subcortical T2 hyperintensities are mildly advanced for age. There is involvement of the callososeptal margin. Largest lesion is adjacent to the atrium of the right lateral ventricle. Basal ganglia are within normal limits. No focal cortical lesions are present. Acute infarct, hemorrhage, or mass lesion is present. Mild atrophy is present. The ventricles are proportionate to the degree of atrophy. No significant extraaxial fluid collection is present. The internal auditory canals are within normal limits. The brainstem and cerebellum are within normal limits. Vascular: Flow is present in the  major intracranial arteries. Skull and upper cervical spine: The craniocervical junction is normal. Upper cervical spine is within normal limits. Marrow signal is unremarkable. Sinuses/Orbits:  Bilateral mastoid effusions are present. No obstructing nasopharyngeal lesion is evident. The paranasal sinuses and mastoid air cells are otherwise clear. The globes and orbits are within normal limits. IMPRESSION: 1. Periventricular and subcortical T2 hyperintensities are mildly advanced for age. The finding is nonspecific but can be seen in the setting of chronic microvascular ischemia, a demyelinating process such as multiple sclerosis, vasculitis, complicated migraine headaches, or as the sequelae of a prior infectious or inflammatory process. 2. No acute intracranial abnormality. 3. Bilateral mastoid effusions. No obstructing nasopharyngeal lesion is present. Electronically Signed   By: Marin Roberts M.D.   On: 10/06/2019 10:44     Assessment & Plan:   Chronic bronchitis (HCC) - Persistent dyspnea with ADLs. Hx pneumonia in 2020.  - PFTs 10/12/2019 showed no overt obstruction or restriction. There was some evidence of hyperinflation with mid-flow reversibility. Mild diffusion defect  - CT chest 07/25/19 showed spectrum of findings in the lungs which may suggest interstitial lung disease such as chronic hypersensitivity pneumonitis - Breo Ellipta was too expensive, this was changed to Advair 250-64mcg  - Advised patient use Advair one puff TWICE daily (rinse mouth after use) - Checking hypersensitivity panel, CBC with diff and IgE - FU in 3-4 months with Dr. Celine Mans    GERD (gastroesophageal reflux disease) - Continue omeprazole 20mg  in the morning - Try over the counter Pepcid at bedtime for persistent reflux  Current smoker - Continues to smoke, she is not ready to quit. She has not started Wellbutrin - Strongly encourage smoking cessation - Due for LDCT in Dec 2021   >25 mins spent face to  face  Jan 2022, NP 10/12/2019

## 2019-10-12 NOTE — Patient Instructions (Addendum)
Recommendations: - Use Advair one puffs TWICE daily (rinse mouth after use) - Continue omeprazole 20mg  in the morning - Try over the counter Pepcid at bedtime for persistent reflux - When you are ready strongly encourage you try to quit smoking, if you need additional help we have a pharmacist you can schedule a smoking cessation meeting with  Labs: - Hypersensitivity panel, CBC with Diff and IGE  Follow-up: - 4 months with Dr.  - Due for Low dose CT CHEST chest in December 2021     Food Choices for Gastroesophageal Reflux Disease, Adult When you have gastroesophageal reflux disease (GERD), the foods you eat and your eating habits are very important. Choosing the right foods can help ease your discomfort. Think about working with a nutrition specialist (dietitian) to help you make good choices. What are tips for following this plan?  Meals  Choose healthy foods that are low in fat, such as fruits, vegetables, whole grains, low-fat dairy products, and lean meat, fish, and poultry.  Eat small meals often instead of 3 large meals a day. Eat your meals slowly, and in a place where you are relaxed. Avoid bending over or lying down until 2-3 hours after eating.  Avoid eating meals 2-3 hours before bed.  Avoid drinking a lot of liquid with meals.  Cook foods using methods other than frying. Bake, grill, or broil food instead.  Avoid or limit: ? Chocolate. ? Peppermint or spearmint. ? Alcohol. ? Pepper. ? Black and decaffeinated coffee. ? Black and decaffeinated tea. ? Bubbly (carbonated) soft drinks. ? Caffeinated energy drinks and soft drinks.  Limit high-fat foods such as: ? Fatty meat or fried foods. ? Whole milk, cream, butter, or ice cream. ? Nuts and nut butters. ? Pastries, donuts, and sweets made with butter or shortening.  Avoid foods that cause symptoms. These foods may be different for everyone. Common foods that cause symptoms  include: ? Tomatoes. ? Oranges, lemons, and limes. ? Peppers. ? Spicy food. ? Onions and garlic. ? Vinegar. Lifestyle  Maintain a healthy weight. Ask your doctor what weight is healthy for you. If you need to lose weight, work with your doctor to do so safely.  Exercise for at least 30 minutes for 5 or more days each week, or as told by your doctor.  Wear loose-fitting clothes.  Do not smoke. If you need help quitting, ask your doctor.  Sleep with the head of your bed higher than your feet. Use a wedge under the mattress or blocks under the bed frame to raise the head of the bed. Summary  When you have gastroesophageal reflux disease (GERD), food and lifestyle choices are very important in easing your symptoms.  Eat small meals often instead of 3 large meals a day. Eat your meals slowly, and in a place where you are relaxed.  Limit high-fat foods such as fatty meat or fried foods.  Avoid bending over or lying down until 2-3 hours after eating.  Avoid peppermint and spearmint, caffeine, alcohol, and chocolate. This information is not intended to replace advice given to you by your health care provider. Make sure you discuss any questions you have with your health care provider. Document Revised: 08/11/2018 Document Reviewed: 05/26/2016 Elsevier Patient Education  2020 05/28/2016.

## 2019-10-12 NOTE — Progress Notes (Signed)
PFT done today. 

## 2019-10-13 LAB — IGE: IgE (Immunoglobulin E), Serum: 3 kU/L (ref <=114)

## 2019-10-13 NOTE — Progress Notes (Signed)
Please let patient know IgE and eosinophils were normal. She was anemic, follow up with PCP recommended.

## 2019-10-16 LAB — HYPERSENSITIVITY PNEUMONITIS
A. Pullulans Abs: NEGATIVE
A.Fumigatus #1 Abs: NEGATIVE
Micropolyspora faeni, IgG: NEGATIVE
Pigeon Serum Abs: NEGATIVE
Thermoact. Saccharii: NEGATIVE
Thermoactinomyces vulgaris, IgG: NEGATIVE

## 2019-10-16 NOTE — Progress Notes (Signed)
Patient identification verified. Results of recent lab work reviewed.  Per Buelah Manis NP, please let patient know IgE and eosinophils were normal. She was anemic, follow up with PCP recommended.   Patient verbalized understanding of results and plans to call PCP tomorrow to make an appointment.

## 2019-10-19 DIAGNOSIS — Z6823 Body mass index (BMI) 23.0-23.9, adult: Secondary | ICD-10-CM | POA: Diagnosis not present

## 2019-10-19 DIAGNOSIS — D649 Anemia, unspecified: Secondary | ICD-10-CM | POA: Diagnosis not present

## 2019-10-23 ENCOUNTER — Other Ambulatory Visit (HOSPITAL_COMMUNITY): Payer: BC Managed Care – PPO

## 2019-10-24 ENCOUNTER — Telehealth: Payer: Self-pay | Admitting: Orthopedic Surgery

## 2019-10-24 DIAGNOSIS — H903 Sensorineural hearing loss, bilateral: Secondary | ICD-10-CM | POA: Diagnosis not present

## 2019-10-24 DIAGNOSIS — H6983 Other specified disorders of Eustachian tube, bilateral: Secondary | ICD-10-CM | POA: Diagnosis not present

## 2019-10-24 NOTE — Telephone Encounter (Signed)
I called patient, she brought card in, unfortunately no one let me know her insurance changed, she states when she brought in the card she told person she gave it to that she was going to have MRI [id number]  I ran it through [facility] and it is approved [id number]

## 2019-10-24 NOTE — Telephone Encounter (Signed)
Kylie from [facility] called and stated she needs pre authorization on this patient for her MRI.  She now has [employer] instead of [employer].  MRI is scheduled for Thursday, the 24th.    If you have any questions, Kylie's phone number is [phone] ext [phone]  Thanks

## 2019-10-26 ENCOUNTER — Other Ambulatory Visit: Payer: Self-pay

## 2019-10-26 ENCOUNTER — Ambulatory Visit (HOSPITAL_COMMUNITY)
Admission: RE | Admit: 2019-10-26 | Discharge: 2019-10-26 | Disposition: A | Discharge status: Home / Self Care | Payer: BC Managed Care – PPO | Source: Ambulatory Visit | Attending: Orthopedic Surgery | Admitting: Orthopedic Surgery

## 2019-10-26 DIAGNOSIS — M5136 Other intervertebral disc degeneration, lumbar region: Secondary | ICD-10-CM | POA: Insufficient documentation

## 2019-10-26 DIAGNOSIS — M5410 Radiculopathy, site unspecified: Secondary | ICD-10-CM

## 2019-10-26 DIAGNOSIS — M545 Low back pain: Secondary | ICD-10-CM | POA: Diagnosis not present

## 2019-10-30 ENCOUNTER — Telehealth: Payer: Self-pay | Admitting: Orthopedic Surgery

## 2019-10-30 NOTE — Telephone Encounter (Signed)
I have recommended to the patient by phone and I did talk to her that she needs a neurosurgical referral.     T12-L1: Minimal broad-based disc bulge. No evidence of neural foraminal stenosis. No central canal stenosis.  L1-L2: Mild broad-based disc bulge eccentric towards the right. No evidence of neural foraminal stenosis. No central canal stenosis.  L2-L3: Broad-based disc bulge with a right foraminal disc protrusion. Right lateral recess narrowing. Mild bilateral facet arthropathy. No evidence of neural foraminal stenosis. No central canal stenosis.  L3-L4: Mild broad-based disc bulge. Mild bilateral facet arthropathy. No evidence of neural foraminal stenosis. No central canal stenosis.  L4-L5: Broad-based disc bulge with a small left foraminal annular fissure. Mild bilateral facet arthropathy. No evidence of neural foraminal stenosis. No central canal stenosis.  L5-S1: Broad-based disc bulge with a left paracentral annular fissure. Mild bilateral facet arthropathy. No evidence of neural foraminal stenosis. No central canal stenosis.  IMPRESSION: 1. Mild lumbar spine spondylosis as described above. Progressive disc desiccation throughout the lumbar spine compared with the prior examination. Otherwise, no significant interval change compared with 04/21/2013. 2. No acute osseous injury of the lumbar spine.   Electronically Signed   By: Elige Ko

## 2019-10-30 NOTE — Progress Notes (Signed)
T12-L1: Minimal broad-based disc bulge. No evidence of neural foraminal stenosis. No central canal stenosis.  L1-L2: Mild broad-based disc bulge eccentric towards the right. No evidence of neural foraminal stenosis. No central canal stenosis.  L2-L3: Broad-based disc bulge with a right foraminal disc protrusion. Right lateral recess narrowing. Mild bilateral facet arthropathy. No evidence of neural foraminal stenosis. No central canal stenosis.  L3-L4: Mild broad-based disc bulge. Mild bilateral facet arthropathy. No evidence of neural foraminal stenosis. No central canal stenosis.  L4-L5: Broad-based disc bulge with a small left foraminal annular fissure. Mild bilateral facet arthropathy. No evidence of neural foraminal stenosis. No central canal stenosis.  L5-S1: Broad-based disc bulge with a left paracentral annular fissure. Mild bilateral facet arthropathy. No evidence of neural foraminal stenosis. No central canal stenosis.  IMPRESSION: 1. Mild lumbar spine spondylosis as described above. Progressive disc desiccation throughout the lumbar spine compared with the prior examination. Otherwise, no significant interval change compared with 04/21/2013. 2. No acute osseous injury of the lumbar spine.   Electronically Signed   By: Elige Ko

## 2019-10-31 ENCOUNTER — Telehealth: Payer: Self-pay | Admitting: Radiology

## 2019-10-31 DIAGNOSIS — M5410 Radiculopathy, site unspecified: Secondary | ICD-10-CM

## 2019-10-31 NOTE — Telephone Encounter (Signed)
Referral made 

## 2019-10-31 NOTE — Telephone Encounter (Signed)
-----   Message from Vickki Hearing, MD sent at 10/30/2019  3:31 PM EDT ----- Neurosurgery referral

## 2019-11-03 ENCOUNTER — Other Ambulatory Visit: Payer: Self-pay | Admitting: Orthopedic Surgery

## 2019-11-03 DIAGNOSIS — M5136 Other intervertebral disc degeneration, lumbar region: Secondary | ICD-10-CM

## 2019-11-03 DIAGNOSIS — M5410 Radiculopathy, site unspecified: Secondary | ICD-10-CM

## 2019-11-08 ENCOUNTER — Other Ambulatory Visit: Payer: Self-pay | Admitting: Orthopedic Surgery

## 2019-11-08 DIAGNOSIS — M5410 Radiculopathy, site unspecified: Secondary | ICD-10-CM

## 2019-11-17 DIAGNOSIS — R030 Elevated blood-pressure reading, without diagnosis of hypertension: Secondary | ICD-10-CM | POA: Diagnosis not present

## 2019-11-17 DIAGNOSIS — Z6822 Body mass index (BMI) 22.0-22.9, adult: Secondary | ICD-10-CM | POA: Diagnosis not present

## 2019-11-17 DIAGNOSIS — G5730 Lesion of lateral popliteal nerve, unspecified lower limb: Secondary | ICD-10-CM | POA: Insufficient documentation

## 2019-12-05 DIAGNOSIS — R519 Headache, unspecified: Secondary | ICD-10-CM | POA: Diagnosis not present

## 2019-12-05 DIAGNOSIS — J439 Emphysema, unspecified: Secondary | ICD-10-CM | POA: Diagnosis not present

## 2019-12-05 DIAGNOSIS — I10 Essential (primary) hypertension: Secondary | ICD-10-CM | POA: Diagnosis not present

## 2019-12-05 DIAGNOSIS — Z79899 Other long term (current) drug therapy: Secondary | ICD-10-CM | POA: Diagnosis not present

## 2019-12-07 DIAGNOSIS — G5730 Lesion of lateral popliteal nerve, unspecified lower limb: Secondary | ICD-10-CM | POA: Diagnosis not present

## 2019-12-12 DIAGNOSIS — I2510 Atherosclerotic heart disease of native coronary artery without angina pectoris: Secondary | ICD-10-CM | POA: Diagnosis not present

## 2019-12-12 DIAGNOSIS — I10 Essential (primary) hypertension: Secondary | ICD-10-CM | POA: Diagnosis not present

## 2019-12-12 DIAGNOSIS — J449 Chronic obstructive pulmonary disease, unspecified: Secondary | ICD-10-CM | POA: Diagnosis not present

## 2019-12-12 DIAGNOSIS — Z0001 Encounter for general adult medical examination with abnormal findings: Secondary | ICD-10-CM | POA: Diagnosis not present

## 2019-12-12 DIAGNOSIS — E785 Hyperlipidemia, unspecified: Secondary | ICD-10-CM | POA: Diagnosis not present

## 2019-12-29 DIAGNOSIS — H6983 Other specified disorders of Eustachian tube, bilateral: Secondary | ICD-10-CM | POA: Diagnosis not present

## 2019-12-29 DIAGNOSIS — H903 Sensorineural hearing loss, bilateral: Secondary | ICD-10-CM | POA: Diagnosis not present

## 2020-01-05 DIAGNOSIS — G5730 Lesion of lateral popliteal nerve, unspecified lower limb: Secondary | ICD-10-CM | POA: Diagnosis not present

## 2020-01-19 DIAGNOSIS — Z01419 Encounter for gynecological examination (general) (routine) without abnormal findings: Secondary | ICD-10-CM | POA: Diagnosis not present

## 2020-01-19 DIAGNOSIS — Z1231 Encounter for screening mammogram for malignant neoplasm of breast: Secondary | ICD-10-CM | POA: Diagnosis not present

## 2020-01-19 DIAGNOSIS — F419 Anxiety disorder, unspecified: Secondary | ICD-10-CM | POA: Diagnosis not present

## 2020-01-19 DIAGNOSIS — Z6822 Body mass index (BMI) 22.0-22.9, adult: Secondary | ICD-10-CM | POA: Diagnosis not present

## 2020-01-26 ENCOUNTER — Encounter: Payer: Self-pay | Admitting: Internal Medicine

## 2020-01-26 ENCOUNTER — Other Ambulatory Visit: Payer: Self-pay

## 2020-01-26 ENCOUNTER — Ambulatory Visit: Payer: BC Managed Care – PPO | Admitting: Internal Medicine

## 2020-01-26 VITALS — BP 140/76 | HR 98 | Temp 98.5°F | Ht 66.0 in | Wt 140.0 lb

## 2020-01-26 DIAGNOSIS — J449 Chronic obstructive pulmonary disease, unspecified: Secondary | ICD-10-CM

## 2020-01-26 DIAGNOSIS — Z23 Encounter for immunization: Secondary | ICD-10-CM | POA: Diagnosis not present

## 2020-01-26 DIAGNOSIS — F17200 Nicotine dependence, unspecified, uncomplicated: Secondary | ICD-10-CM

## 2020-01-26 MED ORDER — ADVAIR HFA 230-21 MCG/ACT IN AERO: 2.0000 | INHALATION_SPRAY | Freq: Two times a day (BID) | RESPIRATORY_TRACT | 12 refills | Status: DC

## 2020-01-26 NOTE — Progress Notes (Signed)
Heather Davidson    [id number]    [birth year]  Primary Care Physician:Fagan, Channing Mutters, MD Date of Appointment: 01/26/2020 Established Patient Visit  Chief complaint:   Chief Complaint  Patient presents with  . Follow-up    Husband died in 2024-08-07unexpectedly.  Welbutrin made her a crazy person.  breathing has been pretty good.  Hoarse in the afternoon, using inhaler late afternoon and am.  Advair helping.     HPI: Heather Davidson is a 59 y.o. woman with COPD and ongoing tobacco use.  Interval Updates: Breathing better with advair DPI but still has voice hoarseness. She is gargling but still having this. Her husband had sudden cardiac death in 11-27-22 in the middle of the night. She performed CPR and called EMS but he died. Funeral delayed because his son had covid. Because of the grief and stress has increased her smoking. Has good support with friends and family. Takes care of her grandson every day.   Stopped buproprion due to mental status changes and is back on celexa and much happier. No use of albuterol rescue inhaler. No ED visits, hospitalizations or exacerbations since last visit.   No family history of autoimmune conditions. No arthralgias, fevers, chills, night sweats.   I have reviewed the patient's family social and past medical history and updated as appropriate.   Past Medical History:  Diagnosis Date  . Anxiety   . Clostridium difficile colitis 12/2010  . Depression   . GERD (gastroesophageal reflux disease)   . HTN (hypertension)    physician stopped BP med  . Seasonal allergies     Past Surgical History:  Procedure Laterality Date  . ABDOMINAL HYSTERECTOMY    . CARPAL TUNNEL RELEASE Right 01/07/2018   Procedure: RIGHT CARPAL TUNNEL RELEASE;  Surgeon: Cindee Salt, MD;  Location: [facility];  Service: Orthopedics;  Laterality: Right;  FAB  . CERVICAL CONIZATION W/BX     for precervical cancer in remote past  . CESAREAN SECTION    .  COLONOSCOPY  09/2007   anal papilla, normal colon and terminal ileum  . COLONOSCOPY WITH PROPOFOL N/A 02/10/2018   Procedure: COLONOSCOPY WITH PROPOFOL;  Surgeon: Corbin Ade, MD;  [facility];  Service: Endoscopy;  Laterality: N/A;  1:30pm  . complete hysterectomy    . ESOPHAGOGASTRODUODENOSCOPY  09/2007   solitary inlet patch (bx neg), antral erosions  . TUBAL LIGATION      Family History  Problem Relation Age of Onset  . Lung cancer Father        asbestosis  . COPD Mother        Died 32  . Colon cancer Neg Hx     Social History   Occupational History    Employer: [employer]  Tobacco Use  . Smoking status: Current Every Day Smoker    Packs/day: 1.00    Years: 25.00    Pack years: 25.00    Types: Cigarettes  . Smokeless tobacco: Never Used  . Tobacco comment: smoking 1.5 pack per day  Substance and Sexual Activity  . Alcohol use: Yes    Alcohol/week: 0.0 standard drinks    Comment: 4 glass of red wine a night   . Drug use: No  . Sexual activity: Not on file     Physical Exam: Blood pressure 140/76, pulse 98, temperature 98.5 F (36.9 C), temperature source Oral, height 5\' 6"  (1.676 m), weight 140 lb (63.5  kg), SpO2 100 %.  Gen:      No acute distress Lungs:    No increased respiratory effort, symmetric chest wall excursion, diminished, clear to auscultation bilaterally, no wheezes or crackles CV:         Regular rate and rhythm; no murmurs, rubs, or gallops.  No pedal edema   Data Reviewed: Imaging: I have personally reviewed the CT Chest march 2021 reviewed. Peripheral interstitial changes and some centrilobular emphysema.   PFTs:  PFT Results Latest Ref Rng & Units 10/12/2019  FVC-Pre L 3.98  FVC-Predicted Pre % 111  FVC-Post L 3.91  FVC-Predicted Post % 109  Pre FEV1/FVC % % 77  Post FEV1/FCV % % 80  FEV1-Pre L 3.07  FEV1-Predicted Pre % 111  FEV1-Post L 3.13  DLCO uncorrected ml/min/mmHg 15.78  DLCO UNC% % 72  DLCO corrected  ml/min/mmHg 15.98  DLCO COR %Predicted % 73  DLVA Predicted % 63  TLC L 6.51  TLC % Predicted % 121  RV % Predicted % 121   I have personally reviewed the patient's PFTs and normal spirometry and lung volumes, mildly reduced diffusion capacity.  Labs: Lab Results  Component Value Date   WBC 10.3 10/12/2019   HGB 10.7 (L) 10/12/2019   HCT 32.2 (L) 10/12/2019   MCV 100.6 (H) 10/12/2019   PLT 353.0 10/12/2019     Immunization status: Immunization History  Administered Date(s) Administered  . Influenza, High Dose Seasonal PF 02/01/2019  . Influenza,inj,Quad PF,6+ Mos 01/26/2020  . Moderna SARS-COVID-2 Vaccination 07/28/2019, 11/23/2019  . Pneumococcal Conjugate-13 01/26/2020  . Zoster Recombinat (Shingrix) 02/02/2017, 06/01/2017    Assessment:  Tobacco Use Disorder Abnormal CT Chest - mild bronchiectasis (and ground glass opacities which have improved.) Differential includes smoking related ILD, COPD, less likely HP. This is not a UIP or NSIP pattern ILD.  COPD - new diagnosis Anemia  Plan/Recommendations: Smoking cessation when able.  Will try Advair HFA to see if she has less voice hoarseness.  Repeat Spirometry at the one year mark.  Will repeat a CT scan in March 2022 unless she has a change in symptoms. If this is related to ILD it is mild and we can monitor conservatively for now. No evidence of autoimmune disorders on exam. Will give flu vaccine and pneumonia vaccine. She will need pneumovax at next visit.   Based on CDC guidelines, I recommend Pneumococcal vaccination as follows for all patients with chronic lung disease, smoking history, heart disease, liver disease, diabetes, or alcoholism:  Ages 19-65: 1 dose of PCV13 Pnevnar, followed by 1 dose of PPSV23 Pneumovax at least 8 weeks following.  Repeat dose of PPSV23 Pneumovax should be administered at least 5 years after the first dose.  After age 23: 1 dose of PCV13 Prevnar, if not previously received, and  another dose of PPSV23 Pneumovax at least 1 year after PCV13 and at least 5 years after PPSV23 Pneumovax.   Return to Care: Return in about 4 months (around 05/27/2020).   Durel Salts, MD Pulmonary and Critical Care Medicine [facility] Office:[phone]

## 2020-01-26 NOTE — Patient Instructions (Addendum)
The patient should have follow up scheduled with myself in 4 months.    Switch from Advair disc to MDI. We will repeat your PFT and a CT scan in the spring.

## 2020-01-29 NOTE — Addendum Note (Signed)
Addended by: Delrae Rend on: 01/29/2020 12:17 PM   Modules accepted: Orders

## 2020-01-30 ENCOUNTER — Ambulatory Visit: Payer: BC Managed Care – PPO | Admitting: Internal Medicine

## 2020-02-13 DIAGNOSIS — S301XXA Contusion of abdominal wall, initial encounter: Secondary | ICD-10-CM | POA: Diagnosis not present

## 2020-03-13 DIAGNOSIS — Z03818 Encounter for observation for suspected exposure to other biological agents ruled out: Secondary | ICD-10-CM | POA: Diagnosis not present

## 2020-03-13 DIAGNOSIS — Z20822 Contact with and (suspected) exposure to covid-19: Secondary | ICD-10-CM | POA: Diagnosis not present

## 2020-03-27 DIAGNOSIS — J441 Chronic obstructive pulmonary disease with (acute) exacerbation: Secondary | ICD-10-CM | POA: Diagnosis not present

## 2020-03-27 DIAGNOSIS — I10 Essential (primary) hypertension: Secondary | ICD-10-CM | POA: Diagnosis not present

## 2020-04-12 DIAGNOSIS — H903 Sensorineural hearing loss, bilateral: Secondary | ICD-10-CM | POA: Diagnosis not present

## 2020-04-12 DIAGNOSIS — H6983 Other specified disorders of Eustachian tube, bilateral: Secondary | ICD-10-CM | POA: Diagnosis not present

## 2020-04-17 DIAGNOSIS — I10 Essential (primary) hypertension: Secondary | ICD-10-CM | POA: Diagnosis not present

## 2020-04-17 DIAGNOSIS — G5730 Lesion of lateral popliteal nerve, unspecified lower limb: Secondary | ICD-10-CM | POA: Diagnosis not present

## 2020-05-15 ENCOUNTER — Other Ambulatory Visit: Payer: Self-pay | Admitting: Orthopedic Surgery

## 2020-05-15 DIAGNOSIS — M5136 Other intervertebral disc degeneration, lumbar region: Secondary | ICD-10-CM

## 2020-05-15 DIAGNOSIS — M5410 Radiculopathy, site unspecified: Secondary | ICD-10-CM

## 2020-06-05 ENCOUNTER — Telehealth: Payer: Self-pay | Admitting: Internal Medicine

## 2020-06-06 ENCOUNTER — Telehealth: Payer: Self-pay | Admitting: Internal Medicine

## 2020-06-06 MED ORDER — MOMETASONE FURO-FORMOTEROL FUM 200-5 MCG/ACT IN AERO
2.0000 | INHALATION_SPRAY | Freq: Two times a day (BID) | RESPIRATORY_TRACT | 5 refills | Status: DC
Start: 2020-06-06 — End: 2020-09-02

## 2020-06-06 MED ORDER — BUDESONIDE-FORMOTEROL FUMARATE 160-4.5 MCG/ACT IN AERO
2.0000 | INHALATION_SPRAY | Freq: Two times a day (BID) | RESPIRATORY_TRACT | 6 refills | Status: DC
Start: 2020-06-06 — End: 2020-06-06

## 2020-06-06 NOTE — Telephone Encounter (Signed)
Spoke with pt and advised that [facility] rx was sent to pharmacy. Pt states that she will stick with this for now even though it was over $200 with a coupon. Pt scheduled for 4 mth f/u with Dr Celine Mans. Nothing further needed.

## 2020-06-06 NOTE — Telephone Encounter (Signed)
Spoke with pt. She states that Symbicort that was called in was over $300 and her insurance will not accept the generic. She was told by pharmacy that a Tier 1 would be the cheapest option but pt doesn't think any of the alternative to Advair or Symbicort are tier 1. Pt would like to try Dulera to see if it is any cheaper. Please advise if ok to send in [facility] rx.

## 2020-06-06 NOTE — Telephone Encounter (Signed)
Patient is not aware of covered alternatives. We will try generic symbicort 160.

## 2020-06-06 NOTE — Telephone Encounter (Signed)
Sent!

## 2020-06-06 NOTE — Telephone Encounter (Signed)
Sure. I will send in Dulera 200 as alternative for Advair/Symbicort.

## 2020-06-20 ENCOUNTER — Other Ambulatory Visit: Payer: Self-pay

## 2020-06-20 ENCOUNTER — Ambulatory Visit: Payer: BC Managed Care – PPO | Admitting: Internal Medicine

## 2020-06-20 ENCOUNTER — Encounter: Payer: Self-pay | Admitting: Internal Medicine

## 2020-06-20 VITALS — BP 132/72 | HR 87 | Temp 97.1°F | Ht 66.0 in | Wt 154.1 lb

## 2020-06-20 DIAGNOSIS — Z23 Encounter for immunization: Secondary | ICD-10-CM

## 2020-06-20 DIAGNOSIS — J410 Simple chronic bronchitis: Secondary | ICD-10-CM | POA: Diagnosis not present

## 2020-06-20 DIAGNOSIS — J849 Interstitial pulmonary disease, unspecified: Secondary | ICD-10-CM

## 2020-06-20 NOTE — Patient Instructions (Signed)
The patient should have follow up scheduled with myself in 3 months.   Prior to next visit patient should have: Spirometry  CT scan

## 2020-06-20 NOTE — Progress Notes (Signed)
Heather Davidson    [id number]    [birth year]  Primary Care Physician:Fagan, Channing Mutters, MD Date of Appointment: 06/20/2020 Established Patient Visit  Chief complaint:   Chief Complaint  Patient presents with  . Follow-up    Pt stated that she is doing well but she cannot afford the inhaler.  She stated that all of the ones that were sent in were over $300.      HPI: Heather Davidson is a 60 y.o. woman with COPD and ongoing tobacco use.   Interval Updates: Currently off all inhalers due to cost - new high deductible health plan.  Used albuterol once when she was vacuuming dog hair.   No ED visits or hospital visits for breathing problems.   She got a dog back in November - takes him on walks Keeping her grandson in the afternoons. Thinks she could be more active.   Still smoking about a pack a day.   I have reviewed the patient's family social and past medical history and updated as appropriate.   Past Medical History:  Diagnosis Date  . Anxiety   . Clostridium difficile colitis 12/2010  . Depression   . GERD (gastroesophageal reflux disease)   . HTN (hypertension)    physician stopped BP med  . Seasonal allergies     Past Surgical History:  Procedure Laterality Date  . ABDOMINAL HYSTERECTOMY    . CARPAL TUNNEL RELEASE Right 01/07/2018   Procedure: RIGHT CARPAL TUNNEL RELEASE;  Surgeon: Cindee Salt, MD;  Location: [facility];  Service: Orthopedics;  Laterality: Right;  FAB  . CERVICAL CONIZATION W/BX     for precervical cancer in remote past  . CESAREAN SECTION    . COLONOSCOPY  09/2007   anal papilla, normal colon and terminal ileum  . COLONOSCOPY WITH PROPOFOL N/A 02/10/2018   Procedure: COLONOSCOPY WITH PROPOFOL;  Surgeon: Corbin Ade, MD;  [facility];  Service: Endoscopy;  Laterality: N/A;  1:30pm  . complete hysterectomy    . ESOPHAGOGASTRODUODENOSCOPY  09/2007   solitary inlet patch (bx neg), antral erosions  . TUBAL  LIGATION      Family History  Problem Relation Age of Onset  . Lung cancer Father        asbestosis  . COPD Mother        Died 88  . Colon cancer Neg Hx     Social History   Occupational History    Employer: [employer]  Tobacco Use  . Smoking status: Current Every Day Smoker    Packs/day: 1.00    Years: 25.00    Pack years: 25.00    Types: Cigarettes  . Smokeless tobacco: Never Used  . Tobacco comment: smoking 1.5 pack per day  Substance and Sexual Activity  . Alcohol use: Yes    Alcohol/week: 0.0 standard drinks    Comment: 4 glass of red wine a night   . Drug use: No  . Sexual activity: Not on file     Physical Exam: Blood pressure 132/72, pulse 87, temperature (!) 97.1 F (36.2 C), temperature source Tympanic, height 5\' 6"  (1.676 m), weight 154 lb 2 oz (69.9 kg), SpO2 96 %.  Gen:      No acute distress Lungs:    No wheeze or crackles, no increased work of breathing.  CV:         RRR, no mrg   Data Reviewed: Imaging: I have personally  reviewed the CT Chest march 2021 reviewed. Peripheral interstitial changes and some centrilobular emphysema.   PFTs:  PFT Results Latest Ref Rng & Units 10/12/2019  FVC-Pre L 3.98  FVC-Predicted Pre % 111  FVC-Post L 3.91  FVC-Predicted Post % 109  Pre FEV1/FVC % % 77  Post FEV1/FCV % % 80  FEV1-Pre L 3.07  FEV1-Predicted Pre % 111  FEV1-Post L 3.13  DLCO uncorrected ml/min/mmHg 15.78  DLCO UNC% % 72  DLCO corrected ml/min/mmHg 15.98  DLCO COR %Predicted % 73  DLVA Predicted % 63  TLC L 6.51  TLC % Predicted % 121  RV % Predicted % 121   I have personally reviewed the patient's PFTs and normal spirometry and lung volumes, mildly reduced diffusion capacity.  Labs: Lab Results  Component Value Date   WBC 10.3 10/12/2019   HGB 10.7 (L) 10/12/2019   HCT 32.2 (L) 10/12/2019   MCV 100.6 (H) 10/12/2019   PLT 353.0 10/12/2019     Immunization status: Immunization History  Administered Date(s) Administered  .  Influenza Inj Mdck Quad Pf 02/09/2019  . Influenza Split 02/16/2014  . Influenza, High Dose Seasonal PF 02/01/2019  . Influenza,inj,Quad PF,6+ Mos 04/09/2017, 01/26/2020  . Moderna Sars-Covid-2 Vaccination 07/28/2019, 11/23/2019  . Pneumococcal Conjugate-13 01/26/2020  . Pneumococcal Polysaccharide-23 06/20/2020  . Tdap 05/30/2013  . Zoster Recombinat (Shingrix) 02/02/2017, 06/01/2017    Assessment:  Tobacco Use Disorder - ongoing Abnormal CT Chest - mild bronchiectasis (and ground glass opacities which have improved.) Differential includes smoking related ILD, COPD, less likely HP. This is not a UIP or NSIP pattern ILD.  COPD - stable   Plan/Recommendations: Will repeat CT scan and spirometry to follow up on the abnormal CT Chest. Differential includes ILD, including smoking related RB-ILD  She is off all inhalers for COPD at this time due to cost. She will let me know if breathing gets worse.   Pneumovax today.   Smoking Cessation Counseling:  1. The patient is an everyday smoker and symptomatic due to the following condition COPD 2. The patient is currently contemplative in quitting smoking. 3. I advised patient to quit smoking. 4. We identified patient specific barriers to change.  5. I personally spent 3 minutes counseling the patient regarding tobacco use disorder. 6. We discussed management of stress and anxiety to help with smoking cessation, when applicable. 7. We discussed nicotine replacement therapy, Wellbutrin as possible options. 8. I advised setting a quit date. 9. Follow?up arranged with our office to continue ongoing discussions. 10.Resources given to patient including quit hotline.    Return to Care: Return in about 3 months (around 09/17/2020).   Durel Salts, MD Pulmonary and Critical Care Medicine [facility] Office:[phone]

## 2020-07-26 DIAGNOSIS — F32A Depression, unspecified: Secondary | ICD-10-CM | POA: Diagnosis not present

## 2020-07-26 DIAGNOSIS — I10 Essential (primary) hypertension: Secondary | ICD-10-CM | POA: Diagnosis not present

## 2020-07-30 ENCOUNTER — Telehealth: Payer: Self-pay | Admitting: Internal Medicine

## 2020-07-30 NOTE — Telephone Encounter (Signed)
Called number provided but it stated it was for pre certification. Patient is scheduled for a high res CT later in April. Will route to the PCCs.

## 2020-08-16 ENCOUNTER — Ambulatory Visit (INDEPENDENT_AMBULATORY_CARE_PROVIDER_SITE_OTHER): Payer: BC Managed Care – PPO | Admitting: Psychologist

## 2020-08-16 DIAGNOSIS — F320 Major depressive disorder, single episode, mild: Secondary | ICD-10-CM | POA: Diagnosis not present

## 2020-08-26 DIAGNOSIS — F330 Major depressive disorder, recurrent, mild: Secondary | ICD-10-CM | POA: Diagnosis not present

## 2020-08-27 ENCOUNTER — Ambulatory Visit (HOSPITAL_COMMUNITY)
Admission: RE | Admit: 2020-08-27 | Discharge: 2020-08-27 | Disposition: A | Discharge status: Home / Self Care | Payer: BC Managed Care – PPO | Source: Ambulatory Visit | Attending: Internal Medicine | Admitting: Internal Medicine

## 2020-08-27 DIAGNOSIS — J410 Simple chronic bronchitis: Secondary | ICD-10-CM

## 2020-08-27 DIAGNOSIS — J84112 Idiopathic pulmonary fibrosis: Secondary | ICD-10-CM | POA: Diagnosis not present

## 2020-08-27 DIAGNOSIS — J849 Interstitial pulmonary disease, unspecified: Secondary | ICD-10-CM | POA: Diagnosis not present

## 2020-08-27 DIAGNOSIS — J479 Bronchiectasis, uncomplicated: Secondary | ICD-10-CM | POA: Diagnosis not present

## 2020-08-27 DIAGNOSIS — M47819 Spondylosis without myelopathy or radiculopathy, site unspecified: Secondary | ICD-10-CM | POA: Diagnosis not present

## 2020-08-27 DIAGNOSIS — I2510 Atherosclerotic heart disease of native coronary artery without angina pectoris: Secondary | ICD-10-CM | POA: Diagnosis not present

## 2020-08-28 ENCOUNTER — Ambulatory Visit (INDEPENDENT_AMBULATORY_CARE_PROVIDER_SITE_OTHER): Payer: BC Managed Care – PPO | Admitting: Psychologist

## 2020-08-28 DIAGNOSIS — F320 Major depressive disorder, single episode, mild: Secondary | ICD-10-CM | POA: Diagnosis not present

## 2020-08-30 ENCOUNTER — Other Ambulatory Visit (HOSPITAL_COMMUNITY)
Admission: RE | Admit: 2020-08-30 | Discharge: 2020-08-30 | Disposition: A | Discharge status: Home / Self Care | Payer: BC Managed Care – PPO | Source: Ambulatory Visit | Attending: Internal Medicine | Admitting: Internal Medicine

## 2020-08-30 DIAGNOSIS — Z20822 Contact with and (suspected) exposure to covid-19: Secondary | ICD-10-CM | POA: Diagnosis not present

## 2020-08-30 DIAGNOSIS — Z01812 Encounter for preprocedural laboratory examination: Secondary | ICD-10-CM | POA: Diagnosis not present

## 2020-08-31 LAB — SARS CORONAVIRUS 2 (TAT 6-24 HRS): SARS Coronavirus 2: NEGATIVE

## 2020-09-02 ENCOUNTER — Encounter: Payer: Self-pay | Admitting: Internal Medicine

## 2020-09-02 ENCOUNTER — Other Ambulatory Visit: Payer: Self-pay

## 2020-09-02 ENCOUNTER — Ambulatory Visit (INDEPENDENT_AMBULATORY_CARE_PROVIDER_SITE_OTHER): Payer: BC Managed Care – PPO | Admitting: Internal Medicine

## 2020-09-02 ENCOUNTER — Ambulatory Visit: Payer: BC Managed Care – PPO | Admitting: Internal Medicine

## 2020-09-02 VITALS — BP 142/70 | HR 90 | Ht 66.0 in | Wt 154.0 lb

## 2020-09-02 DIAGNOSIS — R0600 Dyspnea, unspecified: Secondary | ICD-10-CM | POA: Diagnosis not present

## 2020-09-02 DIAGNOSIS — M792 Neuralgia and neuritis, unspecified: Secondary | ICD-10-CM | POA: Insufficient documentation

## 2020-09-02 DIAGNOSIS — F17210 Nicotine dependence, cigarettes, uncomplicated: Secondary | ICD-10-CM

## 2020-09-02 DIAGNOSIS — J849 Interstitial pulmonary disease, unspecified: Secondary | ICD-10-CM | POA: Diagnosis not present

## 2020-09-02 DIAGNOSIS — J410 Simple chronic bronchitis: Secondary | ICD-10-CM | POA: Diagnosis not present

## 2020-09-02 DIAGNOSIS — F17200 Nicotine dependence, unspecified, uncomplicated: Secondary | ICD-10-CM

## 2020-09-02 DIAGNOSIS — J449 Chronic obstructive pulmonary disease, unspecified: Secondary | ICD-10-CM | POA: Diagnosis not present

## 2020-09-02 DIAGNOSIS — R0609 Other forms of dyspnea: Secondary | ICD-10-CM

## 2020-09-02 LAB — PULMONARY FUNCTION TEST
FEF 25-75 Pre: 3.2 L/sec
FEF2575-%Pred-Pre: 132 %
FEV1-%Pred-Pre: 119 %
FEV1-Pre: 3.26 L
FEV1FVC-%Pred-Pre: 103 %
FEV6-%Pred-Pre: 119 %
FEV6-Pre: 4.06 L
FEV6FVC-%Pred-Pre: 103 %
FVC-%Pred-Pre: 115 %
FVC-Pre: 4.06 L
Pre FEV1/FVC ratio: 80 %
Pre FEV6/FVC Ratio: 100 %

## 2020-09-02 MED ORDER — MOMETASONE FURO-FORMOTEROL FUM 200-5 MCG/ACT IN AERO
2.0000 | INHALATION_SPRAY | Freq: Two times a day (BID) | RESPIRATORY_TRACT | 5 refills | Status: AC
Start: 2020-09-02 — End: ?

## 2020-09-02 NOTE — Patient Instructions (Signed)
Spirometry performed today. 

## 2020-09-02 NOTE — Progress Notes (Signed)
Heather Davidson    [id number]    Mar 01, 1961  Primary Care Physician:Fagan, Channing Mutters, MD Date of Appointment: 09/02/2020 Established Patient Visit  Chief complaint:   Chief Complaint  Patient presents with  . COPD     HPI: Heather Davidson is a 60 y.o. woman with COPD and ongoing tobacco use.   Interval Updates: Still off inhalers Minimal albuterol use.  No ED visits. Here for follow up after CT scan.  Still smoking about 1 ppd. Not sure if she wants to quit.   I have reviewed the patient's family social and past medical history and updated as appropriate.   Past Medical History:  Diagnosis Date  . Anxiety   . Clostridium difficile colitis 12/2010  . Depression   . GERD (gastroesophageal reflux disease)   . HTN (hypertension)    physician stopped BP med  . Seasonal allergies     Past Surgical History:  Procedure Laterality Date  . ABDOMINAL HYSTERECTOMY    . CARPAL TUNNEL RELEASE Right 01/07/2018   Procedure: RIGHT CARPAL TUNNEL RELEASE;  Surgeon: Cindee Salt, MD;  Location: [facility];  Service: Orthopedics;  Laterality: Right;  FAB  . CERVICAL CONIZATION W/BX     for precervical cancer in remote past  . CESAREAN SECTION    . COLONOSCOPY  09/2007   anal papilla, normal colon and terminal ileum  . COLONOSCOPY WITH PROPOFOL N/A 02/10/2018   Procedure: COLONOSCOPY WITH PROPOFOL;  Surgeon: Corbin Ade, MD;  [facility];  Service: Endoscopy;  Laterality: N/A;  1:30pm  . complete hysterectomy    . ESOPHAGOGASTRODUODENOSCOPY  09/2007   solitary inlet patch (bx neg), antral erosions  . TUBAL LIGATION      Family History  Problem Relation Age of Onset  . Lung cancer Father        asbestosis  . COPD Mother        Died 27  . Colon cancer Neg Hx     Social History   Occupational History    Employer: [employer]  Tobacco Use  . Smoking status: Current Every Day Smoker    Packs/day: 1.00    Years: 25.00    Pack years:  25.00    Types: Cigarettes  . Smokeless tobacco: Never Used  . Tobacco comment: smoking 1.5 pack per day  Substance and Sexual Activity  . Alcohol use: Yes    Alcohol/week: 0.0 standard drinks    Comment: 4 glass of red wine a night   . Drug use: No  . Sexual activity: Not on file     Physical Exam: Blood pressure (!) 142/70, pulse 90, height 5\' 6"  (1.676 m), weight 154 lb (69.9 kg), SpO2 99 %.  Gen:      No acute distress, well appearing Lungs:    Clear to auscultation bilaterally, no wheezes or crackles CV:         RRR, no mrg   Data Reviewed: Imaging: I have personally reviewed the CT Chest April 2022  PFTs:  PFT Results Latest Ref Rng & Units 09/02/2020 10/12/2019  FVC-Pre L 4.06 3.98  FVC-Predicted Pre % 115 111  FVC-Post L - 3.91  FVC-Predicted Post % - 109  Pre FEV1/FVC % % 80 77  Post FEV1/FCV % % - 80  FEV1-Pre L 3.26 3.07  FEV1-Predicted Pre % 119 111  FEV1-Post L - 3.13  DLCO uncorrected ml/min/mmHg - 15.78  DLCO UNC% % -  72  DLCO corrected ml/min/mmHg - 15.98  DLCO COR %Predicted % - 73  DLVA Predicted % - 63  TLC L - 6.51  TLC % Predicted % - 121  RV % Predicted % - 121   I have personally reviewed the patient's PFTs and normal spirometry and lung volumes, mildly reduced diffusion capacity. Repeat PFTs 5/2 show stable FVC.   Labs: Lab Results  Component Value Date   WBC 10.3 10/12/2019   HGB 10.7 (L) 10/12/2019   HCT 32.2 (L) 10/12/2019   MCV 100.6 (H) 10/12/2019   PLT 353.0 10/12/2019     Immunization status: Immunization History  Administered Date(s) Administered  . Influenza Inj Mdck Quad Pf 02/09/2019  . Influenza Split 02/16/2014  . Influenza, High Dose Seasonal PF 02/01/2019  . Influenza,inj,Quad PF,6+ Mos 04/09/2017, 01/26/2020  . Moderna Sars-Covid-2 Vaccination 07/28/2019, 11/23/2019  . Pneumococcal Conjugate-13 01/26/2020  . Pneumococcal Polysaccharide-23 06/20/2020  . Tdap 05/30/2013  . Zoster Recombinat (Shingrix) 02/02/2017,  06/01/2017    Assessment:  Tobacco Use Disorder - ongoing Interstitial Lung Disease  COPD - stable, mild   Plan/Recommendations: Reviewed CT Chest together. Changes are stable over the past year, as is lung function. I will have her fill out an ILD packet. 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 Much less likely to be UIP pattern. Fortunately she is relatively asymptomatic. I recommend at least annual PFTs to monitor, and we would get a repeat CT Chest if there has been a change in symptoms exam or lung function.   Start taking dulera 2 puffs bid and prn albuterol - sent to pharmacy.   Smoking Cessation Counseling:  1. The patient is an everyday smoker and symptomatic due to the following condition copd 2. The patient is currently pre-contemplative in quitting smoking. 3. I advised patient to quit smoking. 4. We identified patient specific barriers to change.  5. I personally spent 3 minutes counseling the patient regarding tobacco use disorder. 6. We discussed management of stress and anxiety to help with smoking cessation, when applicable. 7. We discussed nicotine replacement therapy, Wellbutrin, Chantix as possible options. 8. I advised setting a quit date. 9. Follow?up arranged with our office to continue ongoing discussions. 10.Resources given to patient including quit hotline.    Return to Care: Return in about 6 months (around 03/05/2021).   Durel Salts, MD Pulmonary and Critical Care Medicine [facility] Office:[phone]

## 2020-09-02 NOTE — Patient Instructions (Addendum)
The patient should have follow up scheduled with myself in 6 months.   Start taking Dulera inhaler 2 puff twice a day. Gargle after use.   Take the albuterol rescue inhaler every 4 to 6 hours as needed for wheezing or shortness of breath. You can also take it 15 minutes before exercise or exertional activity. Side effects include heart racing or pounding, jitters or anxiety. If you have a history of an irregular heart rhythm, it can make this worse. Can also give some patients a hard time sleeping.  To inhale the aerosol using an inhaler, follow these steps:  1. Remove the protective dust cap from the end of the mouthpiece. If the dust cap was not placed on the mouthpiece, check the mouthpiece for dirt or other objects. Be sure that the canister is fully and firmly inserted in the mouthpiece. 2. If you are using the inhaler for the first time or if you have not used the inhaler in more than 14 days, you will need to prime it. You may also need to prime the inhaler if it has been dropped. Ask your pharmacist or check the manufacturer's information if this happens. To prime the inhaler, shake it well and then press down on the canister 4 times to release 4 sprays into the air, away from your face. Be careful not to get albuterol in your eyes. 3. Shake the inhaler well. 4. Breathe out as completely as possible through your mouth. 4. Hold the canister with the mouthpiece on the bottom, facing you and the canister pointing upward. Place the open end of the mouthpiece into your mouth. Close your lips tightly around the mouthpiece. 6. Breathe in slowly and deeply through the mouthpiece.At the same time, press down once on the container to spray the medication into your mouth. 7. Try to hold your breath for 10 seconds. remove the inhaler, and breathe out slowly. 8. If you were told to use 2 puffs, wait 1 minute and then repeat steps 3-7. 9. Replace the protective cap on the inhaler. 10. Clean your inhaler  regularly. Follow the manufacturer's directions carefully and ask your doctor or pharmacist if you have any questions about cleaning your inhaler.  Check the back of the inhaler to keep track of the total number of doses left on the inhaler.    What are the benefits of quitting smoking? Quitting smoking can lower your chances of getting or dying from heart disease, lung disease, kidney failure, infection, or cancer. It can also lower your chances of getting osteoporosis, a condition that makes your bones weak. Plus, quitting smoking can help your skin look younger and reduce the chances that you will have problems with sex.  Quitting smoking will improve your health no matter how old you are, and no matter how long or how much you have smoked.  What should I do if I want to quit smoking? The letters in the word "START" can help you remember the steps to take: S = Set a quit date. T = Tell family, friends, and the people around you that you plan to quit. A = Anticipate or plan ahead for the tough times you'll face while quitting. R = Remove cigarettes and other tobacco products from your home, car, and work. T = Talk to your doctor about getting help to quit.  How can my doctor or nurse help? Your doctor or nurse can give you advice on the best way to quit. He or she can also  put you in touch with counselors or other people you can call for support. Plus, your doctor or nurse can give you medicines to: ?Reduce your craving for cigarettes ?Reduce the unpleasant symptoms that happen when you stop smoking (called "withdrawal symptoms"). You can also get help from a free phone line (1-800-QUIT-NOW) or go online to [employer].  What are the symptoms of withdrawal? The symptoms include: ?Trouble sleeping ?Being irritable, anxious or restless ?Getting frustrated or angry ?Having trouble thinking clearly  Some people who stop smoking become temporarily depressed. Some people need  treatment for depression, such as counseling or antidepressant medicines. Depressed people might: ?No longer enjoy or care about doing the things they used to like to do ?Feel sad, down, hopeless, nervous, or cranky most of the day, almost every day ?Lose or gain weight ?Sleep too much or too little ?Feel tired or like they have no energy ?Feel guilty or like they are worth nothing ?Forget things or feel confused ?Move and speak more slowly than usual ?Act restless or have trouble staying still ?Think about death or suicide  If you think you might be depressed, see your doctor or nurse. Only someone trained in mental health can tell for sure if you are depressed. If you ever feel like you might hurt yourself, go straight to the nearest emergency department. Or you can call for an ambulance (in the [place] and [place], dial 9-1-1) or call your doctor or nurse right away and tell them it is an emergency. You can also reach the [place] National Suicide Prevention Lifeline at 616 757 3238 or http://hill.com/.  How do medicines help you stop smoking? Different medicines work in different ways: ?Nicotine replacement therapy eases withdrawal and reduces your body's craving for nicotine, the main drug found in cigarettes. There are different forms of nicotine replacement, including skin patches, lozenges, gum, nasal sprays, and "puffers" or inhalers. Many can be bought without a prescription, while others might require one. ?Bupropion is a prescription medicine that reduces your desire to smoke. This medicine is sold under the brand names Zyban and Wellbutrin. It is also available in a generic version, which is cheaper than brand name medicines. ?Varenicline (brand names: Chantix, Champix) is a prescription medicine that reduces withdrawal symptoms and cigarette cravings. If you think you'd like to take varenicline and you have a history of depression, anxiety, or heart disease, discuss this with  your doctor or nurse before taking the medicine. Varenicline can also increase the effects of alcohol in some people. It's a good idea to limit drinking while you're taking it, at least until you know how it affects you.  How does counseling work? Counseling can happen during formal office visits or just over the phone. A counselor can help you: ?Figure out what triggers your smoking and what to do instead ?Overcome cravings ?Figure out what went wrong when you tried to quit before  What works best? Studies show that people have the best luck at quitting if they take medicines to help them quit and work with a Veterinary surgeon. It might also be helpful to combine nicotine replacement with one of the prescription medicines that help people quit. In some cases, it might even make sense to take bupropion and varenicline together.  What about e-cigarettes? Sometimes people wonder if using electronic cigarettes, or "e-cigarettes," might help them quit smoking. Using e-cigarettes is also called "vaping." Doctors do not recommend e-cigarettes in place of medicines and counseling. That's because e-cigarettes still contain nicotine as well as  other substances that might be harmful. It's not clear how they can affect a person's health in the long term.  Will I gain weight if I quit? Yes, you might gain a few pounds. But quitting smoking will have a much more positive effect on your health than weighing a few pounds more. Plus, you can help prevent some weight gain by being more active and eating less. Taking the medicine bupropion might help control weight gain.   What else can I do to improve my chances of quitting? You can: ?Start exercising. ?Stay away from smokers and places that you associate with smoking. If people close to you smoke, ask them to quit with you. ?Keep gum, hard candy, or something to put in your mouth handy. If you get a craving for a cigarette, try one of these instead. ?Don't give up,  even if you start smoking again. It takes most people a few tries before they succeed.  What if I am pregnant and I smoke? If you are pregnant, it's really important for the health of your baby that you quit. Ask your doctor what options you have, and what is safest for your baby

## 2020-09-02 NOTE — Progress Notes (Signed)
Spirometry performed today. 

## 2020-09-12 ENCOUNTER — Ambulatory Visit: Payer: BC Managed Care – PPO | Admitting: Psychologist

## 2020-09-23 ENCOUNTER — Ambulatory Visit (INDEPENDENT_AMBULATORY_CARE_PROVIDER_SITE_OTHER): Payer: BC Managed Care – PPO | Admitting: Psychologist

## 2020-09-23 DIAGNOSIS — F320 Major depressive disorder, single episode, mild: Secondary | ICD-10-CM

## 2020-10-02 DIAGNOSIS — I10 Essential (primary) hypertension: Secondary | ICD-10-CM | POA: Diagnosis not present

## 2020-10-02 DIAGNOSIS — F330 Major depressive disorder, recurrent, mild: Secondary | ICD-10-CM | POA: Diagnosis not present

## 2020-10-25 ENCOUNTER — Ambulatory Visit (INDEPENDENT_AMBULATORY_CARE_PROVIDER_SITE_OTHER): Payer: BC Managed Care – PPO | Admitting: Psychologist

## 2020-10-25 DIAGNOSIS — F320 Major depressive disorder, single episode, mild: Secondary | ICD-10-CM | POA: Diagnosis not present

## 2020-11-15 DIAGNOSIS — F330 Major depressive disorder, recurrent, mild: Secondary | ICD-10-CM | POA: Diagnosis not present

## 2020-11-22 ENCOUNTER — Ambulatory Visit (INDEPENDENT_AMBULATORY_CARE_PROVIDER_SITE_OTHER): Payer: BC Managed Care – PPO | Admitting: Psychologist

## 2020-11-22 DIAGNOSIS — F320 Major depressive disorder, single episode, mild: Secondary | ICD-10-CM | POA: Diagnosis not present

## 2020-12-09 DIAGNOSIS — Z79899 Other long term (current) drug therapy: Secondary | ICD-10-CM | POA: Diagnosis not present

## 2020-12-09 DIAGNOSIS — E785 Hyperlipidemia, unspecified: Secondary | ICD-10-CM | POA: Diagnosis not present

## 2020-12-09 DIAGNOSIS — I10 Essential (primary) hypertension: Secondary | ICD-10-CM | POA: Diagnosis not present

## 2020-12-09 DIAGNOSIS — J439 Emphysema, unspecified: Secondary | ICD-10-CM | POA: Diagnosis not present

## 2020-12-09 DIAGNOSIS — I2510 Atherosclerotic heart disease of native coronary artery without angina pectoris: Secondary | ICD-10-CM | POA: Diagnosis not present

## 2020-12-13 DIAGNOSIS — I700 Atherosclerosis of aorta: Secondary | ICD-10-CM | POA: Diagnosis not present

## 2020-12-13 DIAGNOSIS — R000 Tachycardia, unspecified: Secondary | ICD-10-CM | POA: Diagnosis not present

## 2020-12-13 DIAGNOSIS — I10 Essential (primary) hypertension: Secondary | ICD-10-CM | POA: Diagnosis not present

## 2020-12-13 DIAGNOSIS — Z0001 Encounter for general adult medical examination with abnormal findings: Secondary | ICD-10-CM | POA: Diagnosis not present

## 2020-12-13 DIAGNOSIS — Z6825 Body mass index (BMI) 25.0-25.9, adult: Secondary | ICD-10-CM | POA: Diagnosis not present

## 2020-12-13 DIAGNOSIS — E785 Hyperlipidemia, unspecified: Secondary | ICD-10-CM | POA: Diagnosis not present

## 2021-01-03 DIAGNOSIS — R3129 Other microscopic hematuria: Secondary | ICD-10-CM | POA: Diagnosis not present

## 2021-01-30 ENCOUNTER — Other Ambulatory Visit: Payer: Self-pay | Admitting: Orthopedic Surgery

## 2021-01-30 DIAGNOSIS — M5410 Radiculopathy, site unspecified: Secondary | ICD-10-CM

## 2021-01-30 DIAGNOSIS — M5136 Other intervertebral disc degeneration, lumbar region: Secondary | ICD-10-CM

## 2021-02-06 DIAGNOSIS — Z13820 Encounter for screening for osteoporosis: Secondary | ICD-10-CM | POA: Diagnosis not present

## 2021-02-06 DIAGNOSIS — Z1231 Encounter for screening mammogram for malignant neoplasm of breast: Secondary | ICD-10-CM | POA: Diagnosis not present

## 2021-02-06 DIAGNOSIS — Z01419 Encounter for gynecological examination (general) (routine) without abnormal findings: Secondary | ICD-10-CM | POA: Diagnosis not present

## 2021-02-06 DIAGNOSIS — Z6825 Body mass index (BMI) 25.0-25.9, adult: Secondary | ICD-10-CM | POA: Diagnosis not present

## 2021-04-02 DIAGNOSIS — Z809 Family history of malignant neoplasm, unspecified: Secondary | ICD-10-CM | POA: Diagnosis not present

## 2021-04-11 DIAGNOSIS — R3121 Asymptomatic microscopic hematuria: Secondary | ICD-10-CM | POA: Diagnosis not present

## 2021-04-16 DIAGNOSIS — I10 Essential (primary) hypertension: Secondary | ICD-10-CM | POA: Diagnosis not present

## 2021-04-16 DIAGNOSIS — F330 Major depressive disorder, recurrent, mild: Secondary | ICD-10-CM | POA: Diagnosis not present

## 2021-04-16 DIAGNOSIS — Z23 Encounter for immunization: Secondary | ICD-10-CM | POA: Diagnosis not present

## 2021-07-14 DIAGNOSIS — F32A Depression, unspecified: Secondary | ICD-10-CM | POA: Diagnosis not present

## 2021-07-14 DIAGNOSIS — Z79899 Other long term (current) drug therapy: Secondary | ICD-10-CM | POA: Diagnosis not present

## 2021-07-14 DIAGNOSIS — I10 Essential (primary) hypertension: Secondary | ICD-10-CM | POA: Diagnosis not present

## 2021-07-21 DIAGNOSIS — E871 Hypo-osmolality and hyponatremia: Secondary | ICD-10-CM | POA: Diagnosis not present

## 2021-07-21 DIAGNOSIS — I10 Essential (primary) hypertension: Secondary | ICD-10-CM | POA: Diagnosis not present

## 2021-08-31 ENCOUNTER — Ambulatory Visit (INDEPENDENT_AMBULATORY_CARE_PROVIDER_SITE_OTHER): Payer: BC Managed Care – PPO

## 2021-08-31 ENCOUNTER — Ambulatory Visit
Admission: EM | Admit: 2021-08-31 | Discharge: 2021-08-31 | Disposition: A | Discharge status: Home / Self Care | Payer: BC Managed Care – PPO | Attending: Nurse Practitioner | Admitting: Nurse Practitioner

## 2021-08-31 DIAGNOSIS — S82892A Other fracture of left lower leg, initial encounter for closed fracture: Secondary | ICD-10-CM

## 2021-08-31 DIAGNOSIS — M25572 Pain in left ankle and joints of left foot: Secondary | ICD-10-CM

## 2021-08-31 DIAGNOSIS — S20212A Contusion of left front wall of thorax, initial encounter: Secondary | ICD-10-CM | POA: Diagnosis not present

## 2021-08-31 DIAGNOSIS — M7989 Other specified soft tissue disorders: Secondary | ICD-10-CM | POA: Diagnosis not present

## 2021-08-31 MED ORDER — TRAMADOL HCL 50 MG PO TABS: 50.0000 mg | ORAL_TABLET | Freq: Four times a day (QID) | ORAL | 0 refills | Status: AC | PRN

## 2021-08-31 NOTE — Discharge Instructions (Addendum)
Your x-rays show that you have an ankle fracture.  You will need to follow-up with orthopedics within the next 5 to 7 days for further evaluation. ?Continue RICE therapy, rest, ice, compression, and elevation.  Apply ice for the next 24 to 48 hours.  Apply for 20 minutes, remove for 1 hour, then repeat. ?Call [facility] in [place] for follow-up, [phone]. ? ?

## 2021-08-31 NOTE — ED Provider Notes (Signed)
[facility]    CSN: [id number] Arrival date & time: 08/31/21  1144      History   Chief Complaint Chief Complaint  Patient presents with   Fall    Ancle - Entered by patient   Ankle Pain   Foot Pain    HPI Heather Davidson is a 61 y.o. female.   The patient is a 61 year old female who presents with left ankle pain.  Patient states she fell last evening trying to get her dog, and her shoe got caught on the rug.  States she fell over her coffee table and somehow twisted her ankle.  She also complains of pain to the left rib cage.  Today she presents with swelling to the left ankle.  She also states that she has difficulty ambulating on that ankle.  Pain radiates into the back of her foot and into her toes.  She has had a previous twisting injury of the left ankle previously.  She has been icing left ankle and using a brace that she had previously.  The history is provided by the patient.  Ankle Pain Location:  Ankle Pain details:    Quality:  Throbbing   Timing:  Constant   Progression:  Waxing and waning Ineffective treatments:  Ice and elevation Associated symptoms: swelling   Associated symptoms: no fever, no numbness and no tingling   Foot Pain This is a new problem. The problem has not changed since onset.Exacerbated by: weight bearing. The symptoms are relieved by ice.   Past Medical History:  Diagnosis Date   Anxiety    Clostridium difficile colitis 12/2010   Depression    GERD (gastroesophageal reflux disease)    HTN (hypertension)    physician stopped BP med   Seasonal allergies     Patient Active Problem List   Diagnosis Date Noted   Neuralgia 09/02/2020   Essential (primary) hypertension 04/17/2020   Body mass index (BMI) 22.0-22.9, adult 11/17/2019   Common peroneal neuropathy 11/17/2019   Elevated blood-pressure reading, without diagnosis of hypertension 11/17/2019   Chronic bronchitis (HCC) 10/12/2019   Current smoker 10/12/2019    Arthritis 10/11/2018   Disorder of skin 10/11/2018   Hypertensive disorder 10/11/2018   Irritable bowel syndrome 10/11/2018   Migraine 10/11/2018   Urinary tract infectious disease 10/11/2018   Spondylosis without myelopathy or radiculopathy, cervical region 04/21/2018   Neck pain 03/17/2018   Chronic bilateral low back pain without sciatica 03/17/2018   Encounter for screening colonoscopy 10/27/2017   Failed conscious sedation during procedure 10/27/2017   Dupuytren's contracture of right hand 05/07/2017   Left hand pain 05/07/2017   Osteoarthritis of finger of left hand 05/07/2017   Chronic diarrhea 01/09/2015   GERD (gastroesophageal reflux disease) 09/30/2010   Constipation 09/30/2010    Past Surgical History:  Procedure Laterality Date   ABDOMINAL HYSTERECTOMY     CARPAL TUNNEL RELEASE Right 01/07/2018   Procedure: RIGHT CARPAL TUNNEL RELEASE;  Surgeon: Cindee Salt, MD;  Location: [facility];  Service: Orthopedics;  Laterality: Right;  FAB   CERVICAL CONIZATION W/BX     for precervical cancer in remote past   CESAREAN SECTION     COLONOSCOPY  09/2007   anal papilla, normal colon and terminal ileum   COLONOSCOPY WITH PROPOFOL N/A 02/10/2018   Procedure: COLONOSCOPY WITH PROPOFOL;  Surgeon: Corbin Ade, MD;  [facility];  Service: Endoscopy;  Laterality: N/A;  1:30pm   complete hysterectomy  ESOPHAGOGASTRODUODENOSCOPY  09/2007   solitary inlet patch (bx neg), antral erosions   TUBAL LIGATION      OB History   No obstetric history on file.      Home Medications    Prior to Admission medications   Medication Sig Start Date End Date Taking? Authorizing Provider  traMADol (ULTRAM) 50 MG tablet Take 1 tablet (50 mg total) by mouth every 6 (six) hours as needed. 08/31/21  Yes Leath-Warren, Sadie Haber, NP  albuterol (VENTOLIN HFA) 108 (90 Base) MCG/ACT inhaler Inhale 2 puffs into the lungs every 6 (six) hours as needed for wheezing or shortness  of breath. 08/29/19   Charlott Holler, MD  ALPRAZolam Prudy Feeler) 0.5 MG tablet Take 1 tablet (0.5 mg total) by mouth at bedtime as needed for anxiety. 07/31/19   Vickki Hearing, MD  amLODipine (NORVASC) 5 MG tablet Take 5 mg by mouth at bedtime. 08/21/19   [provider]  aspirin 81 MG tablet Take 81 mg by mouth daily.     [provider]  atorvastatin (LIPITOR) 80 MG tablet Take 80 mg by mouth in the morning.  08/21/19   [provider]  cetirizine (ZYRTEC) 10 MG tablet Take 10 mg by mouth daily.    [provider]  citalopram (CELEXA) 20 MG tablet Take 20 mg by mouth daily.  07/31/19   [provider]  eszopiclone (LUNESTA) 2 MG TABS tablet Take 2 mg by mouth at bedtime. Take immediately before bedtime    [provider]  gabapentin (NEURONTIN) 100 MG capsule TAKE ONE CAPSULE (100MG  TOTAL) BY MOUTH THREE TIMES DAILY 01/30/21   Vickki Hearing, MD  losartan (COZAAR) 100 MG tablet Take 100 mg by mouth daily.    [provider]  methocarbamol (ROBAXIN) 500 MG tablet Take 500 mg by mouth 3 (three) times daily.     [provider]  mometasone-formoterol (DULERA) 200-5 MCG/ACT AERO Inhale 2 puffs into the lungs 2 (two) times daily. 09/02/20   Charlott Holler, MD  montelukast (SINGULAIR) 10 MG tablet Take 10 mg by mouth in the morning. 02/24/12   [provider]  Multiple Vitamin (MULTIVITAMIN) capsule Take 1 capsule by mouth daily.    [provider]  omeprazole (PRILOSEC OTC) 20 MG tablet Take 20 mg by mouth in the morning.     [provider]  pilocarpine (SALAGEN) 5 MG tablet Take 5 mg by mouth in the morning and at bedtime. 08/11/19   [provider]  polyethylene glycol powder (GLYCOLAX/MIRALAX) 17 GM/SCOOP powder Take 17 g by mouth daily as needed for mild constipation or moderate constipation.    [provider]  RESTASIS 0.05 % ophthalmic emulsion Place 1 drop into both eyes 2 (two)  times daily. 03/05/12   [provider]  topiramate (TOPAMAX) 25 MG capsule Take 25 mg by mouth in the morning.    [provider]  venlafaxine XR (EFFEXOR-XR) 75 MG 24 hr capsule Take 75 mg by mouth daily. 08/15/20   [provider]  vitamin B-12 (CYANOCOBALAMIN) 1000 MCG tablet Take 1,000 mcg by mouth daily.    [provider]    Family History Family History  Problem Relation Age of Onset   Lung cancer Father        asbestosis   COPD Mother        Died 56   Colon cancer Neg Hx     Social History Social History   Tobacco Use  Smoking status: Every Day    Packs/day: 1.00    Years: 25.00    Pack years: 25.00    Types: Cigarettes   Smokeless tobacco: Never   Tobacco comments:    smoking 1.5 pack per day  Substance Use Topics   Alcohol use: Yes    Alcohol/week: 0.0 standard drinks    Comment: 4 glass of red wine a night    Drug use: No     Allergies   Bupropion, Ciprofloxacin, and Zithromax [azithromycin]   Review of Systems Review of Systems  Constitutional: Negative.  Negative for fever.  Respiratory:         Pain in left ribcage  Gastrointestinal: Negative.   Musculoskeletal:        Left ankle pain and swelling, left foot pain  Psychiatric/Behavioral: Negative.      Physical Exam Triage Vital Signs ED Triage Vitals  Enc Vitals Group     BP 08/31/21 1315 104/68     Pulse Rate 08/31/21 1315 99     Resp 08/31/21 1315 18     Temp 08/31/21 1315 97.9 F (36.6 C)     Temp Source 08/31/21 1315 Oral     SpO2 08/31/21 1315 97 %     Weight --      Height --      Head Circumference --      Peak Flow --      Pain Score 08/31/21 1313 10     Pain Loc --      Pain Edu? --      Excl. in GC? --    No data found.  Updated Vital Signs BP 104/68 (BP Location: Right Arm)   Pulse 99   Temp 97.9 F (36.6 C) (Oral)   Resp 18   SpO2 97%   Visual Acuity Right Eye Distance:   Left Eye Distance:   Bilateral Distance:     Right Eye Near:   Left Eye Near:    Bilateral Near:     Physical Exam Vitals reviewed.  Constitutional:      Appearance: Normal appearance.  HENT:     Head: Normocephalic.  Cardiovascular:     Rate and Rhythm: Normal rate and regular rhythm.     Pulses: Normal pulses.     Heart sounds: Normal heart sounds.  Pulmonary:     Effort: Pulmonary effort is normal. No respiratory distress.     Breath sounds: Normal breath sounds. No wheezing or rales.  Chest:     Chest wall: Tenderness (left ribcage between ribs 9 and 10) present. No swelling.  Abdominal:     General: Bowel sounds are normal.     Palpations: Abdomen is soft.     Tenderness: There is no abdominal tenderness.  Musculoskeletal:        General: Swelling and tenderness present. No deformity.     Cervical back: Normal range of motion.     Left lower leg: Normal. No swelling. No edema.     Left ankle: Swelling present. Tenderness present over the lateral malleolus and base of 5th metatarsal. Decreased range of motion.     Comments: Unable to perform musculoskeletal testing due to pain and swelling  Skin:    General: Skin is warm and dry.     Capillary Refill: Capillary refill takes less than 2 seconds.  Neurological:     General: No focal deficit present.     Mental Status: She is alert and oriented to person, place, and  time.  Psychiatric:        Mood and Affect: Mood normal.        Behavior: Behavior normal.     UC Treatments / Results  Labs (all labs ordered are listed, but only abnormal results are displayed) Labs Reviewed - No data to display  EKG   Radiology DG Ankle Complete Left  Result Date: 08/31/2021 CLINICAL DATA:  Trauma, fall EXAM: LEFT ANKLE COMPLETE - 3+ VIEW COMPARISON:  None. FINDINGS: There is faint linear lucency in the lateral malleolus in the oblique view. There is marked soft tissue swelling along the anterior and lateral aspect of left ankle. There is no dislocation. IMPRESSION: There  is faint linear lucency in the lateral malleolus suggesting undisplaced fracture. Electronically Signed   By: Ernie Avena M.D.   On: 08/31/2021 13:41    Procedures Procedures (including critical care time)  Medications Ordered in UC Medications - No data to display  Initial Impression / Assessment and Plan / UC Course  I have reviewed the triage vital signs and the nursing notes.  Pertinent labs & imaging results that were available during my care of the patient were reviewed by me and considered in my medical decision making (see chart for details).  The patient is a 61 year old female who presents with left ankle pain status post fall.  Injury occurred 1 day ago.  Patient presents with pain to the lateral aspect of her left ankle and along the left rib cage.  On exam, the patient has moderate swelling to the left lateral malleolus.  She does have difficulty moving her toes.  States the pain does radiate into the foot and into her toes.  She also complains of tenderness to the left rib cage around ribs 9 and 10.  There is no bruising on exam of the ribs.  X-ray shows that she does have a linear lucency that is suggestive of a fracture in the ankle.  We will have the patient follow-up with Ortho care here in [place] for further evaluation.  In the interim, patient was ordered a cam boot, but she states that she has 1 at home that she will use.  RICE therapy was highly recommended to include rest, ice, compression and elevation.  Tramadol was ordered to help with her pain until she is seen by orthopedics.  Patient advised to follow-up as needed. Final Clinical Impressions(s) / UC Diagnoses   Final diagnoses:  Rib contusion, left, initial encounter  Closed fracture of left ankle, initial encounter     Discharge Instructions      Your x-rays show that you have an ankle fracture.  You will need to follow-up with orthopedics within the next 5 to 7 days for further evaluation. Continue  RICE therapy, rest, ice, compression, and elevation.  Apply ice for the next 24 to 48 hours.  Apply for 20 minutes, remove for 1 hour, then repeat. Call [facility] in [place] for follow-up, [phone].      ED Prescriptions     Medication Sig Dispense Auth. Provider   traMADol (ULTRAM) 50 MG tablet Take 1 tablet (50 mg total) by mouth every 6 (six) hours as needed. 10 tablet Leath-Warren, Sadie Haber, NP      PDMP not reviewed this encounter.   Abran Cantor, NP 08/31/21 1626

## 2021-08-31 NOTE — ED Triage Notes (Signed)
Pt reports she tripped and fell when trying to standing up form her sofa, states having pain in the leaf ankle, left foot and left sided rib cage pain after she hit it with a solid wood side table.  ?

## 2021-09-02 ENCOUNTER — Ambulatory Visit: Payer: BC Managed Care – PPO | Admitting: Orthopaedic Surgery

## 2021-09-02 ENCOUNTER — Encounter: Payer: Self-pay | Admitting: Orthopaedic Surgery

## 2021-09-02 VITALS — BP 132/78 | HR 94 | Ht 66.0 in | Wt 154.0 lb

## 2021-09-02 DIAGNOSIS — S8265XA Nondisplaced fracture of lateral malleolus of left fibula, initial encounter for closed fracture: Secondary | ICD-10-CM | POA: Diagnosis not present

## 2021-09-02 MED ORDER — HYDROCODONE-ACETAMINOPHEN 5-325 MG PO TABS: ORAL_TABLET | ORAL | 0 refills | Status: AC

## 2021-09-02 NOTE — Progress Notes (Signed)
I broke my ankle. ? ?She fell at her home in the living room and twisted her left ankle 08-31-21.  She also hit her left chest.  She was seen at Urgent Care and X-rays were done showing nondisplaced fracture left lateral malleolus.  She has a CAM walker.  She is using two canes.  She was given Tramadol which did not help.   ? ?She is patient of Dr. Aline Brochure but I had opening in my schedule today and she accepted.  She would like to follow-up with Dr. Aline Brochure. ? ?Left ankle is tender, lateral swelling but no ecchymosis.  NV intact.  ROM is limited secondary to pain.   ? ?I have independently reviewed and interpreted x-rays of this patient done at another site by another physician or qualified health professional. ?I have reviewed the ER reports as well. ? ?Encounter Diagnosis  ?Name Primary?  ? Nondisplaced fracture of lateral malleolus of left fibula, initial encounter for closed fracture Yes  ? ?I have explained X-rays to her. ? ?I will give Norco for pain. ? ?I have reviewed the [place] web site prior to prescribing narcotic medicine for this patient. ? ?I will give temporary handicap form. ? ?I have given Rx for Walker. ? ?I have given instructions for Contrast Baths. ? ?Elevate, ice.  Continue CAM walker. ? ?Return in two weeks.  See Dr. Aline Brochure per her request. ? ?Call if any problem. ? ?Precautions discussed. ? ?Electronically Signed ?Sanjuana Kava, MD ?5/2/202310:06 AM ? ?

## 2021-09-18 ENCOUNTER — Ambulatory Visit (INDEPENDENT_AMBULATORY_CARE_PROVIDER_SITE_OTHER): Payer: BC Managed Care – PPO

## 2021-09-18 ENCOUNTER — Ambulatory Visit (INDEPENDENT_AMBULATORY_CARE_PROVIDER_SITE_OTHER): Payer: BC Managed Care – PPO | Admitting: Orthopedic Surgery

## 2021-09-18 DIAGNOSIS — S8265XA Nondisplaced fracture of lateral malleolus of left fibula, initial encounter for closed fracture: Secondary | ICD-10-CM

## 2021-09-18 NOTE — Progress Notes (Signed)
FOLLOW UP   Encounter Diagnosis  Name Primary?   Nondisplaced fracture of lateral malleolus of left fibula, initial encounter for closed fracture Yes     Chief Complaint  Patient presents with   Ankle Injury    DOI 08/31/21 Nondisplaced fracture of lateral malleolus of left fibula   Follow-up    Recheck on left ankle, DOS 08-31-21.     Ms. Heather Davidson is doing well she is still sore still has some swelling today's x-ray shows near complete resolution of the transverse distal fibular fracture nondisplaced  She brought in an ASO brace and I think she can wear that until the ankle stops being sore but she will not need any more x-rays and will follow-up on an as-needed basis

## 2021-12-12 DIAGNOSIS — E785 Hyperlipidemia, unspecified: Secondary | ICD-10-CM | POA: Diagnosis not present

## 2021-12-12 DIAGNOSIS — I2510 Atherosclerotic heart disease of native coronary artery without angina pectoris: Secondary | ICD-10-CM | POA: Diagnosis not present

## 2021-12-12 DIAGNOSIS — I700 Atherosclerosis of aorta: Secondary | ICD-10-CM | POA: Diagnosis not present

## 2021-12-12 DIAGNOSIS — J849 Interstitial pulmonary disease, unspecified: Secondary | ICD-10-CM | POA: Diagnosis not present

## 2021-12-12 DIAGNOSIS — I10 Essential (primary) hypertension: Secondary | ICD-10-CM | POA: Diagnosis not present

## 2021-12-19 ENCOUNTER — Other Ambulatory Visit (HOSPITAL_COMMUNITY): Payer: Self-pay | Admitting: Internal Medicine

## 2021-12-19 DIAGNOSIS — I10 Essential (primary) hypertension: Secondary | ICD-10-CM | POA: Diagnosis not present

## 2021-12-19 DIAGNOSIS — I700 Atherosclerosis of aorta: Secondary | ICD-10-CM | POA: Diagnosis not present

## 2021-12-19 DIAGNOSIS — E785 Hyperlipidemia, unspecified: Secondary | ICD-10-CM | POA: Diagnosis not present

## 2021-12-19 DIAGNOSIS — F17200 Nicotine dependence, unspecified, uncomplicated: Secondary | ICD-10-CM

## 2021-12-19 DIAGNOSIS — J849 Interstitial pulmonary disease, unspecified: Secondary | ICD-10-CM | POA: Diagnosis not present

## 2021-12-19 DIAGNOSIS — R000 Tachycardia, unspecified: Secondary | ICD-10-CM | POA: Diagnosis not present

## 2022-01-21 ENCOUNTER — Ambulatory Visit (HOSPITAL_COMMUNITY)
Admission: RE | Admit: 2022-01-21 | Discharge: 2022-01-21 | Disposition: A | Discharge status: Home / Self Care | Payer: BC Managed Care – PPO | Source: Ambulatory Visit | Attending: Internal Medicine | Admitting: Internal Medicine

## 2022-01-21 DIAGNOSIS — F17200 Nicotine dependence, unspecified, uncomplicated: Secondary | ICD-10-CM | POA: Insufficient documentation

## 2022-01-21 DIAGNOSIS — F17210 Nicotine dependence, cigarettes, uncomplicated: Secondary | ICD-10-CM | POA: Diagnosis not present

## 2022-02-09 DIAGNOSIS — Z01419 Encounter for gynecological examination (general) (routine) without abnormal findings: Secondary | ICD-10-CM | POA: Diagnosis not present

## 2022-02-09 DIAGNOSIS — Z1231 Encounter for screening mammogram for malignant neoplasm of breast: Secondary | ICD-10-CM | POA: Diagnosis not present

## 2022-02-09 DIAGNOSIS — Z6827 Body mass index (BMI) 27.0-27.9, adult: Secondary | ICD-10-CM | POA: Diagnosis not present

## 2022-03-18 DIAGNOSIS — I10 Essential (primary) hypertension: Secondary | ICD-10-CM | POA: Diagnosis not present

## 2022-03-18 DIAGNOSIS — J8417 Interstitial lung disease with progressive fibrotic phenotype in diseases classified elsewhere: Secondary | ICD-10-CM | POA: Diagnosis not present

## 2022-07-02 ENCOUNTER — Encounter: Payer: Self-pay | Admitting: Radiology

## 2022-08-11 DIAGNOSIS — I10 Essential (primary) hypertension: Secondary | ICD-10-CM | POA: Diagnosis not present

## 2022-08-11 DIAGNOSIS — J8417 Interstitial lung disease with progressive fibrotic phenotype in diseases classified elsewhere: Secondary | ICD-10-CM | POA: Diagnosis not present

## 2023-01-19 DIAGNOSIS — R7301 Impaired fasting glucose: Secondary | ICD-10-CM | POA: Diagnosis not present

## 2023-01-19 DIAGNOSIS — E785 Hyperlipidemia, unspecified: Secondary | ICD-10-CM | POA: Diagnosis not present

## 2023-01-19 DIAGNOSIS — I10 Essential (primary) hypertension: Secondary | ICD-10-CM | POA: Diagnosis not present

## 2023-01-19 DIAGNOSIS — M5186 Other intervertebral disc disorders, lumbar region: Secondary | ICD-10-CM | POA: Diagnosis not present

## 2023-01-19 DIAGNOSIS — I700 Atherosclerosis of aorta: Secondary | ICD-10-CM | POA: Diagnosis not present

## 2023-01-19 DIAGNOSIS — F329 Major depressive disorder, single episode, unspecified: Secondary | ICD-10-CM | POA: Diagnosis not present

## 2023-01-19 DIAGNOSIS — Z79899 Other long term (current) drug therapy: Secondary | ICD-10-CM | POA: Diagnosis not present

## 2023-01-22 ENCOUNTER — Encounter (HOSPITAL_COMMUNITY): Payer: Self-pay | Admitting: Internal Medicine

## 2023-01-22 ENCOUNTER — Other Ambulatory Visit (HOSPITAL_COMMUNITY): Payer: Self-pay | Admitting: Internal Medicine

## 2023-01-22 DIAGNOSIS — F17200 Nicotine dependence, unspecified, uncomplicated: Secondary | ICD-10-CM

## 2023-01-26 ENCOUNTER — Other Ambulatory Visit: Payer: Self-pay | Admitting: Internal Medicine

## 2023-01-26 DIAGNOSIS — F17200 Nicotine dependence, unspecified, uncomplicated: Secondary | ICD-10-CM

## 2023-01-26 DIAGNOSIS — Z0001 Encounter for general adult medical examination with abnormal findings: Secondary | ICD-10-CM | POA: Diagnosis not present

## 2023-01-26 DIAGNOSIS — R000 Tachycardia, unspecified: Secondary | ICD-10-CM | POA: Diagnosis not present

## 2023-01-26 DIAGNOSIS — Z23 Encounter for immunization: Secondary | ICD-10-CM | POA: Diagnosis not present

## 2023-01-26 DIAGNOSIS — I10 Essential (primary) hypertension: Secondary | ICD-10-CM | POA: Diagnosis not present

## 2023-01-26 DIAGNOSIS — J8417 Interstitial lung disease with progressive fibrotic phenotype in diseases classified elsewhere: Secondary | ICD-10-CM | POA: Diagnosis not present

## 2023-01-26 DIAGNOSIS — I700 Atherosclerosis of aorta: Secondary | ICD-10-CM | POA: Diagnosis not present

## 2023-01-26 DIAGNOSIS — E785 Hyperlipidemia, unspecified: Secondary | ICD-10-CM | POA: Diagnosis not present

## 2023-01-27 ENCOUNTER — Encounter: Payer: Self-pay | Admitting: Internal Medicine

## 2023-02-09 ENCOUNTER — Ambulatory Visit
Admission: RE | Admit: 2023-02-09 | Discharge: 2023-02-09 | Disposition: A | Discharge status: Home / Self Care | Payer: BC Managed Care – PPO | Source: Ambulatory Visit | Attending: Internal Medicine | Admitting: Internal Medicine

## 2023-02-09 DIAGNOSIS — F17200 Nicotine dependence, unspecified, uncomplicated: Secondary | ICD-10-CM

## 2023-02-09 DIAGNOSIS — F17210 Nicotine dependence, cigarettes, uncomplicated: Secondary | ICD-10-CM | POA: Diagnosis not present

## 2023-02-22 DIAGNOSIS — Z6827 Body mass index (BMI) 27.0-27.9, adult: Secondary | ICD-10-CM | POA: Diagnosis not present

## 2023-02-22 DIAGNOSIS — Z1272 Encounter for screening for malignant neoplasm of vagina: Secondary | ICD-10-CM | POA: Diagnosis not present

## 2023-02-22 DIAGNOSIS — Z1231 Encounter for screening mammogram for malignant neoplasm of breast: Secondary | ICD-10-CM | POA: Diagnosis not present

## 2023-02-22 DIAGNOSIS — Z13820 Encounter for screening for osteoporosis: Secondary | ICD-10-CM | POA: Diagnosis not present

## 2023-02-22 DIAGNOSIS — Z01419 Encounter for gynecological examination (general) (routine) without abnormal findings: Secondary | ICD-10-CM | POA: Diagnosis not present

## 2023-03-04 ENCOUNTER — Encounter (HOSPITAL_COMMUNITY): Payer: Self-pay

## 2023-03-04 ENCOUNTER — Ambulatory Visit (HOSPITAL_COMMUNITY): Payer: BC Managed Care – PPO

## 2023-03-23 DIAGNOSIS — J343 Hypertrophy of nasal turbinates: Secondary | ICD-10-CM | POA: Diagnosis not present

## 2023-03-23 DIAGNOSIS — J342 Deviated nasal septum: Secondary | ICD-10-CM | POA: Diagnosis not present

## 2023-03-23 DIAGNOSIS — Z720 Tobacco use: Secondary | ICD-10-CM | POA: Diagnosis not present

## 2023-03-23 DIAGNOSIS — F1010 Alcohol abuse, uncomplicated: Secondary | ICD-10-CM | POA: Diagnosis not present

## 2023-07-29 DIAGNOSIS — J8417 Interstitial lung disease with progressive fibrotic phenotype in diseases classified elsewhere: Secondary | ICD-10-CM | POA: Diagnosis not present

## 2023-07-29 DIAGNOSIS — I10 Essential (primary) hypertension: Secondary | ICD-10-CM | POA: Diagnosis not present

## 2023-07-29 DIAGNOSIS — F330 Major depressive disorder, recurrent, mild: Secondary | ICD-10-CM | POA: Diagnosis not present

## 2023-12-24 ENCOUNTER — Encounter: Payer: Self-pay | Admitting: Radiology

## 2024-02-03 DIAGNOSIS — E785 Hyperlipidemia, unspecified: Secondary | ICD-10-CM | POA: Diagnosis not present

## 2024-02-03 DIAGNOSIS — I700 Atherosclerosis of aorta: Secondary | ICD-10-CM | POA: Diagnosis not present

## 2024-02-03 DIAGNOSIS — R7301 Impaired fasting glucose: Secondary | ICD-10-CM | POA: Diagnosis not present

## 2024-02-03 DIAGNOSIS — Z79899 Other long term (current) drug therapy: Secondary | ICD-10-CM | POA: Diagnosis not present

## 2024-02-03 DIAGNOSIS — F329 Major depressive disorder, single episode, unspecified: Secondary | ICD-10-CM | POA: Diagnosis not present

## 2024-02-10 DIAGNOSIS — F330 Major depressive disorder, recurrent, mild: Secondary | ICD-10-CM | POA: Diagnosis not present

## 2024-02-10 DIAGNOSIS — F1010 Alcohol abuse, uncomplicated: Secondary | ICD-10-CM | POA: Diagnosis not present

## 2024-02-10 DIAGNOSIS — I10 Essential (primary) hypertension: Secondary | ICD-10-CM | POA: Diagnosis not present

## 2024-02-10 DIAGNOSIS — Z23 Encounter for immunization: Secondary | ICD-10-CM | POA: Diagnosis not present

## 2024-02-10 DIAGNOSIS — I700 Atherosclerosis of aorta: Secondary | ICD-10-CM | POA: Diagnosis not present

## 2024-02-10 DIAGNOSIS — J438 Other emphysema: Secondary | ICD-10-CM | POA: Diagnosis not present

## 2024-02-10 DIAGNOSIS — Z0001 Encounter for general adult medical examination with abnormal findings: Secondary | ICD-10-CM | POA: Diagnosis not present

## 2024-02-10 DIAGNOSIS — E785 Hyperlipidemia, unspecified: Secondary | ICD-10-CM | POA: Diagnosis not present

## 2024-02-10 DIAGNOSIS — J8410 Pulmonary fibrosis, unspecified: Secondary | ICD-10-CM | POA: Diagnosis not present

## 2024-02-23 DIAGNOSIS — Z1231 Encounter for screening mammogram for malignant neoplasm of breast: Secondary | ICD-10-CM | POA: Diagnosis not present

## 2024-02-23 DIAGNOSIS — Z1272 Encounter for screening for malignant neoplasm of vagina: Secondary | ICD-10-CM | POA: Diagnosis not present

## 2024-02-23 DIAGNOSIS — Z01419 Encounter for gynecological examination (general) (routine) without abnormal findings: Secondary | ICD-10-CM | POA: Diagnosis not present

## 2024-02-23 DIAGNOSIS — Z6828 Body mass index (BMI) 28.0-28.9, adult: Secondary | ICD-10-CM | POA: Diagnosis not present

## 2024-03-06 ENCOUNTER — Encounter: Payer: Self-pay | Admitting: Radiology

## 2024-03-14 ENCOUNTER — Other Ambulatory Visit: Payer: Self-pay | Admitting: Internal Medicine

## 2024-03-14 DIAGNOSIS — J849 Interstitial pulmonary disease, unspecified: Secondary | ICD-10-CM

## 2024-03-15 ENCOUNTER — Encounter: Payer: Self-pay | Admitting: Internal Medicine

## 2024-03-21 ENCOUNTER — Inpatient Hospital Stay
Admission: RE | Admit: 2024-03-21 | Discharge: 2024-03-21 | Disposition: A | Source: Ambulatory Visit | Attending: Internal Medicine | Admitting: Internal Medicine

## 2024-03-21 DIAGNOSIS — F17210 Nicotine dependence, cigarettes, uncomplicated: Secondary | ICD-10-CM | POA: Diagnosis not present

## 2024-03-21 DIAGNOSIS — J849 Interstitial pulmonary disease, unspecified: Secondary | ICD-10-CM
